# Patient Record
Sex: Male | Born: 1935
Health system: Southern US, Community
[De-identification: ages and names within clinical notes are randomized; demographics above are authoritative.]

## PROBLEM LIST (undated history)

## (undated) DIAGNOSIS — K635 Polyp of colon: Secondary | ICD-10-CM

## (undated) DIAGNOSIS — K5790 Diverticulosis of intestine, part unspecified, without perforation or abscess without bleeding: Secondary | ICD-10-CM

## (undated) DIAGNOSIS — K621 Rectal polyp: Secondary | ICD-10-CM

## (undated) DIAGNOSIS — I10 Essential (primary) hypertension: Secondary | ICD-10-CM

## (undated) DIAGNOSIS — R001 Bradycardia, unspecified: Secondary | ICD-10-CM

## (undated) DIAGNOSIS — K219 Gastro-esophageal reflux disease without esophagitis: Secondary | ICD-10-CM

## (undated) DIAGNOSIS — T7840XA Allergy, unspecified, initial encounter: Secondary | ICD-10-CM

## (undated) HISTORY — PX: CATARACT EXTRACTION: SUR2

## (undated) HISTORY — PX: TONSILECTOMY, ADENOIDECTOMY, BILATERAL MYRINGOTOMY AND TUBES: SHX2538

## (undated) HISTORY — DX: Allergy, unspecified, initial encounter: T78.40XA

---

## 2006-03-05 ENCOUNTER — Ambulatory Visit: Payer: Self-pay | Admitting: Unknown Physician Specialty

## 2008-04-07 ENCOUNTER — Emergency Department: Payer: Self-pay | Admitting: Emergency Medicine

## 2010-03-01 ENCOUNTER — Ambulatory Visit: Payer: Self-pay | Admitting: Unknown Physician Specialty

## 2010-05-17 ENCOUNTER — Ambulatory Visit: Payer: Self-pay | Admitting: Unknown Physician Specialty

## 2010-07-03 ENCOUNTER — Ambulatory Visit: Payer: Self-pay | Admitting: Ophthalmology

## 2010-07-16 ENCOUNTER — Ambulatory Visit: Payer: Self-pay | Admitting: Ophthalmology

## 2010-07-30 ENCOUNTER — Ambulatory Visit: Payer: Self-pay | Admitting: Unknown Physician Specialty

## 2010-08-19 ENCOUNTER — Ambulatory Visit: Payer: Self-pay | Admitting: Unknown Physician Specialty

## 2010-12-24 ENCOUNTER — Ambulatory Visit: Payer: Self-pay | Admitting: Unknown Physician Specialty

## 2010-12-25 ENCOUNTER — Ambulatory Visit: Payer: Self-pay | Admitting: Unknown Physician Specialty

## 2014-06-23 DIAGNOSIS — I10 Essential (primary) hypertension: Secondary | ICD-10-CM | POA: Insufficient documentation

## 2014-06-23 DIAGNOSIS — G4733 Obstructive sleep apnea (adult) (pediatric): Secondary | ICD-10-CM | POA: Insufficient documentation

## 2014-06-23 DIAGNOSIS — N400 Enlarged prostate without lower urinary tract symptoms: Secondary | ICD-10-CM | POA: Insufficient documentation

## 2014-06-23 DIAGNOSIS — Z9989 Dependence on other enabling machines and devices: Secondary | ICD-10-CM

## 2014-06-23 DIAGNOSIS — K219 Gastro-esophageal reflux disease without esophagitis: Secondary | ICD-10-CM | POA: Insufficient documentation

## 2014-06-23 HISTORY — DX: Essential (primary) hypertension: I10

## 2014-11-14 ENCOUNTER — Ambulatory Visit: Payer: Self-pay | Admitting: Ophthalmology

## 2014-11-21 ENCOUNTER — Ambulatory Visit: Payer: Self-pay | Admitting: Ophthalmology

## 2015-03-05 NOTE — Op Note (Signed)
PATIENT NAME:  Michael Gilbert, Michael Gilbert MR#:  [id number] DATE OF BIRTH:  [birth year]  DATE OF PROCEDURE:  11/21/2014  PREOPERATIVE DIAGNOSIS:  Cataract, right eye.   POSTOPERATIVE DIAGNOSIS:  Cataract, right eye.  PROCEDURE PERFORMED:  Extracapsular cataract extraction using phacoemulsification with placement of an Alcon SN60WF, 21.0-diopter posterior chamber lens, serial # [id number].  SURGEON:  Robson Trickey. Rondia Higginbotham, MD  ASSISTANT:  None.  ANESTHESIA:  4% lidocaine and 0.75% Marcaine in a 50/50 mixture with 10 units per mL of Hylenex added, given as a peribulbar.  ANESTHESIOLOGIST:  Amy M. Rice, MD  COMPLICATIONS:  None.  ESTIMATED BLOOD LOSS:  Less than 1 ml.  DESCRIPTION OF PROCEDURE:  The patient was brought to the operating room and given a peribulbar block.  The patient was then prepped and draped in the usual fashion.  The vertical rectus muscles were imbricated using 5-0 silk sutures.  These sutures were then clamped to the sterile drapes as bridle sutures.  A limbal peritomy was performed extending two clock hours and hemostasis was obtained with cautery.  A partial thickness scleral groove was made at the surgical limbus and dissected anteriorly in a lamellar dissection using an Alcon crescent knife.  The anterior chamber was entered superonasally with a Superblade and through the lamellar dissection with a 2.6 mm keratome.  DisCoVisc was used to replace the aqueous and a continuous tear capsulorrhexis was carried out.  Hydrodissection and hydrodelineation were carried out with balanced salt and a 27 gauge canula.  The nucleus was rotated to confirm the effectiveness of the hydrodissection.  Phacoemulsification was carried out using a divide-and-conquer technique.  Total ultrasound time was 2 minutes and 7 seconds with an average power of 25.9% percent. CDE of 54.42.  Irrigation/aspiration was used to remove the residual cortex.  DisCoVisc was used to inflate the capsule and the internal  incision was enlarged to 3 mm with the crescent knife.  The intraocular lens was folded and inserted into the capsular bag using the AcrySert delivery system.  Irrigation/aspiration was used to remove the residual DisCoVisc.  Miostat was injected into the anterior chamber through the paracentesis tract to inflate the anterior chamber and induce miosis.  Cefuroxime 0.1 mL containing 1 mg of drug was injected via the paracentesis tract. The wound was checked for leaks and none were found. The conjunctiva was closed with cautery and the bridle sutures were removed.  Two drops of 0.3% Vigamox were placed on the eye.   An eye shield was placed on the eye.  The patient was discharged to the recovery room in good condition.   ____________________________ Loura Back Garnetta Fedrick, MD sad:MT D: 11/21/2014 13:06:49 ET T: 11/21/2014 14:11:25 ET JOB#: [id number]  cc: Remo Lipps A. Demetric Parslow, MD, <Dictator> Martie Lee MD ELECTRONICALLY SIGNED 11/28/2014 13:11

## 2016-05-18 ENCOUNTER — Emergency Department
Admission: EM | Admit: 2016-05-18 | Discharge: 2016-05-18 | Disposition: A | Discharge status: Home / Self Care | Payer: Medicare Other | Attending: Emergency Medicine | Admitting: Emergency Medicine

## 2016-05-18 ENCOUNTER — Emergency Department: Payer: Medicare Other

## 2016-05-18 ENCOUNTER — Encounter: Payer: Self-pay | Admitting: Emergency Medicine

## 2016-05-18 DIAGNOSIS — R008 Other abnormalities of heart beat: Secondary | ICD-10-CM | POA: Insufficient documentation

## 2016-05-18 DIAGNOSIS — I10 Essential (primary) hypertension: Secondary | ICD-10-CM | POA: Diagnosis not present

## 2016-05-18 DIAGNOSIS — Z87891 Personal history of nicotine dependence: Secondary | ICD-10-CM | POA: Insufficient documentation

## 2016-05-18 DIAGNOSIS — R001 Bradycardia, unspecified: Secondary | ICD-10-CM | POA: Diagnosis present

## 2016-05-18 DIAGNOSIS — I498 Other specified cardiac arrhythmias: Secondary | ICD-10-CM

## 2016-05-18 DIAGNOSIS — I499 Cardiac arrhythmia, unspecified: Secondary | ICD-10-CM

## 2016-05-18 HISTORY — DX: Essential (primary) hypertension: I10

## 2016-05-18 LAB — CBC
HEMATOCRIT: 40.2 % (ref 40.0–52.0)
HEMOGLOBIN: 13.9 g/dL (ref 13.0–18.0)
MCH: 30.3 pg (ref 26.0–34.0)
MCHC: 34.7 g/dL (ref 32.0–36.0)
MCV: 87.4 fL (ref 80.0–100.0)
Platelets: 153 10*3/uL (ref 150–440)
RBC: 4.59 MIL/uL (ref 4.40–5.90)
RDW: 14 % (ref 11.5–14.5)
WBC: 7.9 10*3/uL (ref 3.8–10.6)

## 2016-05-18 LAB — BASIC METABOLIC PANEL
Anion gap: 9 (ref 5–15)
BUN: 31 mg/dL — ABNORMAL HIGH (ref 6–20)
CHLORIDE: 103 mmol/L (ref 101–111)
CO2: 20 mmol/L — AB (ref 22–32)
CREATININE: 1.59 mg/dL — AB (ref 0.61–1.24)
Calcium: 8.8 mg/dL — ABNORMAL LOW (ref 8.9–10.3)
GFR calc non Af Amer: 38 mL/min — ABNORMAL LOW (ref >60)
GFR, EST AFRICAN AMERICAN: 44 mL/min — AB (ref >60)
Glucose, Bld: 109 mg/dL — ABNORMAL HIGH (ref 65–99)
POTASSIUM: 4 mmol/L (ref 3.5–5.1)
SODIUM: 132 mmol/L — AB (ref 135–145)

## 2016-05-18 LAB — MAGNESIUM: MAGNESIUM: 2.1 mg/dL (ref 1.7–2.4)

## 2016-05-18 LAB — TROPONIN I

## 2016-05-18 NOTE — Discharge Instructions (Signed)
Follow-up with cardiology at [facility] Monday at [place] this week. It appears that you have a irregular heart rhythm called Bigeminy.   Bradycardia Bradycardia is a slower-than-normal heart rate. A normal resting heart rate for an adult ranges from 60 to 100 beats per minute. With bradycardia, the resting heart rate is less than 60 beats per minute. Bradycardia is a problem if your heart cannot pump enough oxygen-rich blood through your body. Bradycardia is not a problem for everyone. For some healthy adults, a slow resting heart rate is normal.  CAUSES  Bradycardia may be caused by:  A problem with the heart's electrical system, such as heart block.  A problem with the heart's natural pacemaker (sinus node).  Heart disease, damage, or infection.  Certain medicines that treat heart conditions.  Certain conditions, such as hypothyroidism and obstructive sleep apnea. RISK FACTORS  Risk factors include:  Being 71 or older.  Having high blood pressure (hypertension), high cholesterol (hyperlipidemia), or diabetes.  Drinking heavily, using tobacco products, or using drugs.  Being stressed. SIGNS AND SYMPTOMS  Signs and symptoms include:  Light-headedness.  Faintingor near fainting.  Fatigue and weakness.  Shortness of breath.  Chest pain (angina).  Drowsiness.  Confusion.  Dizziness. DIAGNOSIS  Diagnosis of bradycardia may include:  A physical exam.  An electrocardiogram (ECG).  Blood tests. TREATMENT  Treatment for bradycardia may include:  Treatment of an underlying condition.  Pacemaker placement. A pacemaker is a small, battery-powered device that is placed under the skin and is programmed to sense your heartbeats. If your heart rate is lower than the programmed rate, the pacemaker will pace your heart.  Changing your medicines or dosages. HOME CARE INSTRUCTIONS  Take medicines only as directed by your health care provider.  Manage any health  conditions that contribute to bradycardia as directed by your health care provider.  Follow a heart-healthy diet. A dietitian can help educate you on healthy food options and changes.  Follow an exercise program approved by your health care provider.  Maintain a healthy weight. Lose weight as approved by your health care provider.  Do not use tobacco products, including cigarettes, chewing tobacco, or electronic cigarettes. If you need help quitting, ask your health care provider.  Do not use illegal drugs.  Limit alcohol intake to no more than 1 drink per day for nonpregnant women and 2 drinks per day for men. One drink equals 12 ounces of beer, 5 ounces of wine, or 1 ounces of hard liquor.  Keep all follow-up visits as directed by your health care provider. This is important. SEEK MEDICAL CARE IF:  You feel light-headed or dizzy.  You almost faint.  You feel weak or are easily fatigued during physical activity.  You experience confusion or have memory problems. SEEK IMMEDIATE MEDICAL CARE IF:   You faint.  You have an irregular heartbeat.  You have chest pain.  You have trouble breathing. MAKE SURE YOU:   Understand these instructions.  Will watch your condition.  Will get help right away if you are not doing well or get worse.   This information is not intended to replace advice given to you by your health care provider. Make sure you discuss any questions you have with your health care provider.   Document Released: 07/13/2002 Document Revised: 11/11/2014 Document Reviewed: 01/26/2014 Elsevier Interactive Patient Education [employer].

## 2016-05-18 NOTE — ED Notes (Signed)
Today while checking pulse noted heartrate 34. Denies dizziness. States usually heartrate approx 50.

## 2016-05-18 NOTE — ED Notes (Signed)
Pt took his BP and HR this morning, as he does every morning, and noted that his HR was in the 30's, baseline is in 50-60's.  Pt reports being completely asymptomatic all day and states he has had a "normal day". PT is NSR with bigeminy on monitor in ED room

## 2016-05-18 NOTE — ED Provider Notes (Signed)
[facility] Emergency Department Provider Note  ____________________________________________  Time seen: Approximately 1:25 PM  I have reviewed the triage vital signs and the nursing notes.   HISTORY  Chief Complaint Bradycardia    HPI Michael Gilbert is a 82 y.o. male reports he is very healthy except for history of high blood pressure. He recently had his blood pressure medication dose reduced by about one half.  Today while checking his normal blood pressure at home his monitor noted his heart rate was 35. He had his wife check his pulse and also found was low. He was up and it his normal activities including walking today and did not notice any symptoms at all. No chest pain, hasn't noticed his heart skipping beats, no feeling of lightheadedness, no other concerns. No shortness of breath chest pain or recent illness.  Takes no other medication. History of any heart surgery. No history of heart disease.  Past Medical History  Diagnosis Date  . Hypertension     There are no active problems to display for this patient.   History reviewed. No pertinent past surgical history.  No current outpatient prescriptions on file.  Allergies Review of patient's allergies indicates no known allergies.  No family history on file.  Social History Social History  Substance Use Topics  . Smoking status: Former Research scientist (life sciences)  . Smokeless tobacco: None  . Alcohol Use: Yes    Review of Systems Constitutional: No fever/chills Eyes: No visual changes. ENT: No sore throat. Cardiovascular: Denies chest pain. Respiratory: Denies shortness of breath. Gastrointestinal: No abdominal pain.  No nausea, no vomiting.  No diarrhea.  No constipation. Genitourinary: Negative for dysuria. Musculoskeletal: Negative for back pain. Skin: Negative for rash. Neurological: Negative for headaches, focal weakness or numbness.  10-point ROS otherwise  negative.  ____________________________________________   PHYSICAL EXAM:  VITAL SIGNS: ED Triage Vitals  Enc Vitals Group     BP 05/18/16 1306 159/62 mmHg     Pulse Rate 05/18/16 1306 39     Resp 05/18/16 1306 18     Temp 05/18/16 1306 98.1 F (36.7 C)     Temp Source 05/18/16 1306 Oral     SpO2 05/18/16 1306 97 %     Weight 05/18/16 1306 180 lb (81.647 kg)     Height 05/18/16 1306 6\' 1"  (1.854 m)     Head Cir --      Peak Flow --      Pain Score --      Pain Loc --      Pain Edu? --      Excl. in [place]? --    Constitutional: Alert and oriented. Well appearing and in no acute distress.Very pleasant, accompanied by his wife. Eyes: Conjunctivae are normal. PERRL. EOMI. Head: Atraumatic. Nose: No congestion/rhinnorhea. Mouth/Throat: Mucous membranes are moist.  Oropharynx non-erythematous. Neck: No stridor.   Cardiovascular: Normal rate, regular rhythm. Grossly normal heart sounds.  Good peripheral circulation. Respiratory: Normal respiratory effort.  No retractions. Lungs CTAB. Gastrointestinal: Soft and nontender. No distention.Musculoskeletal: No lower extremity tenderness nor edema.  No joint effusions. Neurologic:  Normal speech and language. No gross focal neurologic deficits are appreciated. Skin:  Skin is warm, dry and intact. No rash noted. Psychiatric: Mood and affect are normal. Speech and behavior are normal.  ____________________________________________   LABS (all labs ordered are listed, but only abnormal results are displayed)  Labs Reviewed  BASIC METABOLIC PANEL - Abnormal; Notable for the following:    Sodium  132 (*)    CO2 20 (*)    Glucose, Bld 109 (*)    BUN 31 (*)    Creatinine, Ser 1.59 (*)    Calcium 8.8 (*)    GFR calc non Af Amer 38 (*)    GFR calc Af Amer 44 (*)    All other components within normal limits  TROPONIN I  CBC  MAGNESIUM   ____________________________________________  EKG  Reviewed and interpreted by me at  1320 Electrical heart rate 85, palpable rate 40 PR 150 QTc 400 Bigeminy, normal complexes appear with normal T waves and no evidence of ischemic abnormality noted. The patient is noted to be in obvious bigeminy.  While on telemetry the patient is noted to go intermittently in and out from normal sinus rhythm to bigeminy pattern. He appears to be completely asymptomatic during these periods however. ____________________________________________  RADIOLOGY  DG Chest 2 View (Final result) Result time: 05/18/16 14:18:17   Final result by Rad Results In Interface (05/18/16 14:18:17)   Narrative:   CLINICAL DATA: Bradycardia  EXAM: CHEST 2 VIEW  COMPARISON: 05/17/2010  FINDINGS: Normal heart size and vascularity. Mild hyperinflation and parenchymal scarring, suspect a component of COPD/ emphysema. No focal pneumonia, collapse or consolidation. Small sub cm calcified granuloma suspected in the left lower lobe retrocardiac area. Negative for edema, effusion or pneumothorax. Trachea is midline. Degenerative changes of the thoracic spine and atherosclerosis of the thoracic aorta. Bones are osteopenic.  IMPRESSION: Stable chest exam. No acute process.  Thoracic aortic atherosclerosis.   Electronically Signed By: Jerilynn Mages. Shick M.D. On: 05/18/2016 14:18    ____________________________________________   PROCEDURES  Procedure(s) performed: None  Critical Care performed: No  ____________________________________________   INITIAL IMPRESSION / ASSESSMENT AND PLAN / ED COURSE  Pertinent labs & imaging results that were available during my care of the patient were reviewed by me and considered in my medical decision making (see chart for details).  Patient presents with asymptomatic bradycardia. Noted to have occasional bigeminy. Otherwise in normal sinus rhythm for the majority of his stay, but does have occasional episodes with bigeminy lasting about 30 seconds to a  minute. He has no asystolic pauses, and he is completely asymptomatic. He has no chest pain or pulmonary symptoms. I discussed with Dr. Nehemiah Massed of cardiology, he advises the patient should be discharged and he will see him at 4 PM on Monday. I think this is very reasonable, and discussed with the patient who is also in agreement with this plan.  Labs and electrolytes unremarkable aside for some mild renal insufficiency which reviewing patient's labs in care everywhere appears to be a chronic issue without acute abnormality.  ----------------------------------------- 2:25 PM on 05/18/2016 -----------------------------------------  Return precautions and treatment recommendations and follow-up discussed with the patient who is agreeable with the plan. The patient agreeable, also recommended he not drive until he is seen in cardiology on Monday. Patient agreeable, understands the plan is follow up at 4 PM Monday with cardiology clinic Dr. Nehemiah Massed.  ____________________________________________   FINAL CLINICAL IMPRESSION(S) / ED DIAGNOSES  Final diagnoses:  Bigeminal rhythm      Delman Kitten, MD 05/18/16 2112

## 2016-08-30 DIAGNOSIS — R001 Bradycardia, unspecified: Secondary | ICD-10-CM | POA: Insufficient documentation

## 2016-08-30 DIAGNOSIS — I493 Ventricular premature depolarization: Secondary | ICD-10-CM | POA: Insufficient documentation

## 2017-10-06 ENCOUNTER — Ambulatory Visit (INDEPENDENT_AMBULATORY_CARE_PROVIDER_SITE_OTHER): Payer: Medicare Other | Admitting: Urology

## 2017-10-06 ENCOUNTER — Encounter: Payer: Self-pay | Admitting: Urology

## 2017-10-06 VITALS — BP 179/87 | HR 71 | Ht 73.0 in | Wt 187.4 lb

## 2017-10-06 DIAGNOSIS — R350 Frequency of micturition: Secondary | ICD-10-CM

## 2017-10-06 LAB — URINALYSIS, COMPLETE
BILIRUBIN UA: NEGATIVE
GLUCOSE, UA: NEGATIVE
Ketones, UA: NEGATIVE
Leukocytes, UA: NEGATIVE
Nitrite, UA: NEGATIVE
PROTEIN UA: NEGATIVE
RBC, UA: NEGATIVE
SPEC GRAV UA: 1.01 (ref 1.005–1.030)
Urobilinogen, Ur: 0.2 mg/dL (ref 0.2–1.0)
pH, UA: 5.5 (ref 5.0–7.5)

## 2017-10-06 LAB — BLADDER SCAN AMB NON-IMAGING: SCAN RESULT: 59

## 2017-10-06 MED ORDER — MIRABEGRON ER 50 MG PO TB24: 50.0000 mg | ORAL_TABLET | Freq: Every day | ORAL | 11 refills | Status: DC

## 2017-10-06 NOTE — Progress Notes (Signed)
10/06/2017 9:08 AM   Michael Gilbert May 12, 1928 [id number]  Referring provider: Derinda Late, MD [phone] [facility] [place], [place] [address]  Chief Complaint  Patient presents with  . Urinary Frequency    HPI: I was consulted to assess the patient's frequent urgent bladder.  He can void every 20 minutes in the morning.  He will void every 1 or 2 hours depending on fluid intake in the afternoon.  He has urgency.  He has a little bit of urge incontinence almost every day but does not wear a pad.  He gets up once or twice at night.  His flow is good but he stops and starts and does feel empty.  He is on Flomax  He has not had previous kidney stones or GU surgery.  He has no neurologic issues.  His bowel movements are normal  Modifying factors: There are no other modifying factors  Associated signs and symptoms: There are no other associated signs and symptoms Aggravating and relieving factors: There are no other aggravating or relieving factors Severity: Moderate Duration: Persistent   PMH: Past Medical History:  Diagnosis Date  . Allergy   . Hypertension     Surgical History: Past Surgical History:  Procedure Laterality Date  . CATARACT EXTRACTION    . TONSILECTOMY, ADENOIDECTOMY, BILATERAL MYRINGOTOMY AND TUBES      Home Medications:  Allergies as of 10/06/2017      Reactions   Ace Inhibitors Cough   Lovastatin    Other reaction(s): Muscle Pain   Simvastatin    Other reaction(s): Muscle Pain      Medication List        Accurate as of 10/06/17  9:08 AM. Always use your most recent med list.          acetaminophen 325 MG tablet Commonly known as:  TYLENOL Take 650 mg by mouth every 6 (six) hours as needed.   aspirin EC 81 MG tablet Take 2 tablets by mouth once daily.   fluticasone 50 MCG/ACT nasal spray Commonly known as:  FLONASE   loratadine 10 MG tablet Commonly known as:  CLARITIN Take by mouth.     losartan 50 MG tablet Commonly known as:  COZAAR Take 50 mg by mouth daily.   mirabegron ER 50 MG Tb24 tablet Commonly known as:  MYRBETRIQ Take 1 tablet (50 mg total) by mouth daily.   MULTI-VITAMINS Tabs Take by mouth.   omeprazole 40 MG capsule Commonly known as:  PRILOSEC   tamsulosin 0.4 MG Caps capsule Commonly known as:  FLOMAX       Allergies:  Allergies  Allergen Reactions  . Ace Inhibitors Cough  . Lovastatin     Other reaction(s): Muscle Pain  . Simvastatin     Other reaction(s): Muscle Pain    Family History: Family History  Problem Relation Age of Onset  . Bladder Cancer Neg Hx   . Kidney cancer Neg Hx   . Prostate cancer Neg Hx     Social History:  reports that he has quit smoking. he has never used smokeless tobacco. He reports that he drinks alcohol. He reports that he does not use drugs.  ROS: UROLOGY Frequent Urination?: Yes Hard to postpone urination?: Yes Burning/pain with urination?: No Get up at night to urinate?: Yes Leakage of urine?: Yes Urine stream starts and stops?: Yes Trouble starting stream?: No Do you have to strain to urinate?: No Blood in urine?:  No Urinary tract infection?: No Sexually transmitted disease?: No Injury to kidneys or bladder?: No Painful intercourse?: No Weak stream?: Yes Erection problems?: No Penile pain?: No  Gastrointestinal Nausea?: No Vomiting?: No Indigestion/heartburn?: No Diarrhea?: Yes Constipation?: No  Constitutional Fever: No Night sweats?: No Weight loss?: No Fatigue?: No  Skin Skin rash/lesions?: No Itching?: No  Eyes Blurred vision?: No Double vision?: No  Ears/Nose/Throat Sore throat?: No Sinus problems?: No  Hematologic/Lymphatic Swollen glands?: No Easy bruising?: Yes  Cardiovascular Leg swelling?: No Chest pain?: No  Respiratory Cough?: No Shortness of breath?: Yes  Endocrine Excessive thirst?: No  Musculoskeletal Back pain?: No Joint pain?:  Yes  Neurological Headaches?: No Dizziness?: No  Psychologic Depression?: No Anxiety?: No  Physical Exam: BP (!) 179/87 (BP Location: Right Arm, Patient Position: Sitting, Cuff Size: Normal)   Pulse 71   Ht 6\' 1"  (1.854 m)   Wt 187 lb 6.4 oz (85 kg)   BMI 24.72 kg/m   Constitutional:  Alert and oriented, No acute distress. HEENT: [place] AT, moist mucus membranes.  Trachea midline, no masses. Cardiovascular: No clubbing, cyanosis, or edema. Respiratory: Normal respiratory effort, no increased work of breathing. GI: Abdomen is soft, nontender, nondistended, no abdominal masses GU: 50-60 gm benign prostate Skin: No rashes, bruises or suspicious lesions. Lymph: No cervical or inguinal adenopathy. Neurologic: Grossly intact, no focal deficits, moving all 4 extremities. Psychiatric: Normal mood and affect.  Laboratory Data: Lab Results  Component Value Date   WBC 7.9 05/18/2016   HGB 13.9 05/18/2016   HCT 40.2 05/18/2016   MCV 87.4 05/18/2016   PLT 153 05/18/2016    Lab Results  Component Value Date   CREATININE 1.59 (H) 05/18/2016    No results found for: PSA  No results found for: TESTOSTERONE  No results found for: HGBA1C  Urinalysis No results found for: COLORURINE, APPEARANCEUR, LABSPEC, PHURINE, GLUCOSEU, HGBUR, BILIRUBINUR, KETONESUR, PROTEINUR, UROBILINOGEN, NITRITE, LEUKOCYTESUR  Pertinent Imaging: None  Assessment & Plan: The patient has frequency and urgency.  The role of Myrbetriq was discussed.  Reassess on Flomax and Myrbetriq 50 mg in 4-6 weeks  1. Urinary frequency 2.  Urgency incontinence - Urinalysis, Complete - Bladder Scan (Post Void Residual) in office   Return in about 5 weeks (around 11/10/2017).  Reece Packer, MD  [facility] [address], [place], [place] [address] [phone]

## 2017-10-31 ENCOUNTER — Telehealth: Payer: Self-pay

## 2017-10-31 NOTE — Telephone Encounter (Signed)
PA for myrbetriq has been APPROVED x1 year.

## 2017-11-17 ENCOUNTER — Encounter: Payer: Self-pay | Admitting: Urology

## 2017-11-17 ENCOUNTER — Ambulatory Visit (INDEPENDENT_AMBULATORY_CARE_PROVIDER_SITE_OTHER): Payer: Medicare Other | Admitting: Urology

## 2017-11-17 VITALS — BP 151/71 | HR 67 | Ht 72.0 in | Wt 180.0 lb

## 2017-11-17 DIAGNOSIS — R350 Frequency of micturition: Secondary | ICD-10-CM

## 2017-11-17 MED ORDER — TOLTERODINE TARTRATE ER 4 MG PO CP24
4.0000 mg | ORAL_CAPSULE | Freq: Every day | ORAL | 11 refills | Status: DC
Start: 2017-11-17 — End: 2017-12-15

## 2017-11-17 MED ORDER — OXYBUTYNIN CHLORIDE ER 10 MG PO TB24: 10.0000 mg | ORAL_TABLET | Freq: Every day | ORAL | 11 refills | Status: DC

## 2017-11-17 NOTE — Progress Notes (Signed)
11/17/2017 9:44 AM   Michael Gilbert [birth year] [id number]  Referring provider: Derinda Late, MD [id number] [facility] [facility] [place], [place] [address]  Chief Complaint  Patient presents with  . Urinary Frequency  . Follow-up    HPI: I was consulted to assess the patient's frequent urgent bladder.  He can void every 20 minutes in the morning.  He will void every 1 or 2 hours depending on fluid intake in the afternoon.  He has urgency.  He has a little bit of urge incontinence almost every day but does not wear a pad.  He gets up once or twice at night.  His flow is good but he stops and starts and does feel empty.  He is on Flomax  Today   PMH: Past Medical History:  Diagnosis Date  . Allergy   . Hypertension     Surgical History: Past Surgical History:  Procedure Laterality Date  . CATARACT EXTRACTION    . TONSILECTOMY, ADENOIDECTOMY, BILATERAL MYRINGOTOMY AND TUBES      Home Medications:  Allergies as of 11/17/2017      Reactions   Ace Inhibitors Cough   Lovastatin    Other reaction(s): Muscle Pain   Simvastatin    Other reaction(s): Muscle Pain      Medication List        Accurate as of 11/17/17  9:44 AM. Always use your most recent med list.          acetaminophen 325 MG tablet Commonly known as:  TYLENOL Take 650 mg by mouth every 6 (six) hours as needed.   aspirin EC 81 MG tablet Take 2 tablets by mouth once daily.   fluticasone 50 MCG/ACT nasal spray Commonly known as:  FLONASE   loratadine 10 MG tablet Commonly known as:  CLARITIN Take by mouth.   losartan 50 MG tablet Commonly known as:  COZAAR Take 50 mg by mouth daily.   mirabegron ER 50 MG Tb24 tablet Commonly known as:  MYRBETRIQ Take 1 tablet (50 mg total) by mouth daily.   MULTI-VITAMINS Tabs Take by mouth.   omeprazole 40 MG capsule Commonly known as:  PRILOSEC   tamsulosin 0.4 MG Caps capsule Commonly known as:  FLOMAX        Allergies:  Allergies  Allergen Reactions  . Ace Inhibitors Cough  . Lovastatin     Other reaction(s): Muscle Pain  . Simvastatin     Other reaction(s): Muscle Pain    Family History: Family History  Problem Relation Age of Onset  . Bladder Cancer Neg Hx   . Kidney cancer Neg Hx   . Prostate cancer Neg Hx     Social History:  reports that he has quit smoking. he has never used smokeless tobacco. He reports that he drinks alcohol. He reports that he does not use drugs.  ROS: UROLOGY Frequent Urination?: Yes Hard to postpone urination?: No Burning/pain with urination?: No Get up at night to urinate?: Yes Leakage of urine?: No Urine stream starts and stops?: Yes Trouble starting stream?: No Do you have to strain to urinate?: No Blood in urine?: No Urinary tract infection?: No Sexually transmitted disease?: No Injury to kidneys or bladder?: No Painful intercourse?: No Weak stream?: No Erection problems?: No Penile pain?: No  Gastrointestinal Nausea?: No Vomiting?: No Indigestion/heartburn?: No Diarrhea?: No Constipation?: No  Constitutional Fever: No Night sweats?: No Weight loss?: No Fatigue?: No  Skin Skin rash/lesions?: No Itching?:  No  Eyes Blurred vision?: No Double vision?: No  Ears/Nose/Throat Sore throat?: No Sinus problems?: No  Hematologic/Lymphatic Swollen glands?: No Easy bruising?: No  Cardiovascular Leg swelling?: No Chest pain?: No  Respiratory Cough?: No Shortness of breath?: No  Endocrine Excessive thirst?: No  Musculoskeletal Back pain?: No Joint pain?: No  Neurological Headaches?: No Dizziness?: No  Psychologic Depression?: No Anxiety?: No  Physical Exam: BP (!) 151/71   Pulse 67   Ht 6' (1.829 m)   Wt 180 lb (81.6 kg)   BMI 24.41 kg/m   Constitutional:  Alert and oriented, No acute distress.   Laboratory Data: Lab Results  Component Value Date   WBC 7.9 05/18/2016   HGB 13.9 05/18/2016    HCT 40.2 05/18/2016   MCV 87.4 05/18/2016   PLT 153 05/18/2016    Lab Results  Component Value Date   CREATININE 1.59 (H) 05/18/2016    No results found for: PSA  No results found for: TESTOSTERONE  No results found for: HGBA1C  Urinalysis    Component Value Date/Time   APPEARANCEUR Clear 10/06/2017 0843   GLUCOSEU Negative 10/06/2017 0843   BILIRUBINUR Negative 10/06/2017 0843   PROTEINUR Negative 10/06/2017 0843   NITRITE Negative 10/06/2017 0843   LEUKOCYTESUR Negative 10/06/2017 0843    Pertinent Imaging:   Assessment & Plan: The patient still has frequency and urgency.  He will stop the medication.  He still is on Flomax.  I gave him 2 prescriptions one of Detrol LA and 1 of oxybutynin extended release 10 mg.  I will reassess him in 8 weeks.  Percutaneous tibial nerve stimulation as an option.  There are no diagnoses linked to this encounter.  No Follow-up on file.  Reece Packer, MD  [facility] [address] [place], [place] [address] [phone]

## 2017-12-05 ENCOUNTER — Encounter: Payer: Self-pay | Admitting: Emergency Medicine

## 2017-12-05 ENCOUNTER — Inpatient Hospital Stay
Admission: EM | Admit: 2017-12-05 | Discharge: 2017-12-08 | DRG: 378 | Disposition: A | Discharge status: Home / Self Care | Payer: Medicare Other | Attending: Internal Medicine | Admitting: Internal Medicine

## 2017-12-05 ENCOUNTER — Inpatient Hospital Stay: Payer: Medicare Other

## 2017-12-05 ENCOUNTER — Other Ambulatory Visit: Payer: Self-pay

## 2017-12-05 DIAGNOSIS — K648 Other hemorrhoids: Secondary | ICD-10-CM | POA: Diagnosis present

## 2017-12-05 DIAGNOSIS — T39015A Adverse effect of aspirin, initial encounter: Secondary | ICD-10-CM | POA: Diagnosis present

## 2017-12-05 DIAGNOSIS — E861 Hypovolemia: Secondary | ICD-10-CM | POA: Diagnosis present

## 2017-12-05 DIAGNOSIS — D123 Benign neoplasm of transverse colon: Secondary | ICD-10-CM | POA: Diagnosis present

## 2017-12-05 DIAGNOSIS — D62 Acute posthemorrhagic anemia: Secondary | ICD-10-CM | POA: Diagnosis not present

## 2017-12-05 DIAGNOSIS — R338 Other retention of urine: Secondary | ICD-10-CM | POA: Diagnosis present

## 2017-12-05 DIAGNOSIS — K922 Gastrointestinal hemorrhage, unspecified: Secondary | ICD-10-CM | POA: Diagnosis present

## 2017-12-05 DIAGNOSIS — Z7982 Long term (current) use of aspirin: Secondary | ICD-10-CM | POA: Diagnosis not present

## 2017-12-05 DIAGNOSIS — K5731 Diverticulosis of large intestine without perforation or abscess with bleeding: Principal | ICD-10-CM | POA: Diagnosis present

## 2017-12-05 DIAGNOSIS — D691 Qualitative platelet defects: Secondary | ICD-10-CM | POA: Diagnosis present

## 2017-12-05 DIAGNOSIS — R140 Abdominal distension (gaseous): Secondary | ICD-10-CM | POA: Diagnosis present

## 2017-12-05 DIAGNOSIS — E876 Hypokalemia: Secondary | ICD-10-CM | POA: Diagnosis not present

## 2017-12-05 DIAGNOSIS — Y92009 Unspecified place in unspecified non-institutional (private) residence as the place of occurrence of the external cause: Secondary | ICD-10-CM

## 2017-12-05 DIAGNOSIS — Z87891 Personal history of nicotine dependence: Secondary | ICD-10-CM

## 2017-12-05 DIAGNOSIS — K625 Hemorrhage of anus and rectum: Secondary | ICD-10-CM

## 2017-12-05 DIAGNOSIS — K589 Irritable bowel syndrome without diarrhea: Secondary | ICD-10-CM | POA: Diagnosis present

## 2017-12-05 DIAGNOSIS — R001 Bradycardia, unspecified: Secondary | ICD-10-CM | POA: Diagnosis present

## 2017-12-05 DIAGNOSIS — Z888 Allergy status to other drugs, medicaments and biological substances status: Secondary | ICD-10-CM | POA: Diagnosis not present

## 2017-12-05 DIAGNOSIS — Z833 Family history of diabetes mellitus: Secondary | ICD-10-CM | POA: Diagnosis not present

## 2017-12-05 DIAGNOSIS — K5730 Diverticulosis of large intestine without perforation or abscess without bleeding: Secondary | ICD-10-CM | POA: Diagnosis not present

## 2017-12-05 DIAGNOSIS — I959 Hypotension, unspecified: Secondary | ICD-10-CM | POA: Diagnosis present

## 2017-12-05 DIAGNOSIS — K5791 Diverticulosis of intestine, part unspecified, without perforation or abscess with bleeding: Secondary | ICD-10-CM | POA: Diagnosis not present

## 2017-12-05 DIAGNOSIS — I10 Essential (primary) hypertension: Secondary | ICD-10-CM | POA: Diagnosis present

## 2017-12-05 DIAGNOSIS — I440 Atrioventricular block, first degree: Secondary | ICD-10-CM | POA: Diagnosis present

## 2017-12-05 DIAGNOSIS — N401 Enlarged prostate with lower urinary tract symptoms: Secondary | ICD-10-CM | POA: Diagnosis present

## 2017-12-05 DIAGNOSIS — Z79899 Other long term (current) drug therapy: Secondary | ICD-10-CM | POA: Diagnosis not present

## 2017-12-05 HISTORY — DX: Rectal polyp: K62.1

## 2017-12-05 HISTORY — DX: Polyp of colon: K63.5

## 2017-12-05 HISTORY — DX: Diverticulosis of intestine, part unspecified, without perforation or abscess without bleeding: K57.90

## 2017-12-05 HISTORY — DX: Bradycardia, unspecified: R00.1

## 2017-12-05 LAB — COMPREHENSIVE METABOLIC PANEL
ALBUMIN: 3.5 g/dL (ref 3.5–5.0)
ALK PHOS: 64 U/L (ref 38–126)
ALT: 24 U/L (ref 17–63)
AST: 69 U/L — AB (ref 15–41)
Anion gap: 10 (ref 5–15)
BILIRUBIN TOTAL: 0.7 mg/dL (ref 0.3–1.2)
BUN: 31 mg/dL — ABNORMAL HIGH (ref 6–20)
CO2: 22 mmol/L (ref 22–32)
CREATININE: 1.38 mg/dL — AB (ref 0.61–1.24)
Calcium: 8.7 mg/dL — ABNORMAL LOW (ref 8.9–10.3)
Chloride: 104 mmol/L (ref 101–111)
GFR calc Af Amer: 51 mL/min — ABNORMAL LOW (ref >60)
GFR calc non Af Amer: 44 mL/min — ABNORMAL LOW (ref >60)
GLUCOSE: 107 mg/dL — AB (ref 65–99)
POTASSIUM: 4.4 mmol/L (ref 3.5–5.1)
Sodium: 136 mmol/L (ref 135–145)
TOTAL PROTEIN: 7.3 g/dL (ref 6.5–8.1)

## 2017-12-05 LAB — ABO/RH: ABO/RH(D): O POS

## 2017-12-05 LAB — HEMOGLOBIN
HEMOGLOBIN: 11.9 g/dL — AB (ref 13.0–18.0)
Hemoglobin: 10.5 g/dL — ABNORMAL LOW (ref 13.0–18.0)
Hemoglobin: 10.5 g/dL — ABNORMAL LOW (ref 13.0–18.0)

## 2017-12-05 LAB — TROPONIN I: Troponin I: <0.03 ng/mL (ref <0.03)

## 2017-12-05 LAB — CBC
HEMATOCRIT: 36.8 % — AB (ref 40.0–52.0)
Hemoglobin: 12.6 g/dL — ABNORMAL LOW (ref 13.0–18.0)
MCH: 30.8 pg (ref 26.0–34.0)
MCHC: 34.2 g/dL (ref 32.0–36.0)
MCV: 90 fL (ref 80.0–100.0)
PLATELETS: 177 10*3/uL (ref 150–440)
RBC: 4.09 MIL/uL — ABNORMAL LOW (ref 4.40–5.90)
RDW: 14.7 % — AB (ref 11.5–14.5)
WBC: 6.8 10*3/uL (ref 3.8–10.6)

## 2017-12-05 LAB — GLUCOSE, CAPILLARY: GLUCOSE-CAPILLARY: 110 mg/dL — AB (ref 65–99)

## 2017-12-05 LAB — PROTIME-INR
INR: 1.01
INR: 1.06
PROTHROMBIN TIME: 13.7 s (ref 11.4–15.2)
Prothrombin Time: 13.2 seconds (ref 11.4–15.2)

## 2017-12-05 LAB — MRSA PCR SCREENING: MRSA BY PCR: NEGATIVE

## 2017-12-05 LAB — PREPARE RBC (CROSSMATCH)

## 2017-12-05 MED ORDER — SODIUM CHLORIDE 0.9 % IV SOLN
10.0000 mL/h | Freq: Once | INTRAVENOUS | Status: AC
  Administered 2017-12-05: 10 mL/h via INTRAVENOUS

## 2017-12-05 MED ORDER — TECHNETIUM TC 99M-LABELED RED BLOOD CELLS IV KIT
20.0000 | PACK | Freq: Once | INTRAVENOUS | Status: AC | PRN
  Administered 2017-12-05: 21.832 via INTRAVENOUS

## 2017-12-05 MED ORDER — SODIUM CHLORIDE 0.9 % IV BOLUS (SEPSIS): 500.0000 mL | Freq: Once | INTRAVENOUS | Status: DC | PRN

## 2017-12-05 MED ORDER — TAMSULOSIN HCL 0.4 MG PO CAPS
0.4000 mg | ORAL_CAPSULE | Freq: Every day | ORAL | Status: DC
  Administered 2017-12-06 – 2017-12-07 (×2): 0.4 mg via ORAL
  Filled 2017-12-05 (×2): qty 1

## 2017-12-05 MED ORDER — LORATADINE 10 MG PO TABS: 10.0000 mg | ORAL_TABLET | Freq: Every day | ORAL | Status: DC | PRN

## 2017-12-05 MED ORDER — PEG 3350-KCL-NA BICARB-NACL 420 G PO SOLR
4000.0000 mL | Freq: Once | ORAL | Status: AC
  Administered 2017-12-06: 4000 mL via ORAL
  Filled 2017-12-05: qty 4000

## 2017-12-05 MED ORDER — PANTOPRAZOLE SODIUM 40 MG IV SOLR
40.0000 mg | Freq: Two times a day (BID) | INTRAVENOUS | Status: DC
  Administered 2017-12-05 – 2017-12-07 (×4): 40 mg via INTRAVENOUS
  Filled 2017-12-05 (×4): qty 40

## 2017-12-05 MED ORDER — DOCUSATE SODIUM 100 MG PO CAPS
100.0000 mg | ORAL_CAPSULE | Freq: Two times a day (BID) | ORAL | Status: DC | PRN
  Administered 2017-12-08: 100 mg via ORAL
  Filled 2017-12-05: qty 1

## 2017-12-05 MED ORDER — IOPAMIDOL (ISOVUE-300) INJECTION 61%: 30.0000 mL | Freq: Once | INTRAVENOUS | Status: DC

## 2017-12-05 MED ORDER — SODIUM CHLORIDE 0.9 % IV BOLUS (SEPSIS)
500.0000 mL | Freq: Once | INTRAVENOUS | Status: AC
  Administered 2017-12-05: 500 mL via INTRAVENOUS

## 2017-12-05 MED ORDER — FESOTERODINE FUMARATE ER 4 MG PO TB24
4.0000 mg | ORAL_TABLET | Freq: Every day | ORAL | Status: DC
  Filled 2017-12-05: qty 1

## 2017-12-05 MED ORDER — PANTOPRAZOLE SODIUM 40 MG PO TBEC: 40.0000 mg | DELAYED_RELEASE_TABLET | Freq: Two times a day (BID) | ORAL | Status: DC

## 2017-12-05 MED ORDER — SODIUM CHLORIDE 0.9 % IV SOLN: Freq: Once | INTRAVENOUS | Status: DC

## 2017-12-05 MED ORDER — FLUTICASONE PROPIONATE 50 MCG/ACT NA SUSP
1.0000 | Freq: Every day | NASAL | Status: DC
  Filled 2017-12-05 (×2): qty 16

## 2017-12-05 MED ORDER — SODIUM CHLORIDE 0.9 % IV BOLUS (SEPSIS)
1000.0000 mL | Freq: Once | INTRAVENOUS | Status: AC
  Administered 2017-12-05: 1000 mL via INTRAVENOUS

## 2017-12-05 MED ORDER — SODIUM CHLORIDE 0.9 % IV SOLN
INTRAVENOUS | Status: DC
  Administered 2017-12-05 – 2017-12-06 (×5): via INTRAVENOUS
  Administered 2017-12-06: 1000 mL via INTRAVENOUS
  Administered 2017-12-06 – 2017-12-07 (×2): via INTRAVENOUS

## 2017-12-05 NOTE — Progress Notes (Signed)
Dr. Anselm Jungling at bedside during RRT to assess patient-Per Dr. Marthann Schiller, patient to be transferred to ICU on stepdown status for closer monitoring. 500 ml bolus and EKG completed. No inpatient orders received at this time for transfusion of blood, awaiting assessment from GI MD. Spouse at bedside and updated on the need to transfer patient to stepdown. Support provided to both patient and spouse. Patient transferred via bed with IV Fluids infusing to ICU bed one with ICU Charge RN, Judson Roch. Britney RN, given bedside report in the unit and care transferred.

## 2017-12-05 NOTE — H&P (Signed)
[facility] [facility] [place] [facility] NAME: Michael Gilbert    MR#:  [id number]  DATE OF BIRTH:  1928-10-21  DATE OF ADMISSION:  12/05/2017  PRIMARY CARE PHYSICIAN: Derinda Late, MD   REQUESTING/REFERRING PHYSICIAN: Mc[place]ane  CHIEF COMPLAINT:   Chief Complaint  Patient presents with  . GI Bleeding    HISTORY OF PRESENT ILLNESS: Michael Gilbert  is a 84 y.o. male with a known history of bradycardia, hypertension, takes aspirin every day at home. Has some history of IBS. Today morning he had 4 bowel movement in short period of time which were consisting of dark red blood, no associated vomiting or abdominal pain. Yesterday's bowel movement was normal. In the emergency room he is noted to have very low blood pressure on arrival, his hemoglobin is still normal, but due to low blood pressure ER physician started on IV fluid bolus which helped to stabilize the blood pressure and he has also ordered a stat blood transfusion. He was using 3 tablets of Tylenol daily until 2 weeks ago because of some joint pains. Denies using NSAIDs for BC powders. Last 2 weeks he did not use any Tylenol. His last colonoscopy was at age of 64 which was normal and after that his physician said he don't need 1.  PAST MEDICAL HISTORY:   Past Medical History:  Diagnosis Date  . Allergy   . Bradycardia   . Hypertension     PAST SURGICAL HISTORY:  Past Surgical History:  Procedure Laterality Date  . CATARACT EXTRACTION    . TONSILECTOMY, ADENOIDECTOMY, BILATERAL MYRINGOTOMY AND TUBES      SOCIAL HISTORY:  Social History   Tobacco Use  . Smoking status: Former Research scientist (life sciences)  . Smokeless tobacco: Never Used  Substance Use Topics  . Alcohol use: Yes    FAMILY HISTORY:  Family History  Problem Relation Age of Onset  . Diabetes Father   . Diabetes Brother   . Bladder Cancer Neg Hx   . Kidney cancer Neg Hx   . Prostate cancer Neg Hx     DRUG ALLERGIES:  Allergies  Allergen Reactions   . Ace Inhibitors Cough  . Lovastatin     Other reaction(s): Muscle Pain  . Simvastatin     Other reaction(s): Muscle Pain    REVIEW OF SYSTEMS:   CONSTITUTIONAL: No fever, fatigue or weakness.  EYES: No blurred or double vision.  EARS, NOSE, AND THROAT: No tinnitus or ear pain.  RESPIRATORY: No cough, shortness of breath, wheezing or hemoptysis.  CARDIOVASCULAR: No chest pain, orthopnea, edema.  GASTROINTESTINAL: No nausea, vomiting, diarrhea or abdominal pain.  GENITOURINARY: No dysuria, hematuria.  ENDOCRINE: No polyuria, nocturia,  HEMATOLOGY: No anemia, easy bruising or bleeding SKIN: No rash or lesion. MUSCULOSKELETAL: No joint pain or arthritis.   NEUROLOGIC: No tingling, numbness, weakness.  PSYCHIATRY: No anxiety or depression.   MEDICATIONS AT HOME:  Prior to Admission medications   Medication Sig Start Date End Date Taking? Authorizing Provider  aspirin EC 81 MG tablet Take 2 tablets by mouth once daily.   Yes [provider]  losartan (COZAAR) 25 MG tablet Take 25 mg by mouth daily.    Yes [provider]  Multiple Vitamin (MULTI-VITAMINS) TABS Take 1 tablet by mouth daily.    Yes [provider]  omeprazole (PRILOSEC) 40 MG capsule Take 40 mg by mouth daily.  08/02/17  Yes [provider]  tamsulosin (FLOMAX) 0.4 MG CAPS capsule Take 0.4 mg by  mouth daily.  10/04/17  Yes [provider]  Acetaminophen 500 MG coapsule Take 5,002 mg by mouth every 6 (six) hours as needed.     [provider]  fluticasone (FLONASE) 50 MCG/ACT nasal spray Place 1 spray into both nostrils daily as needed.  07/03/17   [provider]  loratadine (CLARITIN) 10 MG tablet Take 10 mg by mouth daily as needed.  12/12/16   [provider]  mirabegron ER (MYRBETRIQ) 50 MG TB24 tablet Take 1 tablet (50 mg total) by mouth daily. Patient not taking: Reported on 11/17/2017 10/06/17   Bjorn Loser, MD  oxybutynin (DITROPAN-XL) 10 MG  24 hr tablet Take 1 tablet (10 mg total) by mouth daily. 11/17/17   Bjorn Loser, MD  tolterodine (DETROL LA) 4 MG 24 hr capsule Take 1 capsule (4 mg total) by mouth daily. 11/17/17   Bjorn Loser, MD      PHYSICAL EXAMINATION:   VITAL SIGNS: Blood pressure (!) 150/83, pulse 79, temperature 97.6 F (36.4 C), temperature source Oral, resp. rate (!) 22, height 6' (1.829 m), weight 81.6 kg (180 lb), SpO2 95 %.  GENERAL:  84 y.o.-year-old patient lying in the bed with no acute distress.  EYES: Pupils equal, round, reactive to light and accommodation. No scleral icterus. Extraocular muscles intact.  HEENT: Head atraumatic, normocephalic. Oropharynx and nasopharynx clear.  NECK:  Supple, no jugular venous distention. No thyroid enlargement, no tenderness.  LUNGS: Normal breath sounds bilaterally, no wheezing, rales,rhonchi or crepitation. No use of accessory muscles of respiration.  CARDIOVASCULAR: S1, S2 normal. No murmurs, rubs, or gallops.  ABDOMEN: Soft, nontender, nondistended. Bowel sounds present. No organomegaly or mass.  EXTREMITIES: No pedal edema, cyanosis, or clubbing.  NEUROLOGIC: Cranial nerves II through XII are intact. Muscle strength 5/5 in all extremities. Sensation intact. Gait not checked.  PSYCHIATRIC: The patient is alert and oriented x 3.  SKIN: No obvious rash, lesion, or ulcer.   LABORATORY PANEL:   CBC Recent Labs  Lab 12/05/17 1003  WBC 6.8  HGB 12.6*  HCT 36.8*  PLT 177  MCV 90.0  MCH 30.8  MCHC 34.2  RDW 14.7*   ------------------------------------------------------------------------------------------------------------------  Chemistries  Recent Labs  Lab 12/05/17 1003  NA 136  K 4.4  CL 104  CO2 22  GLUCOSE 107*  BUN 31*  CREATININE 1.38*  CALCIUM 8.7*  AST 69*  ALT 24  ALKPHOS 64  BILITOT 0.7   ------------------------------------------------------------------------------------------------------------------ estimated creatinine  clearance is 41.4 mL/min (A) (by C-G formula based on SCr of 1.38 mg/dL (H)). ------------------------------------------------------------------------------------------------------------------ No results for input(s): TSH, T4TOTAL, T3FREE, THYROIDAB in the last 72 hours.  Invalid input(s): FREET3   Coagulation profile Recent Labs  Lab 12/05/17 1004  INR 1.01   ------------------------------------------------------------------------------------------------------------------- No results for input(s): DDIMER in the last 72 hours. -------------------------------------------------------------------------------------------------------------------  Cardiac Enzymes No results for input(s): CKMB, TROPONINI, MYOGLOBIN in the last 168 hours.  Invalid input(s): CK ------------------------------------------------------------------------------------------------------------------ Invalid input(s): POCBNP  ---------------------------------------------------------------------------------------------------------------  Urinalysis    Component Value Date/Time   APPEARANCEUR Clear 10/06/2017 0843   GLUCOSEU Negative 10/06/2017 0843   BILIRUBINUR Negative 10/06/2017 0843   PROTEINUR Negative 10/06/2017 0843   NITRITE Negative 10/06/2017 0843   LEUKOCYTESUR Negative 10/06/2017 0843     RADIOLOGY: No results found.  EKG: Orders placed or performed during the hospital encounter of 05/18/16  . EKG 12-Lead  . EKG 12-Lead    IMPRESSION AND PLAN:  * GI bleed   Likely lower GI due to diverticuli.  No use of NSAIDs.   Due to presentation with hypotension, blood transfusion is ordered by ER physician.   Will keep on clear liquid diet and follow serial hemoglobin and get GI consult.   Will hold aspirin.   Protonix twice a day for now.  * Hypotension   This was secondary to acute GI bleed.   Responded nicely to IV fluid bolus, continue some IV fluids now.   Hold losartan.  * History of  hypertension   As mentioned ago hold losartan.  * BPH   Continue tamsulosin.  All the records are reviewed and case discussed with ED provider. Management plans discussed with the patient, family and they are in agreement.  CODE STATUS: full. Code Status History    This patient does not have a recorded code status. Please follow your organizational policy for patients in this situation.    Advance Directive Documentation     Most Recent Value  Type of Advance Directive  Healthcare Power of Attorney, Living will  Pre-existing out of facility DNR order (yellow form or pink MOST form)  No data  "MOST" Form in Place?  No data     His wife and daughter were present in the room and I discussed the plan with them.  TOTAL TIME TAKING CARE OF THIS PATIENT: 50 minutes.    Vaughan Basta M.D on 12/05/2017   Between 7am to 6pm - Pager - [phone]  After 6pm go to [email or web address] - password [email or web address] [facility]  Office  [phone]  CC: Primary care physician; Derinda Late, MD   Note: This dictation was prepared with Dragon dictation along with smaller phrase technology. Any transcriptional errors that result from this process are unintentional.

## 2017-12-05 NOTE — Consult Note (Signed)
Lucilla Lame, MD [facility]  [address], [place], [place] [address] Phone: [phone] Fax : [phone]  Consultation  Referring Provider:     Dr. Anselm Jungling Primary Care Physician:  Derinda Late, MD Primary Gastroenterologist: Althia Forts         Reason for Consultation:     Hematochezia  Date of Admission:  12/05/2017 Date of Consultation:  12/05/2017         HPI:   Michael Gilbert is a 84 y.o. male who has a history of bradycardia hypertension and takes aspirin.  The patient also states that he has been followed in the past for IBS.  He came to the hospital because he had elements and reports that there was blood seen in his stool.  The patient had reported the blood to be bright red.  The patient's hemoglobin was above 12 on admission and he was sent to the floor.  The patient had a few more episodes of bright red blood and started to have decreased blood pressure and a rapid response was called and the patient was moved to the ICU.  The patient was given 500 cc of fluid and his blood pressure came up to 160s over high 80s.  The patient does report that he takes Antivert occasions because of some hip pain and he also reports his last colonoscopy was at 84 years old.  The patient denies any nausea vomiting or unexplained weight loss.  He also denies any fevers or chills.  Past Medical History:  Diagnosis Date  . Allergy   . Bradycardia   . Hypertension     Past Surgical History:  Procedure Laterality Date  . CATARACT EXTRACTION    . TONSILECTOMY, ADENOIDECTOMY, BILATERAL MYRINGOTOMY AND TUBES      Prior to Admission medications   Medication Sig Start Date End Date Taking? Authorizing Provider  aspirin EC 81 MG tablet Take 2 tablets by mouth once daily.   Yes [provider]  losartan (COZAAR) 25 MG tablet Take 25 mg by mouth daily.    Yes [provider]  Multiple Vitamin (MULTI-VITAMINS) TABS Take 1 tablet by mouth daily.    Yes [provider]    omeprazole (PRILOSEC) 40 MG capsule Take 40 mg by mouth daily.  08/02/17  Yes [provider]  tamsulosin (FLOMAX) 0.4 MG CAPS capsule Take 0.4 mg by mouth daily.  10/04/17  Yes [provider]  Acetaminophen 500 MG coapsule Take 5,002 mg by mouth every 6 (six) hours as needed.     [provider]  fluticasone (FLONASE) 50 MCG/ACT nasal spray Place 1 spray into both nostrils daily as needed.  07/03/17   [provider]  loratadine (CLARITIN) 10 MG tablet Take 10 mg by mouth daily as needed.  12/12/16   [provider]  mirabegron ER (MYRBETRIQ) 50 MG TB24 tablet Take 1 tablet (50 mg total) by mouth daily. Patient not taking: Reported on 11/17/2017 10/06/17   Bjorn Loser, MD  oxybutynin (DITROPAN-XL) 10 MG 24 hr tablet Take 1 tablet (10 mg total) by mouth daily. 11/17/17   Bjorn Loser, MD  tolterodine (DETROL LA) 4 MG 24 hr capsule Take 1 capsule (4 mg total) by mouth daily. 11/17/17   Bjorn Loser, MD    Family History  Problem Relation Age of Onset  . Diabetes Father   . Diabetes Brother   . Bladder Cancer Neg Hx   . Kidney cancer Neg Hx   . Prostate cancer  Neg Hx      Social History   Tobacco Use  . Smoking status: Former Research scientist (life sciences)  . Smokeless tobacco: Never Used  Substance Use Topics  . Alcohol use: Yes  . Drug use: No    Allergies as of 12/05/2017 - Review Complete 12/05/2017  Allergen Reaction Noted  . Ace inhibitors Cough 06/03/2014  . Lovastatin  06/03/2014  . Simvastatin  06/03/2014    Review of Systems:    All systems reviewed and negative except where noted in HPI.   Physical Exam:  Vital signs in last 24 hours: Temp:  [97.6 F (36.4 C)-98.5 F (36.9 C)] (P) 98.4 F (36.9 C) (02/01 2055) Pulse Rate:  [50-85] 63 (02/01 2055) Resp:  [12-26] 19 (02/01 2055) BP: (58-167)/(34-86) 131/74 (02/01 2045) SpO2:  [94 %-100 %] 97 % (02/01 2000) Weight:  [180 lb (81.6 kg)-184 lb 11.9 oz (83.8 kg)] 184 lb 11.9 oz (83.8  kg) (02/01 1629) Last BM Date: 12/05/17 General:   Pleasant, cooperative in NAD Head:  Normocephalic and atraumatic. Eyes:   No icterus.   Conjunctiva pink. PERRLA. Ears:  Normal auditory acuity. Neck:  Supple; no masses or thyroidomegaly Lungs: Respirations even and unlabored. Lungs clear to auscultation bilaterally.   No wheezes, crackles, or rhonchi.  Heart:  Regular rate and rhythm;  Without murmur, clicks, rubs or gallops Abdomen:  Soft, nondistended, nontender. Normal bowel sounds. No appreciable masses or hepatomegaly.  No rebound or guarding.  Rectal:  Not performed. Msk:  Symmetrical without gross deformities.    Extremities:  Without edema, cyanosis or clubbing. Neurologic:  Alert and oriented x3;  grossly normal neurologically. Skin:  Intact without significant lesions or rashes. Cervical Nodes:  No significant cervical adenopathy. Psych:  Alert and cooperative. Normal affect.  LAB RESULTS: Recent Labs    12/05/17 1003 12/05/17 1124 12/05/17 1528  WBC 6.8  --   --   HGB 12.6* 11.9* 10.5*  HCT 36.8*  --   --   PLT 177  --   --    BMET Recent Labs    12/05/17 1003  NA 136  K 4.4  CL 104  CO2 22  GLUCOSE 107*  BUN 31*  CREATININE 1.38*  CALCIUM 8.7*   LFT Recent Labs    12/05/17 1003  PROT 7.3  ALBUMIN 3.5  AST 69*  ALT 24  ALKPHOS 64  BILITOT 0.7   PT/INR Recent Labs    12/05/17 1004 12/05/17 1124  LABPROT 13.2 13.7  INR 1.01 1.06    STUDIES: No results found.    Impression / Plan:   Michael Gilbert is a 84 y.o. y/o male with who comes in with a lower GI bleed with a drop in hemoglobin from 12.6 on admission to 10.5 today.  Since the patient has not been prepped for colonoscopy he will be sent down for a bleeding scan.  After that the patient will be prepped for colonoscopy and set up for a colonoscopy for tomorrow.  The nursing staff had called me to see if I wanted to add octreotide or Protonix to the patient's medication.  Since there is  no report of any alcoholic liver disease I think octreotide would not be helpful.  I did tell the nurse that Protonix could be helpful if the patient was having an upper GI bleed.  The colonoscopy will be done by Dr. Marius Ditch.  The patient has been explained the plan and agrees with it.  Thank you for involving me  in the care of this patient.      LOS: 0 days   Lucilla Lame, MD  12/05/2017, 8:56 PM   Note: This dictation was prepared with Dragon dictation along with smaller phrase technology. Any transcriptional errors that result from this process are unintentional.

## 2017-12-05 NOTE — Consult Note (Signed)
[facility] Vascular Consult Note  MRN : [id number]  Michael Gilbert is a 84 y.o. (20-Oct-1928) male who presents with chief complaint of  Chief Complaint  Patient presents with  . GI Bleeding  .  History of Present Illness: I am asked to evaluate the patient by Dr. Celesta Aver.  Patient is an 84 year old gentleman who presented to [facility] earlier this morning with acute onset of GI bleed.  He estimates he has had 6 bloody bowel movements at home and since admission he has had another 8.  No syncope no documented hypotension he remains hemodynamically stable.  At the time of the consult he had not yet had a bleeding scan.  He is not on any anticoagulation at home however he does take 2 aspirin every day.  Current Facility-Administered Medications  Medication Dose Route Frequency Provider Last Rate Last Dose  . 0.9 %  sodium chloride infusion   Intravenous Continuous Vaughan Basta, MD 150 mL/hr at 12/05/17 2200    . 0.9 %  sodium chloride infusion   Intravenous Once Tukov, Magadalene S, NP      . docusate sodium (COLACE) capsule 100 mg  100 mg Oral BID PRN Vaughan Basta, MD      . fluticasone (FLONASE) 50 MCG/ACT nasal spray 1 spray  1 spray Each Nare Daily Vaughan Basta, MD      . iopamidol (ISOVUE-300) 61 % injection 30 mL  30 mL Oral Once Tukov, Magadalene S, NP      . loratadine (CLARITIN) tablet 10 mg  10 mg Oral Daily PRN Vaughan Basta, MD      . pantoprazole (PROTONIX) injection 40 mg  40 mg Intravenous Q12H Vaughan Basta, MD   40 mg at 12/05/17 2202  . polyethylene glycol-electrolytes (NuLYTELY/GoLYTELY) solution 4,000 mL  4,000 mL Oral Once Lucilla Lame, MD   Stopped at 12/05/17 2145  . sodium chloride 0.9 % bolus 500 mL  500 mL Intravenous Once PRN Vaughan Basta, MD      . tamsulosin (FLOMAX) capsule 0.4 mg  0.4 mg Oral Daily Vaughan Basta, MD   Stopped at 12/05/17 2146     Past Medical History:  Diagnosis Date  . Allergy   . Bradycardia   . Hypertension     Past Surgical History:  Procedure Laterality Date  . CATARACT EXTRACTION    . TONSILECTOMY, ADENOIDECTOMY, BILATERAL MYRINGOTOMY AND TUBES      Social History Social History   Tobacco Use  . Smoking status: Former Research scientist (life sciences)  . Smokeless tobacco: Never Used  Substance Use Topics  . Alcohol use: Yes  . Drug use: No    Family History Family History  Problem Relation Age of Onset  . Diabetes Father   . Diabetes Brother   . Bladder Cancer Neg Hx   . Kidney cancer Neg Hx   . Prostate cancer Neg Hx   No family history of bleeding/clotting disorders, porphyria or autoimmune disease   Allergies  Allergen Reactions  . Ace Inhibitors Cough  . Lovastatin     Other reaction(s): Muscle Pain  . Simvastatin     Other reaction(s): Muscle Pain     REVIEW OF SYSTEMS (Negative unless checked)  Constitutional: [] Weight loss  [] Fever  [] Chills Cardiac: [] Chest pain   [] Chest pressure   [] Palpitations   [] Shortness of breath when laying flat   [] Shortness of breath at rest   [] Shortness of breath with exertion. Vascular:  [] Pain in legs with walking   []   Pain in legs at rest   [] Pain in legs when laying flat   [] Claudication   [] Pain in feet when walking  [] Pain in feet at rest  [] Pain in feet when laying flat   [] History of DVT   [] Phlebitis   [] Swelling in legs   [] Varicose veins   [] Non-healing ulcers Pulmonary:   [] Uses home oxygen   [] Productive cough   [] Hemoptysis   [] Wheeze  [] COPD   [] Asthma Neurologic:  [] Dizziness  [] Blackouts   [] Seizures   [] History of stroke   [] History of TIA  [] Aphasia   [] Temporary blindness   [] Dysphagia   [] Weakness or numbness in arms   [] Weakness or numbness in legs Musculoskeletal:  [] Arthritis   [] Joint swelling   [] Joint pain   [] Low back pain Hematologic:  [] Easy bruising  [] Easy bleeding   [] Hypercoagulable state   [] Anemic  [] Hepatitis Gastrointestinal:   [x] Blood in stool   [] Vomiting blood  [] Gastroesophageal reflux/heartburn   [] Difficulty swallowing. Genitourinary:  [] Chronic kidney disease   [] Difficult urination  [] Frequent urination  [] Burning with urination   [] Blood in urine Skin:  [] Rashes   [] Ulcers   [] Wounds Psychological:  [] History of anxiety   []  History of major depression.  Physical Examination  Vitals:   12/05/17 2055 12/05/17 2100 12/05/17 2124 12/05/17 2200  BP:  137/82 (!) 145/78 (!) 155/86  Pulse: 63 68 65 77  Resp: 19 16 19 20   Temp: 98.4 F (36.9 C)     TempSrc:      SpO2:  96% 96% 99%  Weight:      Height:       Body mass index is 24.37 kg/m. Gen:  WD/WN, NAD Head: [place]/AT, No temporalis wasting. Prominent temp pulse not noted. Ear/Nose/Throat: Hearing grossly intact, nares w/o erythema or drainage, oropharynx w/o Erythema/Exudate Eyes: Sclera non-icteric, conjunctiva clear Neck: Trachea midline.  No JVD.  Pulmonary:  Good air movement, respirations not labored, equal bilaterally.  Cardiac: RRR, normal S1, S2. Vascular:  Vessel Right Left  Radial Palpable Palpable  Gastrointestinal: soft, non-tender/non-distended. No guarding/reflex.  Musculoskeletal: M/S 5/5 throughout.  Extremities without ischemic changes.  No deformity or atrophy. No edema. Neurologic: Sensation grossly intact in extremities.  Symmetrical.  Speech is fluent. Motor exam as listed above. Psychiatric: Judgment intact, Mood & affect appropriate for pt's clinical situation. Dermatologic: No rashes or ulcers noted.  No cellulitis or open wounds. Lymph : No Cervical, Axillary, or Inguinal lymphadenopathy.      CBC Lab Results  Component Value Date   WBC 6.8 12/05/2017   HGB 10.5 (L) 12/05/2017   HCT 36.8 (L) 12/05/2017   MCV 90.0 12/05/2017   PLT 177 12/05/2017    BMET    Component Value Date/Time   NA 136 12/05/2017 1003   K 4.4 12/05/2017 1003   CL 104 12/05/2017 1003   CO2 22 12/05/2017 1003   GLUCOSE 107 (H)  12/05/2017 1003   BUN 31 (H) 12/05/2017 1003   CREATININE 1.38 (H) 12/05/2017 1003   CALCIUM 8.7 (L) 12/05/2017 1003   GFRNONAA 44 (L) 12/05/2017 1003   GFRAA 51 (L) 12/05/2017 1003   Estimated Creatinine Clearance: 42.6 mL/min (A) (by C-G formula based on SCr of 1.38 mg/dL (H)).  COAG Lab Results  Component Value Date   INR 1.06 12/05/2017   INR 1.01 12/05/2017    Radiology Nm Gi Blood Loss  Result Date: 12/05/2017 CLINICAL DATA:  History of IBS. Bloody stools 8 x day. Hemoglobin drop since patient presents  to the ER this morning. Patient is receiving blood. Blood pressure dropped secondary to bleeding episode earlier today. EXAM: NUCLEAR MEDICINE GASTROINTESTINAL BLEEDING SCAN TECHNIQUE: Sequential abdominal images were obtained following intravenous administration of Tc-74m labeled red blood cells. RADIOPHARMACEUTICALS:  21.832 mCi Tc-42m pertechnetate in-vitro labeled red cells. COMPARISON:  Chest x-ray 05/18/2016 FINDINGS: Following administration of tagged red blood cells, there is appropriate blood pool activity. On the 2 hour images, there is abnormal activity within the left lower quadrant, projecting in the mid abdomen on the lateral view. Location of the activity is favored to be within the left lower quadrant small bowel loops. IMPRESSION: The study is positive. The most likely source of bleeding is within the left lower quadrant small bowel loops. These results were called by telephone at the time of interpretation on 12/05/2017 at 10:03 pm to [facility], who verbally acknowledged these results. Electronically Signed   By: Nolon Nations M.D.   On: 12/05/2017 22:11      Assessment/Plan 1.  GI bleed: The patient is now completed his tagged nuclear bleeding scan.  This is positive for small intestinal bleed.  Embolization is not indicated.  I have personally communicated with Dr. Sanda Klein.  Given his 2 aspirin every day I will order 2 units of platelets.  He has blood available  and is received 1 unit thus far.  His most recent hemoglobin was 10.  He is hemodynamically stable.  At the present time we will hydrate him and obtain a CT scan of the abdomen and pelvis with oral and IV contrast.  General surgery will follow. 2. Hypertension:   As mentioned ago hold losartan. 3. BPH   Continue tamsulosin.    Hortencia Pilar, MD  12/05/2017 10:58 PM    This note was created with Dragon medical transcription system.  Any error is purely unintentional

## 2017-12-05 NOTE — Progress Notes (Signed)
Patient stated he felt dizzy and fuzzy headed and heart rate down into 50's with BP 64/43.  Patient layed flat in bed and RN called Dr. Celesta Aver to the bedside and made her aware of recent bloody stool with clots and that blood was just started at 1755.  Patient denies shortness of breath and denies chest pain.  Dr. Celesta Aver at bedside and gave order to give half unit of blood at 999 ml/H and to give the other half at 120 cc/H and to recheck hgb after unit is finished and to keep 1 unit ready at all times. MD assessed patient.  MD stated to take patient to bleeding scan once his blood pressure is more stable.  Patient A&Ox4.

## 2017-12-05 NOTE — ED Triage Notes (Signed)
Pt reports dark red liquid when trying to have BM, pt has hx of IBS, states it started this morning.    Pt fumbling over when sxs started and c/o feeling lightheadedness. BP low in triage. Taken back to room 5 to see MD

## 2017-12-05 NOTE — Progress Notes (Signed)
Patient transferred to nuclear medicine with this RN.  Dr. Allen Norris called and returned page and RN made MD aware that patient has had 2 dark red stools with clots with the last stool being large and that patient was symptomatic with first stool dropping his blood pressure.  RN asked MD about protonix and octreotide drips and MD stated he didn't think they would be beneficial but did suggest vascular surgery consult.  RN called and spoke with Dr.Richards and made MD aware of all mentioned above and MD stated that she would review patient's chart and call vascular surgery consult.  RN remaining with patient in nuclear medicine for bleeding scan and monitoring patient closely.

## 2017-12-05 NOTE — Progress Notes (Signed)
Family Meeting Note  Advance Directive:yes  Today a meeting took place with the Patient, spouse and daughter.  The following clinical team members were present during this meeting:MD  The following were discussed:Patient's diagnosis: GI bleed, hypotension , Patient's progosis: Unable to determine and Goals for treatment: Full Code  Additional follow-up to be provided: GI consult.  Time spent during discussion:20 minutes  Vaughan Basta, MD

## 2017-12-05 NOTE — Progress Notes (Signed)
I was called in for rapid respones on pt. He had 2 more bloody Bowel movements. And His BP dropped.  I have seen the pt in room. Appears slightly anxious, but alert and oriented. BP low, HR stable. No abd pain. No Hypoxia.  Labs  Ordered stat Hb- 10.5 ( Drop noted since morning.)  12 lead EKG reviewed by me, no ST T changes, NSR, 1st degree AV block.  Assessment and plan  * Hypovolemic hypotension due to GI bleed   IV fluid bolus.   Check troponin.   He need blood transfusion. Did not receive since morning.  * GI bleed   Spoke to GI, Bleedign scan, Dr Allen Norris may do Colonoscopy tomorrow.    Due to critical condition , will move to [facility] unit now. Plan discussed with ICU nurse and intensivist.  Critical care time spent 30 min.

## 2017-12-05 NOTE — Progress Notes (Signed)
Notified by radiology that patient's bleeding scan is highly positive>LLQ small bowel loop. Case discussed with Dr Delana Meyer and Dr. Adonis Huguenin. Plan is to obtain a CT abdomen and pelvis with contrast, transfuse platelets and f/u H/H and transfuse if any significant drop.  Family updated at bedside. All questions answered   Magdalene S. [facility] ANP-BC Pulmonary and Critical Care Medicine [facility] Pager [phone] or [phone]  NB: This document was prepared using Dragon voice recognition software and may include unintentional dictation errors.

## 2017-12-05 NOTE — ED Provider Notes (Signed)
[facility] Emergency Department Provider Note  ____________________________________________   I have reviewed the triage vital signs and the nursing notes. Where available I have reviewed prior notes and, if possible and indicated, outside hospital notes.    HISTORY  Chief Complaint GI Bleeding    HPI Michael Gilbert is a 84 y.o. male who does not have any significant history of GI bleeding he states that he also denies being on blood thinners, he states he had 3 or 4 very large bloody bowel movements immediately prior to coming and felt lightheaded.  Blood pressure in triage was in the 60s and at that time he seemed mildly confused, triage nurse did accompany him back, he does not appear confused now.  He denies confusion.  He does not have any abdominal pain he denies hematemesis or vomiting.  He denies being on blood thinners, he states he does take aspirin  Symptoms started immediately prior to coming in nothing makes it better nothing makes it worse no antecedent symptoms, no pain no radiation of pain  Past Medical History:  Diagnosis Date  . Allergy   . Bradycardia   . Hypertension     Patient Active Problem List   Diagnosis Date Noted  . GI bleed 12/05/2017    Past Surgical History:  Procedure Laterality Date  . CATARACT EXTRACTION    . TONSILECTOMY, ADENOIDECTOMY, BILATERAL MYRINGOTOMY AND TUBES      Prior to Admission medications   Medication Sig Start Date End Date Taking? Authorizing Provider  aspirin EC 81 MG tablet Take 2 tablets by mouth once daily.   Yes [provider]  losartan (COZAAR) 25 MG tablet Take 25 mg by mouth daily.    Yes [provider]  Multiple Vitamin (MULTI-VITAMINS) TABS Take 1 tablet by mouth daily.    Yes [provider]  omeprazole (PRILOSEC) 40 MG capsule Take 40 mg by mouth daily.  08/02/17  Yes [provider]  tamsulosin (FLOMAX) 0.4 MG CAPS capsule Take 0.4 mg by mouth daily.   10/04/17  Yes [provider]  Acetaminophen 500 MG coapsule Take 5,002 mg by mouth every 6 (six) hours as needed.     [provider]  fluticasone (FLONASE) 50 MCG/ACT nasal spray Place 1 spray into both nostrils daily as needed.  07/03/17   [provider]  loratadine (CLARITIN) 10 MG tablet Take 10 mg by mouth daily as needed.  12/12/16   [provider]  mirabegron ER (MYRBETRIQ) 50 MG TB24 tablet Take 1 tablet (50 mg total) by mouth daily. Patient not taking: Reported on 11/17/2017 10/06/17   Bjorn Loser, MD  oxybutynin (DITROPAN-XL) 10 MG 24 hr tablet Take 1 tablet (10 mg total) by mouth daily. 11/17/17   Bjorn Loser, MD  tolterodine (DETROL LA) 4 MG 24 hr capsule Take 1 capsule (4 mg total) by mouth daily. 11/17/17   Bjorn Loser, MD    Allergies Ace inhibitors; Lovastatin; and Simvastatin  Family History  Problem Relation Age of Onset  . Diabetes Father   . Diabetes Brother   . Bladder Cancer Neg Hx   . Kidney cancer Neg Hx   . Prostate cancer Neg Hx     Social History Social History   Tobacco Use  . Smoking status: Former Research scientist (life sciences)  . Smokeless tobacco: Never Used  Substance Use Topics  . Alcohol use: Yes  . Drug use: No    Review of Systems Constitutional: No fever/chills Eyes: No visual changes.  ENT: No sore throat. No stiff neck no neck pain Cardiovascular: Denies chest pain. Respiratory: Denies shortness of breath. Gastrointestinal:   no vomiting.  Positive rectal bleeding no constipation. Genitourinary: Negative for dysuria. Musculoskeletal: Negative lower extremity swelling Skin: Negative for rash. Neurological: Negative for severe headaches, focal weakness or numbness.   ____________________________________________   PHYSICAL EXAM:  VITAL SIGNS: ED Triage Vitals [12/05/17 1009]  Enc Vitals Group     BP (!) 62/40     Pulse Rate (!) 55     Resp (!) 22     Temp 97.6 F (36.4 C)     Temp Source Oral      SpO2 95 %     Weight 180 lb (81.6 kg)     Height 6' (1.829 m)     Head Circumference      Peak Flow      Pain Score 0     Pain Loc      Pain Edu?      Excl. in [place]?     Constitutional: Alert and oriented. Well appearing and in no acute distress. Eyes: Conjunctivae are normal Head: Atraumatic HEENT: No congestion/rhinnorhea. Mucous membranes are moist.  Oropharynx non-erythematous Neck:   Nontender with no meningismus, no masses, no stridor Cardiovascular: Normal rate, regular rhythm. Grossly normal heart sounds.  Good peripheral circulation. Respiratory: Normal respiratory effort.  No retractions. Lungs CTAB. Abdominal: Soft and nontender. No distention. No guarding no rebound Back:  There is no focal tenderness or step off.  there is no midline tenderness there are no lesions noted. there is no CVA tenderness Exam: Bloody stool noted Musculoskeletal: No lower extremity tenderness, no upper extremity tenderness. No joint effusions, no DVT signs strong distal pulses no edema Neurologic:  Normal speech and language. No gross focal neurologic deficits are appreciated.  Skin:  Skin is warm, dry and intact. No rash noted. Psychiatric: Mood and affect are normal. Speech and behavior are normal.  ____________________________________________   LABS (all labs ordered are listed, but only abnormal results are displayed)  Labs Reviewed  COMPREHENSIVE METABOLIC PANEL - Abnormal; Notable for the following components:      Result Value   Glucose, Bld 107 (*)    BUN 31 (*)    Creatinine, Ser 1.38 (*)    Calcium 8.7 (*)    AST 69 (*)    GFR calc non Af Amer 44 (*)    GFR calc Af Amer 51 (*)    All other components within normal limits  CBC - Abnormal; Notable for the following components:   RBC 4.09 (*)    Hemoglobin 12.6 (*)    HCT 36.8 (*)    RDW 14.7 (*)    All other components within normal limits  HEMOGLOBIN - Abnormal; Notable for the following components:   Hemoglobin 11.9 (*)     All other components within normal limits  PROTIME-INR  PROTIME-INR  HEMOGLOBIN  HEMOGLOBIN  HEMOGLOBIN  TYPE AND SCREEN  PREPARE RBC (CROSSMATCH)  ABO/RH    Pertinent labs  results that were available during my care of the patient were reviewed by me and considered in my medical decision making (see chart for details). ____________________________________________  EKG  I personally interpreted any EKGs ordered by me or triage  ____________________________________________  RADIOLOGY  Pertinent labs & imaging results that were available during my care of the patient were reviewed by me and considered in my medical decision making (see chart for details). If possible, patient and/or  family made aware of any abnormal findings.  No results found. ____________________________________________    PROCEDURES  Procedure(s) performed: None  Procedures  Critical Care performed: CRITICAL CARE Performed by: Schuyler Amor   Total critical care time: 45 minutes  Critical care time was exclusive of separately billable procedures and treating other patients.  Critical care was necessary to treat or prevent imminent or life-threatening deterioration.  Critical care was time spent personally by me on the following activities: development of treatment plan with patient and/or surrogate as well as nursing, discussions with consultants, evaluation of patient's response to treatment, examination of patient, obtaining history from patient or surrogate, ordering and performing treatments and interventions, ordering and review of laboratory studies, ordering and review of radiographic studies, pulse oximetry and re-evaluation of patient's condition.   ____________________________________________   INITIAL IMPRESSION / ASSESSMENT AND PLAN / ED COURSE  Pertinent labs & imaging results that were available during my care of the patient were reviewed by me and considered in my medical  decision making (see chart for details).  Patient here with bloody diarrhea, initial blood pressure was quite concerning however repeats when we got him back to the room and he was lying down appeared better.  I did order blood transfusion to start.  Patient admitted to the hospitalist service.  Initial troponin reassuring.    ____________________________________________   FINAL CLINICAL IMPRESSION(S) / ED DIAGNOSES  Final diagnoses:  Gastrointestinal hemorrhage, unspecified gastrointestinal hemorrhage type      This chart was dictated using voice recognition software.  Despite best efforts to proofread,  errors can occur which can change meaning.      Schuyler Amor, MD 12/05/17 [phone]

## 2017-12-05 NOTE — Significant Event (Signed)
Rapid Response Event Note  Overview: Time Called: [place] Time: 8546 Event Type: Hypotension  Initial Focused Assessment: Rapid response RN arrived in room and patient pale and in bed, with patient's RN Tiffany and other [place] RNs Barnabas Lister and Cecil) at bedside. Patient admitted today for GI bleeding, had had 4 bright red stools at home and 2 more since arrived on 2C. Patient had not received any blood products yet as hemoglobin was 11.9 at 11:24. Staff was present with patient for 2nd bloody bowel movement, after which patient became pale and dizzy. Patient was helped back to bed and blood pressure was checked and was 66/34. They rechecked patient's BP manually and it was 58/36. Rapid response was then called. Prior to this RN's arrival a stat hemoglobin ordered by Dr. Anselm Jungling had been obtained by phlebotomist. CBG being obtained as this RN arrived and was 110. Initial vital signs at 15:47: BP 75/51, MAP 60, HR 42 SB (patient had been in 60's with previous vital signs), RR 18, O2 sats 100% on room air.   Interventions: 500 mL bolus of normal saline given per rapid response standing orders for hypotension and GI bleed. Patient already had type and screen done with blood band on his left arm. 12 lead EKG obtained and showed sinus bradycardia with 1st degree heart block. Wife and patient reported that patient does see [name] outpatient for a slow, irregular heart rate. Stat hemoglobin level resulted just as 12 lead EKG started and was 10.5. Dr. Anselm Jungling arrived at bedside just as EKG finished. Dr. Anselm Jungling reviewed this EKG as part of his assessment of patient at bedside. During 500 mL bolus at 15:56 BP 112/64, MAP 79, HR 57 SB, RR 12, 98% on room air, patient reported feeling better, less dizzy. Post 500 mL bolus (which finished just prior to departure from [place], patient's color more pink, patient reported feeling "good" with no dizziness, and BP 129/69, MAP 88, HR 59 SB, RR 14, O2 sats 99% on room air.    Plan of Care (if not transferred): Patient to transfer to ICU and to receive bleeding scan when blood pressure stable per MD. One more bolus (1L NS) to be given to patient. This RN stayed with Tiffany RN through transfer of patient to ICU room 1.   Event Summary: Name of Physician Notified: Dr. Anselm Jungling at 1550(had been paged&responded prior to rapid as well)    at    Outcome: Transferred (Comment)  Event End Time: [place], [place]

## 2017-12-05 NOTE — Progress Notes (Signed)
Chaplain responded to a Rapid Response. Pt was alert as team accessed vitals. Wife was at the restroom., Ch practice presence as Pt was able to communicate with team. Pt was moved to ICU . Wife was escorted over.   12/05/17 1600  Clinical Encounter Type  Visited With Patient and family together  Visit Type Spiritual support  Referral From Care management  Spiritual Encounters  Spiritual Needs Emotional

## 2017-12-05 NOTE — ED Notes (Signed)
Called report to Dalbert Mayotte RN

## 2017-12-05 NOTE — Progress Notes (Signed)
Pt tolerated red tag bleeding scan well, VSS, blood completed, no BM during procedure, but on return to ICU had a moderate dark  Liquid stool, approx 400 mls. Report to [employer]

## 2017-12-06 ENCOUNTER — Encounter: Payer: Self-pay | Admitting: Radiology

## 2017-12-06 ENCOUNTER — Inpatient Hospital Stay: Payer: Medicare Other | Admitting: Anesthesiology

## 2017-12-06 ENCOUNTER — Encounter
Admission: EM | Disposition: A | Discharge status: Home / Self Care | Payer: Self-pay | Source: Home / Self Care | Attending: Internal Medicine

## 2017-12-06 ENCOUNTER — Inpatient Hospital Stay: Payer: Medicare Other

## 2017-12-06 DIAGNOSIS — D62 Acute posthemorrhagic anemia: Secondary | ICD-10-CM

## 2017-12-06 DIAGNOSIS — K625 Hemorrhage of anus and rectum: Secondary | ICD-10-CM

## 2017-12-06 DIAGNOSIS — K922 Gastrointestinal hemorrhage, unspecified: Secondary | ICD-10-CM

## 2017-12-06 DIAGNOSIS — D123 Benign neoplasm of transverse colon: Secondary | ICD-10-CM

## 2017-12-06 DIAGNOSIS — I10 Essential (primary) hypertension: Secondary | ICD-10-CM

## 2017-12-06 DIAGNOSIS — K5730 Diverticulosis of large intestine without perforation or abscess without bleeding: Secondary | ICD-10-CM

## 2017-12-06 DIAGNOSIS — K648 Other hemorrhoids: Secondary | ICD-10-CM

## 2017-12-06 HISTORY — PX: COLONOSCOPY WITH PROPOFOL: SHX5780

## 2017-12-06 LAB — MAGNESIUM: Magnesium: 1.8 mg/dL (ref 1.7–2.4)

## 2017-12-06 LAB — BASIC METABOLIC PANEL
ANION GAP: 9 (ref 5–15)
BUN: 28 mg/dL — ABNORMAL HIGH (ref 6–20)
CO2: 18 mmol/L — ABNORMAL LOW (ref 22–32)
Calcium: 7.5 mg/dL — ABNORMAL LOW (ref 8.9–10.3)
Chloride: 107 mmol/L (ref 101–111)
Creatinine, Ser: 1.15 mg/dL (ref 0.61–1.24)
GFR, EST NON AFRICAN AMERICAN: 55 mL/min — AB (ref >60)
Glucose, Bld: 114 mg/dL — ABNORMAL HIGH (ref 65–99)
POTASSIUM: 4.1 mmol/L (ref 3.5–5.1)
SODIUM: 134 mmol/L — AB (ref 135–145)

## 2017-12-06 LAB — CBC
HEMATOCRIT: 31.8 % — AB (ref 40.0–52.0)
HEMATOCRIT: 26.7 % — AB (ref 40.0–52.0)
HEMOGLOBIN: 10.7 g/dL — AB (ref 13.0–18.0)
HEMOGLOBIN: 9 g/dL — AB (ref 13.0–18.0)
MCH: 29.8 pg (ref 26.0–34.0)
MCH: 29.6 pg (ref 26.0–34.0)
MCHC: 33.8 g/dL (ref 32.0–36.0)
MCHC: 33.7 g/dL (ref 32.0–36.0)
MCV: 88.1 fL (ref 80.0–100.0)
MCV: 87.9 fL (ref 80.0–100.0)
Platelets: 143 10*3/uL — ABNORMAL LOW (ref 150–440)
Platelets: 137 10*3/uL — ABNORMAL LOW (ref 150–440)
RBC: 3.61 MIL/uL — ABNORMAL LOW (ref 4.40–5.90)
RBC: 3.04 MIL/uL — AB (ref 4.40–5.90)
RDW: 15.8 % — ABNORMAL HIGH (ref 11.5–14.5)
RDW: 16 % — ABNORMAL HIGH (ref 11.5–14.5)
WBC: 7.4 10*3/uL (ref 3.8–10.6)
WBC: 7.6 10*3/uL (ref 3.8–10.6)

## 2017-12-06 LAB — FOLATE: Folate: 46 ng/mL (ref >5.9)

## 2017-12-06 LAB — IRON AND TIBC
IRON: 44 ug/dL — AB (ref 45–182)
SATURATION RATIOS: 16 % — AB (ref 17.9–39.5)
TIBC: 283 ug/dL (ref 250–450)
UIBC: 239 ug/dL

## 2017-12-06 LAB — HEMOGLOBIN
HEMOGLOBIN: 7.5 g/dL — AB (ref 13.0–18.0)
Hemoglobin: 7.8 g/dL — ABNORMAL LOW (ref 13.0–18.0)
Hemoglobin: 8.1 g/dL — ABNORMAL LOW (ref 13.0–18.0)

## 2017-12-06 LAB — FERRITIN: FERRITIN: 75 ng/mL (ref 24–336)

## 2017-12-06 LAB — PHOSPHORUS: Phosphorus: 2.9 mg/dL (ref 2.5–4.6)

## 2017-12-06 SURGERY — COLONOSCOPY WITH PROPOFOL
Anesthesia: General

## 2017-12-06 MED ORDER — EPHEDRINE SULFATE 50 MG/ML IJ SOLN
INTRAMUSCULAR | Status: DC | PRN
  Administered 2017-12-06 (×3): 10 mg via INTRAVENOUS

## 2017-12-06 MED ORDER — PROPOFOL 10 MG/ML IV BOLUS
INTRAVENOUS | Status: DC | PRN
  Administered 2017-12-06: 100 mg via INTRAVENOUS

## 2017-12-06 MED ORDER — MAGNESIUM SULFATE 2 GM/50ML IV SOLN
2.0000 g | Freq: Once | INTRAVENOUS | Status: AC
  Administered 2017-12-06: 2 g via INTRAVENOUS

## 2017-12-06 MED ORDER — IOPAMIDOL (ISOVUE-300) INJECTION 61%
75.0000 mL | Freq: Once | INTRAVENOUS | Status: AC | PRN
  Administered 2017-12-06: 75 mL via INTRAVENOUS

## 2017-12-06 MED ORDER — PROPOFOL 500 MG/50ML IV EMUL
INTRAVENOUS | Status: AC
  Filled 2017-12-06: qty 50

## 2017-12-06 MED ORDER — PROPOFOL 500 MG/50ML IV EMUL
INTRAVENOUS | Status: DC | PRN
  Administered 2017-12-06: 140 ug/kg/min via INTRAVENOUS

## 2017-12-06 MED ORDER — LIDOCAINE 2% (20 MG/ML) 5 ML SYRINGE
INTRAMUSCULAR | Status: DC | PRN
  Administered 2017-12-06: 100 mg via INTRAVENOUS

## 2017-12-06 MED ORDER — MAGNESIUM SULFATE 2 GM/50ML IV SOLN
INTRAVENOUS | Status: AC
  Administered 2017-12-06: 2 g via INTRAVENOUS
  Filled 2017-12-06: qty 50

## 2017-12-06 MED ORDER — LACTATED RINGERS IV SOLN
INTRAVENOUS | Status: DC | PRN
  Administered 2017-12-06: 15:00:00 via INTRAVENOUS

## 2017-12-06 MED ORDER — GLYCOPYRROLATE 0.2 MG/ML IJ SOLN
INTRAMUSCULAR | Status: DC | PRN
  Administered 2017-12-06: 0.2 mg via INTRAVENOUS

## 2017-12-06 MED ORDER — STERILE WATER FOR INJECTION IJ SOLN
INTRAMUSCULAR | Status: AC
  Administered 2017-12-06: 10 mL
  Filled 2017-12-06: qty 10

## 2017-12-06 MED ORDER — PHENYLEPHRINE HCL 10 MG/ML IJ SOLN
INTRAMUSCULAR | Status: DC | PRN
  Administered 2017-12-06: 100 ug via INTRAVENOUS

## 2017-12-06 NOTE — Progress Notes (Signed)
PULMONARY / CRITICAL CARE MEDICINE   Name: Michael Gilbert MRN: [id number] DOB: Aug 27, 1928    ADMISSION DATE:  12/05/2017   CONSULTATION DATE: Dr. Izora Gala MD: Dr. Anselm Jungling   Reason: Acute GI bleed  HISTORY OF PRESENT ILLNESS:   This is a pleasant 84 year old male with a past medical history of diverticulosis, and hypertension who presented to the ED with acute onset rectal bleeding.  He reports having multiple stools prior to admission.  He continues to have bloody stools however his hemoglobin has been stable with just mild decrease.  He received 1 unit of packed red blood cells and was taken down for a bleeding scan with that was positive.  He denies any symptoms.  He had an episode of dizziness with the last large bloody bowel movement and a slight drop in his blood pressure but his blood pressure and symptoms have  resolved.  He has been seen by gastroenterology, vascular surgery and general surgery.  The plan tonight is to transfuse him with platelets, monitor his hemoglobin and give him more packed red blood cells if needed and general surgery will reevaluate him in the morning.   SUBJECTIVE:  Pleasant, awake and in no distress. Seen by general surgery this morning(Dr. Adonis Huguenin).  Patient is currently drinking GoLYTELY for his colonoscopy prep.  Plan is for GI to do a colonoscopy this morning.  Per the general surgery, the likely culprit for his bleeding is diverticulosis.  Plan is to attempt to control bleeding endoscopically or endovascularly and if unable then surgery may be indicated.   Once patient started drinking the GoLYTELY, he had an extra extra large bloody bowel movement.  His vital signs remained stable.    VITAL SIGNS: BP 120/74   Pulse 71   Temp 97.8 F (36.6 C) (Oral)   Resp 18   Ht 6\' 1"  (1.854 m)   Wt 83.8 kg (184 lb 11.9 oz)   SpO2 96%   BMI 24.37 kg/m   HEMODYNAMICS:    VENTILATOR SETTINGS:    INTAKE / OUTPUT: I/O last 3 completed  shifts: In: 9509 [I.V.:1740; Blood:720; IV Piggyback:1000] Out: 1370 [Urine:1170; Stool:200]  PHYSICAL EXAMINATION:  General:  NAD, appears younger for age Neuro: Alert and oriented x3 no focal deficits HEENT: PERRLA, no JVD, trachea midline Cardiovascular: Apical pulse regular, S1-S2, no murmur regurg or gallop, no edema, +2 pulses bilaterally Lungs: Clear to auscultation bilaterally Abdomen: Nondistended, normal bowel sounds in all 4 quadrants; large amounts of bloody stools; no clots Musculoskeletal: No joint deformities, positive range of motion in upper and lower extremities Skin: warm and dry  LABS:  BMET Recent Labs  Lab 12/05/17 1003 12/06/17 0440  NA 136 134*  K 4.4 4.1  CL 104 107  CO2 22 18*  BUN 31* 28*  CREATININE 1.38* 1.15  GLUCOSE 107* 114*    Electrolytes Recent Labs  Lab 12/05/17 1003 12/06/17 0440  CALCIUM 8.7* 7.5*  MG  --  1.8  PHOS  --  2.9    CBC Recent Labs  Lab 12/05/17 1003  12/05/17 1528 12/05/17 2242 12/06/17 0440  WBC 6.8  --   --  7.4 7.6  HGB 12.6*   < > 10.5* 10.7*  10.5* 9.0*  HCT 36.8*  --   --  31.8* 26.7*  PLT 177  --   --  143* 137*   < > = values in this interval not displayed.    Coag's Recent Labs  Lab 12/05/17 1004 12/05/17  1124  INR 1.01 1.06    Sepsis Markers No results for input(s): LATICACIDVEN, PROCALCITON, O2SATVEN in the last 168 hours.  ABG No results for input(s): PHART, PCO2ART, PO2ART in the last 168 hours.  Liver Enzymes Recent Labs  Lab 12/05/17 1003  AST 69*  ALT 24  ALKPHOS 64  BILITOT 0.7  ALBUMIN 3.5    Cardiac Enzymes Recent Labs  Lab 12/05/17 1558  TROPONINI <0.03    Glucose Recent Labs  Lab 12/05/17 1539  GLUCAP 110*    Imaging Nm Gi Blood Loss  Result Date: 12/05/2017 CLINICAL DATA:  History of IBS. Bloody stools 8 x day. Hemoglobin drop since patient presents to the ER this morning. Patient is receiving blood. Blood pressure dropped secondary to bleeding  episode earlier today. EXAM: NUCLEAR MEDICINE GASTROINTESTINAL BLEEDING SCAN TECHNIQUE: Sequential abdominal images were obtained following intravenous administration of Tc-62m labeled red blood cells. RADIOPHARMACEUTICALS:  21.832 mCi Tc-26m pertechnetate in-vitro labeled red cells. COMPARISON:  Chest x-ray 05/18/2016 FINDINGS: Following administration of tagged red blood cells, there is appropriate blood pool activity. On the 2 hour images, there is abnormal activity within the left lower quadrant, projecting in the mid abdomen on the lateral view. Location of the activity is favored to be within the left lower quadrant small bowel loops. IMPRESSION: The study is positive. The most likely source of bleeding is within the left lower quadrant small bowel loops. These results were called by telephone at the time of interpretation on 12/05/2017 at 10:03 pm to [facility], who verbally acknowledged these results. Electronically Signed   By: Nolon Nations M.D.   On: 12/05/2017 22:11   Ct Abdomen Pelvis W Contrast  Result Date: 12/06/2017 CLINICAL DATA:  Bright red blood in stool. Decreased blood pressure. Radionuclide bleeding study yesterday was positive with sore suggested in left lower quadrant small bowel. EXAM: CT ABDOMEN AND PELVIS WITH CONTRAST TECHNIQUE: Multidetector CT imaging of the abdomen and pelvis was performed using the standard protocol following bolus administration of intravenous contrast. CONTRAST:  37mL ISOVUE-300 IOPAMIDOL (ISOVUE-300) INJECTION 61% COMPARISON:  Radionuclide bleeding scan 12/05/2017 FINDINGS: Lower chest: Atelectasis and fibrosis in the lung bases. Hepatobiliary: Multiple subcentimeter low-attenuation lesions throughout the liver are too small to characterize but likely represent cysts or hemangiomas. Gallbladder and bile ducts are unremarkable. Pancreas: Unremarkable. No pancreatic ductal dilatation or surrounding inflammatory changes. Spleen: Normal in size without focal  abnormality. Adrenals/Urinary Tract: Adrenal glands are unremarkable. Kidneys are normal, without renal calculi, focal lesion, or hydronephrosis. Bladder is unremarkable. Stomach/Bowel: Stomach, small bowel, and colon are not abnormally distended. Scattered stool throughout the colon. Scattered colonic diverticula. No evidence of diverticulitis. No focal bowel wall thickening or inflammatory infiltration. Appendix is normal. Small bilateral inguinal hernias containing small bowel but without proximal obstruction. Vascular/Lymphatic: Aortic atherosclerosis. No enlarged abdominal or pelvic lymph nodes. Reproductive: Prostate gland is enlarged, measuring 6.2 cm diameter. Other: No free air or free fluid in the abdomen. Abdominal wall musculature appears intact. Musculoskeletal: Degenerative changes in the spine. No destructive bone lesions. IMPRESSION: 1. No evidence of bowel obstruction or inflammation. Diverticulosis of the colon without evidence of diverticulitis. 2. Small bilateral inguinal hernias containing bowel but without proximal obstruction. 3. Subcentimeter low-attenuation lesions in the liver are too small to characterize but likely represent cysts or hemangiomas. 4. Prostate gland is enlarged. 5. Aortic atherosclerosis. Electronically Signed   By: Lucienne Capers M.D.   On: 12/06/2017 01:40    STUDIES:  Colonoscopy pending  CULTURES: None  ANTIBIOTICS:  None  SIGNIFICANT EVENTS: 12/05/2017: Admitted with acute GI bleed  LINES/TUBES: Peripheral IVs  DISCUSSION: 84 year old Caucasian male presenting with acute lower GI bleed.  Bleeding scan positive, CT abdomen shows diverticulosis without diverticulitis; likely etiology is a diverticular bleed.  His hemoglobin continues to trend down but he remains hemodynamically stable  ASSESSMENT / PLAN:  GASTROINTESTINAL A:   Acute lower GI bleed-bleeding scan positive Reticulosis without acute diverticulitis P:   General surgery, vascular  surgery and gastroenterology following Colonoscopy planned for tomorrow Keep n.p.o. Start CT abdomen and pelvis with oral contrast-reviewed; possible culprit is diverticular bleed   CARDIOVASCULAR A:  History of hypertension History of bradycardia High risk for volume depletion secondary to active GI bleed P:  Hemodynamic monitoring per ICU protocol Volume replacement Hold antihypertensives  RENAL A:   No acute issues P:   Monitor and replace electrolytes  HEMATOLOGIC A:   Acute blood loss anemia-hemoglobin trending down Platelet dysfunction secondary to aspirin use P:  Trend hemoglobin every 6 hours and transfuse as needed Transfuse platelets per vascular surgery/gen surgery recommendations  INFECTIOUS A:   No acute issues P:   Monitor fever curve  GU A:   History of BPH- enlarged prostate on CT P:   Monitor for urinary retention Foley catheter if needed  FAMILY  - Updates: Patient'S Gayla Doss updated by phone - Inter-disciplinary family meet or Palliative Care meeting due by:  day 7   Jamaiyah Pyle S. [facility] ANP-BC Pulmonary and Critical Care [facility] Pager [phone] or [phone]  NB: This document was prepared using Dragon voice recognition software and may include unintentional dictation errors.    12/06/2017, 8:26 AM

## 2017-12-06 NOTE — Op Note (Signed)
[facility] Patient Name: Michael Gilbert Procedure Date: 12/06/2017 3:04 PM MRN: [id number] Account #: [id number] Date of Birth: 1928/02/20 Admit Type: Inpatient Age: 84 Room: [facility] ENDO ROOM 4 Gender: Male Note Status: Finalized Procedure:            Colonoscopy Indications:          Rectal bleeding Providers:            Lin Landsman MD, MD Referring MD:         Caprice Renshaw MD (Referring MD) Medicines:            Monitored Anesthesia Care Complications:        No immediate complications. Estimated blood loss: None. Procedure:            Pre-Anesthesia Assessment:                       - Prior to the procedure, a History and Physical was                        performed, and patient medications and allergies were                        reviewed. The patient is competent. The risks and                        benefits of the procedure and the sedation options and                        risks were discussed with the patient. All questions                        were answered and informed consent was obtained.                        Patient identification and proposed procedure were                        verified by the physician, the nurse, the                        anesthesiologist, the anesthetist and the technician in                        the pre-procedure area in the procedure room. Mental                        Status Examination: alert and oriented. Airway                        Examination: normal oropharyngeal airway and neck                        mobility. Respiratory Examination: clear to                        auscultation. CV Examination: normal. Prophylactic                        Antibiotics: The patient does not require prophylactic  antibiotics. Prior Anticoagulants: The patient has                        taken aspirin 70m 2 pills daily, last dose was 2 days                        prior to procedure.  ASA Grade Assessment: II - A                        patient with mild systemic disease. After reviewing the                        risks and benefits, the patient was deemed in                        satisfactory condition to undergo the procedure. The                        anesthesia plan was to use monitored anesthesia care                        (MAC). Immediately prior to administration of                        medications, the patient was re-assessed for adequacy                        to receive sedatives. The heart rate, respiratory rate,                        oxygen saturations, blood pressure, adequacy of                        pulmonary ventilation, and response to care were                        monitored throughout the procedure. The physical status                        of the patient was re-assessed after the procedure.                       After obtaining informed consent, the colonoscope was                        passed under direct vision. Throughout the procedure,                        the patient's blood pressure, pulse, and oxygen                        saturations were monitored continuously. The                        Colonoscope was introduced through the anus and                        advanced to the 6 cm into the ileum. The colonoscopy  was somewhat difficult due to significant looping.                        Successful completion of the procedure was aided by                        applying abdominal pressure. The patient tolerated the                        procedure well. The quality of the bowel preparation                        was evaluated using the BBPS [facility] Bowel Preparation                        Scale) with scores of: Right Colon = 3, Transverse                        Colon = 3 and Left Colon = 3 (entire mucosa seen well                        with no residual staining, small fragments of stool or                         opaque liquid). The total BBPS score equals 9. Findings:      The perianal and digital rectal examinations were normal. Pertinent       negatives include normal sphincter tone and no palpable rectal lesions.      No fresh or old blood in entire colon and terminal ileum      Two sessile polyps were found in the transverse colon. The polyps were 4       to 5 mm in size. These polyps were removed with a cold snare. Resection       and retrieval were complete.      Multiple small and large-mouthed diverticula were found in the sigmoid       colon.      The terminal ileum appeared normal.      Non-bleeding internal hemorrhoids were found during retroflexion. The       hemorrhoids were large.      The exam was otherwise without abnormality. Impression:           - Two 4 to 5 mm polyps in the transverse colon, removed                        with a cold snare. Resected and retrieved.                       - Diverticulosis in the sigmoid colon.                       - No evidence of active bleeding                       - He probably had divertucular bleed in sigmoid colon                        based on bleeding scan secondary to chronic use of high  dose aspirin                       - The examined portion of the ileum was normal.                       - Non-bleeding internal hemorrhoids.                       - The examination was otherwise normal. Recommendation:       - Return patient to hospital ward for ongoing care.                       - Resume regular diet today.                       - Continue present medications.                       - Recommend to take only asa81 daily instead of BID                       - Ok to discharge home from GI stand point and f/u in                        GI clinic in 1-2weeks after discharge Procedure Code(s):    --- Professional ---                       [id number], Colonoscopy, flexible; with removal of tumor(s),                         polyp(s), or other lesion(s) by snare technique Diagnosis Code(s):    --- Professional ---                       D12.3, Benign neoplasm of transverse colon (hepatic                        flexure or splenic flexure)                       K64.8, Other hemorrhoids                       K57.30, Diverticulosis of large intestine without                        perforation or abscess without bleeding                       K62.5, Hemorrhage of anus and rectum CPT copyright 2016 [employer]. All rights reserved. The codes documented in this report are preliminary and upon coder review may  be revised to meet current compliance requirements. Dr. Ulyess Mort Lin Landsman MD, MD 12/06/2017 3:58:32 PM This report has been signed electronically. Number of Addenda: 0 Note Initiated On: 12/06/2017 3:04 PM Scope Withdrawal Time: 0 hours 15 minutes 48 seconds  Total Procedure Duration: 0 hours 24 minutes 49 seconds       [facility]

## 2017-12-06 NOTE — Anesthesia Preprocedure Evaluation (Signed)
Anesthesia Evaluation  Patient identified by MRN, date of birth, ID band Patient awake    Reviewed: Allergy & Precautions, H&P , NPO status , Patient's Chart, lab work & pertinent test results, reviewed documented beta blocker date and time   History of Anesthesia Complications Negative for: history of anesthetic complications  Airway Mallampati: III  TM Distance: >3 FB Neck ROM: full    Dental  (+) Dental Advidsory Given, Upper Dentures, Lower Dentures   Pulmonary neg shortness of breath, sleep apnea and Continuous Positive Airway Pressure Ventilation , neg COPD, Recent URI , Resolved, former smoker,           Cardiovascular Exercise Tolerance: Good hypertension, (-) angina(-) CAD, (-) Past MI, (-) Cardiac Stents and (-) CABG + dysrhythmias (-) Valvular Problems/Murmurs     Neuro/Psych negative neurological ROS  negative psych ROS   GI/Hepatic Neg liver ROS, GERD  ,  Endo/Other  negative endocrine ROS  Renal/GU negative Renal ROS  negative genitourinary   Musculoskeletal   Abdominal   Peds  Hematology negative hematology ROS (+)   Anesthesia Other Findings Past Medical History: No date: Allergy No date: Bradycardia No date: Colorectal polyps No date: Diverticulosis No date: Hypertension   Reproductive/Obstetrics negative OB ROS                             Anesthesia Physical Anesthesia Plan  ASA: II  Anesthesia Plan: General   Post-op Pain Management:    Induction: Intravenous  PONV Risk Score and Plan: 2 and Propofol infusion  Airway Management Planned: Nasal Cannula  Additional Equipment:   Intra-op Plan:   Post-operative Plan:   Informed Consent: I have reviewed the patients History and Physical, chart, labs and discussed the procedure including the risks, benefits and alternatives for the proposed anesthesia with the patient or authorized representative who has  indicated his/her understanding and acceptance.   Dental Advisory Given  Plan Discussed with: Anesthesiologist, CRNA and Surgeon  Anesthesia Plan Comments:         Anesthesia Quick Evaluation

## 2017-12-06 NOTE — Progress Notes (Signed)
[facility]  Daily Progress Note   Subjective  -   Patient denies any bloody bowel movements for the past 8 hours.  He is currently undergoing his prep for colonoscopy today.  Objective Vitals:   12/06/17 0730 12/06/17 0755 12/06/17 0945 12/06/17 1000  BP: 120/74  135/72 115/68  Pulse: 71  81 68  Resp: 18  14 17   Temp: 98 F (36.7 C) 97.8 F (36.6 C) (!) 96.9 F (36.1 C)   TempSrc: Oral Oral Axillary   SpO2: 96%  100% 94%  Weight:      Height:        Intake/Output Summary (Last 24 hours) at 12/06/2017 1548 Last data filed at 12/06/2017 1122 Gross per 24 hour  Intake 4040 ml  Output 3495 ml  Net 545 ml    PULM  CTAB CV  RRR VASC  abdomen soft nondistended  Laboratory CBC    Component Value Date/Time   WBC 7.6 12/06/2017 0440   HGB 7.8 (L) 12/06/2017 1005   HCT 26.7 (L) 12/06/2017 0440   PLT 137 (L) 12/06/2017 0440    BMET    Component Value Date/Time   NA 134 (L) 12/06/2017 0440   K 4.1 12/06/2017 0440   CL 107 12/06/2017 0440   CO2 18 (L) 12/06/2017 0440   GLUCOSE 114 (H) 12/06/2017 0440   BUN 28 (H) 12/06/2017 0440   CREATININE 1.15 12/06/2017 0440   CALCIUM 7.5 (L) 12/06/2017 0440   GFRNONAA 55 (L) 12/06/2017 0440   GFRAA >60 12/06/2017 0440    Assessment/Planning:   Acute GI bleed: CT scan suggests patient has a very redundant sigmoid and that the bleeding is actually from the sigmoid colon.  We will continue to follow he is undergoing colonoscopy at this time but appears to have stopped bleeding.  Embolization may be of benefit if he rebleeds given the CT findings localizing the sigmoid colon and not the small intestine.  Hopefully colonoscopy will correlate and if he does indeed rebleed embolization could be helpful.    Michael Gilbert  12/06/2017, 3:48 PM

## 2017-12-06 NOTE — Anesthesia Postprocedure Evaluation (Signed)
Anesthesia Post Note  Patient: Michael Gilbert  Procedure(s) Performed: COLONOSCOPY WITH PROPOFOL (N/A )  Patient location during evaluation: Endoscopy Anesthesia Type: General Level of consciousness: awake and alert Pain management: pain level controlled Vital Signs Assessment: post-procedure vital signs reviewed and stable Respiratory status: spontaneous breathing, nonlabored ventilation, respiratory function stable and patient connected to nasal cannula oxygen Cardiovascular status: blood pressure returned to baseline and stable Postop Assessment: no apparent nausea or vomiting Anesthetic complications: no     Last Vitals:  Vitals:   12/06/17 1900 12/06/17 1930  BP: 122/74 (!) 154/62  Pulse: 60 64  Resp: 18 (!) 21  Temp:  36.6 C  SpO2: 99% 95%    Last Pain:  Vitals:   12/06/17 1930  TempSrc: Axillary  PainSc:                  [name]

## 2017-12-06 NOTE — Consult Note (Signed)
Patient ID: Michael Gilbert, male   DOB: 06-Jun-1928, 84 y.o.   MRN: [id number]  CC: GI Bleed  HPI KATE SWEETMAN is a 84 y.o. male who is currently admitted to the ICU secondary to a GI bleed.  General surgery consult was requested by Dr. Celesta Aver of the ICU for ongoing GI bleed.  At the time of my consultation patient had received 1 unit of packed red blood cells.  He had a bleeding scan which showed active bleeding in the left lower quadrant.  Had a CT scan which shows stool within the right hemicolon and fluid within the left hemicolon and evidence of diverticulosis.  Patient reports he has no abdominal pain.  He is currently taking a prep for a colonoscopy later today and continues to have bloody bowel movements.  He denies any rapid heart rate, shortness of breath, dizziness.  He also denies any fevers, chills, nausea, vomiting, chest pain, shortness of breath.  He is having loose bloody bowel movements but is currently taking a bowel prep.  He is never had anything like this before.  HPI  Past Medical History:  Diagnosis Date  . Allergy   . Bradycardia   . Colorectal polyps   . Diverticulosis   . Hypertension     Past Surgical History:  Procedure Laterality Date  . CATARACT EXTRACTION    . TONSILECTOMY, ADENOIDECTOMY, BILATERAL MYRINGOTOMY AND TUBES      Family History  Problem Relation Age of Onset  . Diabetes Father   . Diabetes Brother   . Bladder Cancer Neg Hx   . Kidney cancer Neg Hx   . Prostate cancer Neg Hx     Social History Social History   Tobacco Use  . Smoking status: Former Research scientist (life sciences)  . Smokeless tobacco: Never Used  Substance Use Topics  . Alcohol use: Yes  . Drug use: No    Allergies  Allergen Reactions  . Ace Inhibitors Cough  . Lovastatin     Other reaction(s): Muscle Pain  . Simvastatin     Other reaction(s): Muscle Pain    Current Facility-Administered Medications  Medication Dose Route Frequency Provider Last Rate Last Dose  . 0.9 %  sodium  chloride infusion   Intravenous Continuous Vaughan Basta, MD 150 mL/hr at 12/06/17 0600    . 0.9 %  sodium chloride infusion   Intravenous Once Tukov, Magadalene S, NP      . docusate sodium (COLACE) capsule 100 mg  100 mg Oral BID PRN Vaughan Basta, MD      . fluticasone (FLONASE) 50 MCG/ACT nasal spray 1 spray  1 spray Each Nare Daily Vaughan Basta, MD      . iopamidol (ISOVUE-300) 61 % injection 30 mL  30 mL Oral Once Tukov, Magadalene S, NP      . loratadine (CLARITIN) tablet 10 mg  10 mg Oral Daily PRN Vaughan Basta, MD      . pantoprazole (PROTONIX) injection 40 mg  40 mg Intravenous Q12H Vaughan Basta, MD   40 mg at 12/05/17 2202  . sodium chloride 0.9 % bolus 500 mL  500 mL Intravenous Once PRN Vaughan Basta, MD      . tamsulosin (FLOMAX) capsule 0.4 mg  0.4 mg Oral Daily Vaughan Basta, MD   Stopped at 12/05/17 2146     Review of Systems A multi-point review of systems was asked and was negative except for the findings documented in the HPI  Physical Exam Blood pressure 120/74, pulse 71,  temperature 97.8 F (36.6 C), temperature source Oral, resp. rate 18, height 6\' 1"  (1.854 m), weight 83.8 kg (184 lb 11.9 oz), SpO2 96 %. CONSTITUTIONAL: No acute distress. EYES: Pupils are equal, round, and reactive to light, Sclera are non-icteric. EARS, NOSE, MOUTH AND THROAT: The oropharynx is clear. The oral mucosa is pink and moist. Hearing is intact to voice. LYMPH NODES:  Lymph nodes in the neck are normal. RESPIRATORY:  Lungs are clear. There is normal respiratory effort, with equal breath sounds bilaterally, and without pathologic use of accessory muscles. CARDIOVASCULAR: Heart is regular without murmurs, gallops, or rubs. GI: The abdomen is soft, nontender, and nondistended. There are no palpable masses. There is no hepatosplenomegaly. There are normal bowel sounds in all quadrants. GU: Rectal deferred.   MUSCULOSKELETAL:  Normal muscle strength and tone. No cyanosis or edema.   SKIN: Turgor is good and there are no pathologic skin lesions or ulcers. NEUROLOGIC: Motor and sensation is grossly normal. Cranial nerves are grossly intact. PSYCH:  Oriented to person, place and time. Affect is normal.  Data Reviewed Images and labs reviewed with the most recent H&H of 9.0/26.7.  His platelet count is 137, BUN is 28, sodium 134.  The remainder of his electrolytes are within normal limits.  Bleeding scan is reported in the HPI shows evidence of active bleeding in the left lower quadrant.  CT scan of the abdomen as in the HPI shows fluid within the left hemicolon and diverticulosis.  No free air, free fluid, abscess. I have personally reviewed the patient's imaging, laboratory findings and medical records.    Assessment    GI bleed    Plan    84 year old male with a GI bleed.  Had a long conversation with the patient and the nursing staff about the treatment algorithm for GI bleed.  Discussed that the first-line therapy is resuscitation, the second line therapy is endoscopic approach for attempted control of what is likely a diverticular GI bleed, third line therapy is potential endovascular control of the bleeding vessel, the fourth line therapy is surgical intervention with resection of the bleeding area.  Discussed with the patient that the majority of GI bleeds are controlled long before the need for general surgery.  Reiterated that he may require multiple more blood transfusions throughout this process before it is completed.  No current indications for surgical intervention.  General surgery will continue to follow along.  Recommend refraining from any antiplatelet or anticoagulation medication until evidence of bleeding has stopped.     Time spent with the patient was 80 minutes, with more than 50% of the time spent in face-to-face education, counseling and care coordination.     Clayburn Pert, MD FACS General  Surgeon 12/06/2017, 9:12 AM   \

## 2017-12-06 NOTE — Anesthesia Post-op Follow-up Note (Signed)
Anesthesia QCDR form completed.        

## 2017-12-06 NOTE — Consult Note (Signed)
   General Surgery consult requested secondary to GI bleed  Full consult to follow later today.  Patient seen briefly and he is in no distress.  Chart fully reviewed  Patient has only required 1 unit of PRBC Bleeding scan read as small bowel bleed CT of abdomen with stool in right colon and fluid in left colon and evidence of diverticlulosis  Diverticular bleed is most likely culprit in this setting.  Discussed with patient that if he continues to bleed and they are not able to control bleeding endoscopicly or endovascularly then surgery would be indicated.  Will follow along, but no plans for surgical intervention at this time.  Clayburn Pert, MD [facility]  [facility] [phone] Night [facility] [phone]

## 2017-12-06 NOTE — Progress Notes (Signed)
[facility] [place] NAME: Michael Gilbert    MR#:  [id number]  DATE OF BIRTH:  [birth year]  SUBJECTIVE:  CHIEF COMPLAINT:   Chief Complaint  Patient presents with  . GI Bleeding    Came with GI bleed , had hypotension and recurrent bleeding episodes or transferred to stepdown unit. Overnight he again had a few bowel movements in his hemoglobin continued to drop.  As he was taking aspirin at home, given 2 units of blood transfusion and 1 unit of PRBC so far.  Finished treatment for colonoscopy today morning.  REVIEW OF SYSTEMS:  CONSTITUTIONAL: No fever,positive for fatigue or weakness.  EYES: No blurred or double vision.  EARS, NOSE, AND THROAT: No tinnitus or ear pain.  RESPIRATORY: No cough, shortness of breath, wheezing or hemoptysis.  CARDIOVASCULAR: No chest pain, orthopnea, edema.  GASTROINTESTINAL: No nausea, vomiting, blood in diarrhea , no abdominal pain.  GENITOURINARY: No dysuria, hematuria.  ENDOCRINE: No polyuria, nocturia,  HEMATOLOGY: No anemia, easy bruising or bleeding SKIN: No rash or lesion. MUSCULOSKELETAL: No joint pain or arthritis.   NEUROLOGIC: No tingling, numbness, weakness.  PSYCHIATRY: No anxiety or depression.   ROS  DRUG ALLERGIES:   Allergies  Allergen Reactions  . Ace Inhibitors Cough  . Lovastatin     Other reaction(s): Muscle Pain  . Simvastatin     Other reaction(s): Muscle Pain    VITALS:  Blood pressure 115/68, pulse 68, temperature (!) 96.9 F (36.1 C), temperature source Axillary, resp. rate 17, height 6\' 1"  (1.854 m), weight 83.8 kg (184 lb 11.9 oz), SpO2 94 %.  PHYSICAL EXAMINATION:  GENERAL:  84 y.o.-year-old patient lying in the bed with no acute distress.  EYES: Pupils equal, round, reactive to light and accommodation. No scleral icterus. Extraocular muscles intact. Conjunctiva pale. HEENT: Head atraumatic, normocephalic. Oropharynx and nasopharynx clear.  NECK:  Supple, no jugular  venous distention. No thyroid enlargement, no tenderness.  LUNGS: Normal breath sounds bilaterally, no wheezing, rales,rhonchi or crepitation. No use of accessory muscles of respiration.  CARDIOVASCULAR: S1, S2 normal. No murmurs, rubs, or gallops.  ABDOMEN: Soft, nontender, nondistended. Bowel sounds present. No organomegaly or mass.  EXTREMITIES: No pedal edema, cyanosis, or clubbing.  NEUROLOGIC: Cranial nerves II through XII are intact. Muscle strength 5/5 in all extremities. Sensation intact. Gait not checked.  PSYCHIATRIC: The patient is alert and oriented x 3.  SKIN: No obvious rash, lesion, or ulcer.   Physical Exam LABORATORY PANEL:   CBC Recent Labs  Lab 12/06/17 0440 12/06/17 1005  WBC 7.6  --   HGB 9.0* 7.8*  HCT 26.7*  --   PLT 137*  --    ------------------------------------------------------------------------------------------------------------------  Chemistries  Recent Labs  Lab 12/05/17 1003 12/06/17 0440  NA 136 134*  K 4.4 4.1  CL 104 107  CO2 22 18*  GLUCOSE 107* 114*  BUN 31* 28*  CREATININE 1.38* 1.15  CALCIUM 8.7* 7.5*  MG  --  1.8  AST 69*  --   ALT 24  --   ALKPHOS 64  --   BILITOT 0.7  --    ------------------------------------------------------------------------------------------------------------------  Cardiac Enzymes Recent Labs  Lab 12/05/17 1558  TROPONINI <0.03   ------------------------------------------------------------------------------------------------------------------  RADIOLOGY:  Nm Gi Blood Loss  Result Date: 12/05/2017 CLINICAL DATA:  History of IBS. Bloody stools 8 x day. Hemoglobin drop since patient presents to the ER this morning. Patient is receiving blood. Blood pressure dropped secondary to bleeding episode earlier  today. EXAM: NUCLEAR MEDICINE GASTROINTESTINAL BLEEDING SCAN TECHNIQUE: Sequential abdominal images were obtained following intravenous administration of Tc-69m labeled red blood cells.  RADIOPHARMACEUTICALS:  21.832 mCi Tc-7m pertechnetate in-vitro labeled red cells. COMPARISON:  Chest x-ray 05/18/2016 FINDINGS: Following administration of tagged red blood cells, there is appropriate blood pool activity. On the 2 hour images, there is abnormal activity within the left lower quadrant, projecting in the mid abdomen on the lateral view. Location of the activity is favored to be within the left lower quadrant small bowel loops. IMPRESSION: The study is positive. The most likely source of bleeding is within the left lower quadrant small bowel loops. These results were called by telephone at the time of interpretation on 12/05/2017 at 10:03 pm to [facility], who verbally acknowledged these results. Electronically Signed   By: Nolon Nations M.D.   On: 12/05/2017 22:11   Ct Abdomen Pelvis W Contrast  Result Date: 12/06/2017 CLINICAL DATA:  Bright red blood in stool. Decreased blood pressure. Radionuclide bleeding study yesterday was positive with sore suggested in left lower quadrant small bowel. EXAM: CT ABDOMEN AND PELVIS WITH CONTRAST TECHNIQUE: Multidetector CT imaging of the abdomen and pelvis was performed using the standard protocol following bolus administration of intravenous contrast. CONTRAST:  91mL ISOVUE-300 IOPAMIDOL (ISOVUE-300) INJECTION 61% COMPARISON:  Radionuclide bleeding scan 12/05/2017 FINDINGS: Lower chest: Atelectasis and fibrosis in the lung bases. Hepatobiliary: Multiple subcentimeter low-attenuation lesions throughout the liver are too small to characterize but likely represent cysts or hemangiomas. Gallbladder and bile ducts are unremarkable. Pancreas: Unremarkable. No pancreatic ductal dilatation or surrounding inflammatory changes. Spleen: Normal in size without focal abnormality. Adrenals/Urinary Tract: Adrenal glands are unremarkable. Kidneys are normal, without renal calculi, focal lesion, or hydronephrosis. Bladder is unremarkable. Stomach/Bowel: Stomach, small  bowel, and colon are not abnormally distended. Scattered stool throughout the colon. Scattered colonic diverticula. No evidence of diverticulitis. No focal bowel wall thickening or inflammatory infiltration. Appendix is normal. Small bilateral inguinal hernias containing small bowel but without proximal obstruction. Vascular/Lymphatic: Aortic atherosclerosis. No enlarged abdominal or pelvic lymph nodes. Reproductive: Prostate gland is enlarged, measuring 6.2 cm diameter. Other: No free air or free fluid in the abdomen. Abdominal wall musculature appears intact. Musculoskeletal: Degenerative changes in the spine. No destructive bone lesions. IMPRESSION: 1. No evidence of bowel obstruction or inflammation. Diverticulosis of the colon without evidence of diverticulitis. 2. Small bilateral inguinal hernias containing bowel but without proximal obstruction. 3. Subcentimeter low-attenuation lesions in the liver are too small to characterize but likely represent cysts or hemangiomas. 4. Prostate gland is enlarged. 5. Aortic atherosclerosis. Electronically Signed   By: Lucienne Capers M.D.   On: 12/06/2017 01:40    ASSESSMENT AND PLAN:   Principal Problem:   GI bleed  * GI bleed   Likely lower GI due to diverticuli.   No use of NSAIDs.   Will hold aspirin.   Protonix twice a day for now.   Transfused 2 units of platelets because of taking aspirin before admission, 1 unit of PRBC so far, continue to drop hemoglobin, may require further transfusions.   Appreciated GI, vascular, surgical consult. Plan for colonoscopy today.  * Acute blood loss anemia   Transfused 1 unit of PRBC so far, he may require more if continued to bleed.  * Hypotension   This was secondary to acute GI bleed.    continue on IV fluids.   Hold losartan.  * History of hypertension   As mentioned ago hold losartan.  * BPH  Continue tamsulosin.    All the records are reviewed and case discussed with Care Management/Social  Workerr. Management plans discussed with the patient, family and they are in agreement.  CODE STATUS: Full.  TOTAL TIME TAKING CARE OF THIS PATIENT: 35 minutes.  Discussed with patient and ICU physician.   POSSIBLE D/C IN 1-2 DAYS, DEPENDING ON CLINICAL CONDITION.   Vaughan Basta M.D on 12/06/2017   Between 7am to 6pm - Pager - [phone]  After 6pm go to [email or web address] - password [email or web address] [facility]  Office  [phone]  CC: Primary care physician; Derinda Late, MD  Note: This dictation was prepared with Dragon dictation along with smaller phrase technology. Any transcriptional errors that result from this process are unintentional.

## 2017-12-06 NOTE — Transfer of Care (Signed)
Immediate Anesthesia Transfer of Care Note  Patient: Michael Gilbert  Procedure(s) Performed: COLONOSCOPY WITH PROPOFOL (N/A )  Patient Location: PACU  Anesthesia Type:General  Level of Consciousness: awake, alert  and oriented  Airway & Oxygen Therapy: Patient Spontanous Breathing and Patient connected to nasal cannula oxygen  Post-op Assessment: Report given to RN and Post -op Vital signs reviewed and stable  Post vital signs: Reviewed and stable  Last Vitals:  Vitals:   12/06/17 1000 12/06/17 1554  BP: 115/68 (!) 158/76  Pulse: 68   Resp: 17 15  Temp:    SpO2: 94%     Last Pain:  Vitals:   12/06/17 0945  TempSrc: Axillary  PainSc:          Complications: No apparent anesthesia complications

## 2017-12-06 NOTE — Progress Notes (Signed)
Colonoscopy post procedure note:  Findings: - Two 4 to 5 mm polyps in the transverse colon, removed with a cold snare. Resected and retrieved. - Diverticulosis in the sigmoid colon. - No evidence of active bleeding - He probably had divertucular bleed in sigmoid colon based on bleeding scan secondary to chronic use of aspirin 81mg  BID - The examined portion of the ileum was normal. - Non-bleeding internal hemorrhoids. - The examination was otherwise normal.  Impression: - Return patient to hospital ward for ongoing care. - Resume regular diet today. - Continue present medications. - Recommend to take only asa81 daily instead of BID - Ok to discharge home from GI stand point and f/u in GI clinic in 1-2weeks after discharge - Check ferritin, iron, TIBC, B12 and folate levels - Recommend IR embolization if pt has recurrent diverticular bleed  GI will sign off. Call us back with questions  [name], MD [address]  [place]  [place], [place] [address]  Main: [phone]  Fax: [phone] Pager: [phone]

## 2017-12-06 NOTE — Consult Note (Signed)
PULMONARY / CRITICAL CARE MEDICINE   Name: Michael Gilbert MRN: [id number] DOB: 1928/07/24    ADMISSION DATE:  12/05/2017   CONSULTATION DATE: Dr. Izora Gala MD: Dr. Anselm Jungling   Reason: Acute GI bleed  HISTORY OF PRESENT ILLNESS:   This is a pleasant 84 year old male with a past medical history of diverticulosis, and hypertension who presented to the ED with acute onset rectal bleeding.  He reports having multiple stools prior to admission.  He continues to have bloody stools however his hemoglobin has been stable with just mild decrease.  He received 1 unit of packed red blood cells and was taken down for a bleeding scan with that was positive.  He denies any symptoms.  He had an episode of dizziness with the last large bloody bowel movement and a slight drop in his blood pressure but his blood pressure and symptoms have  resolved.  He has been seen by gastroenterology, vascular surgery and general surgery.  The plan tonight is to transfuse him with platelets, monitor his hemoglobin and give him more packed red blood cells if needed and general surgery will reevaluate him in the morning.  PAST MEDICAL HISTORY :  He  has a past medical history of Allergy, Bradycardia, Colorectal polyps, Diverticulosis, and Hypertension.  PAST SURGICAL HISTORY: He  has a past surgical history that includes Tonsilectomy, adenoidectomy, bilateral myringotomy and tubes and Cataract extraction.  Allergies  Allergen Reactions  . Ace Inhibitors Cough  . Lovastatin     Other reaction(s): Muscle Pain  . Simvastatin     Other reaction(s): Muscle Pain    No current facility-administered medications on file prior to encounter.    Current Outpatient Medications on File Prior to Encounter  Medication Sig  . aspirin EC 81 MG tablet Take 2 tablets by mouth once daily.  Marland Kitchen losartan (COZAAR) 25 MG tablet Take 25 mg by mouth daily.   . Multiple Vitamin (MULTI-VITAMINS) TABS Take 1 tablet by mouth daily.   Marland Kitchen  omeprazole (PRILOSEC) 40 MG capsule Take 40 mg by mouth daily.   . tamsulosin (FLOMAX) 0.4 MG CAPS capsule Take 0.4 mg by mouth daily.   . Acetaminophen 500 MG coapsule Take 5,002 mg by mouth every 6 (six) hours as needed.   . fluticasone (FLONASE) 50 MCG/ACT nasal spray Place 1 spray into both nostrils daily as needed.   . loratadine (CLARITIN) 10 MG tablet Take 10 mg by mouth daily as needed.   . mirabegron ER (MYRBETRIQ) 50 MG TB24 tablet Take 1 tablet (50 mg total) by mouth daily. (Patient not taking: Reported on 11/17/2017)  . oxybutynin (DITROPAN-XL) 10 MG 24 hr tablet Take 1 tablet (10 mg total) by mouth daily.  Marland Kitchen tolterodine (DETROL LA) 4 MG 24 hr capsule Take 1 capsule (4 mg total) by mouth daily.    FAMILY HISTORY:  His indicated that the status of his father is unknown. He indicated that the status of his brother is unknown. He indicated that the status of his neg hx is unknown.   SOCIAL HISTORY: He  reports that he has quit smoking. he has never used smokeless tobacco. He reports that he drinks alcohol. He reports that he does not use drugs.  REVIEW OF SYSTEMS:   Constitutional: Negative for fever and chills.  HENT: Negative for congestion and rhinorrhea.  Eyes: Negative for redness and visual disturbance.  Respiratory: Negative for shortness of breath and wheezing.  Cardiovascular: Negative for chest pain and palpitations.  Gastrointestinal: Negative  for nausea , vomiting and abdominal pain but positive for bloody loose stools Genitourinary: Negative for dysuria and urgency.  Endocrine: Denies polyuria, polyphagia and heat intolerance Musculoskeletal: Negative for myalgias and arthralgias.  Skin: Negative for pallor and wound.  Neurological: Negative for dizziness and headaches   SUBJECTIVE:  Pleasant, awake and in no distress  VITAL SIGNS: BP 120/74   Pulse 71   Temp 98 F (36.7 C) (Oral)   Resp 18   Ht 6\' 1"  (1.854 m)   Wt 83.8 kg (184 lb 11.9 oz)   SpO2 96%    BMI 24.37 kg/m   HEMODYNAMICS:    VENTILATOR SETTINGS:    INTAKE / OUTPUT: I/O last 3 completed shifts: In: 9379 [I.V.:1740; Blood:720; IV Piggyback:1000] Out: 1370 [Urine:1170; Stool:200]  PHYSICAL EXAMINATION:  General:  NAD, appears younger for age Neuro: Alert and oriented x3 no focal deficits HEENT: PERRLA, no JVD, trachea midline Cardiovascular: Apical pulse regular, S1-S2, no murmur regurg or gallop, no edema, +2 pulses bilaterally Lungs: Clear to auscultation bilaterally Abdomen: Nondistended, normal bowel sounds in all 4 quadrants Musculoskeletal: No joint deformities, positive range of motion in upper and lower extremities Skin: warm and dry  LABS:  BMET Recent Labs  Lab 12/05/17 1003 12/06/17 0440  NA 136 134*  K 4.4 4.1  CL 104 107  CO2 22 18*  BUN 31* 28*  CREATININE 1.38* 1.15  GLUCOSE 107* 114*    Electrolytes Recent Labs  Lab 12/05/17 1003 12/06/17 0440  CALCIUM 8.7* 7.5*  MG  --  1.8  PHOS  --  2.9    CBC Recent Labs  Lab 12/05/17 1003  12/05/17 1528 12/05/17 2242 12/06/17 0440  WBC 6.8  --   --  7.4 7.6  HGB 12.6*   < > 10.5* 10.7*  10.5* 9.0*  HCT 36.8*  --   --  31.8* 26.7*  PLT 177  --   --  143* 137*   < > = values in this interval not displayed.    Coag's Recent Labs  Lab 12/05/17 1004 12/05/17 1124  INR 1.01 1.06    Sepsis Markers No results for input(s): LATICACIDVEN, PROCALCITON, O2SATVEN in the last 168 hours.  ABG No results for input(s): PHART, PCO2ART, PO2ART in the last 168 hours.  Liver Enzymes Recent Labs  Lab 12/05/17 1003  AST 69*  ALT 24  ALKPHOS 64  BILITOT 0.7  ALBUMIN 3.5    Cardiac Enzymes Recent Labs  Lab 12/05/17 1558  TROPONINI <0.03    Glucose Recent Labs  Lab 12/05/17 1539  GLUCAP 110*    Imaging Nm Gi Blood Loss  Result Date: 12/05/2017 CLINICAL DATA:  History of IBS. Bloody stools 8 x day. Hemoglobin drop since patient presents to the ER this morning. Patient is  receiving blood. Blood pressure dropped secondary to bleeding episode earlier today. EXAM: NUCLEAR MEDICINE GASTROINTESTINAL BLEEDING SCAN TECHNIQUE: Sequential abdominal images were obtained following intravenous administration of Tc-52m labeled red blood cells. RADIOPHARMACEUTICALS:  21.832 mCi Tc-53m pertechnetate in-vitro labeled red cells. COMPARISON:  Chest x-ray 05/18/2016 FINDINGS: Following administration of tagged red blood cells, there is appropriate blood pool activity. On the 2 hour images, there is abnormal activity within the left lower quadrant, projecting in the mid abdomen on the lateral view. Location of the activity is favored to be within the left lower quadrant small bowel loops. IMPRESSION: The study is positive. The most likely source of bleeding is within the left lower quadrant small bowel loops. These  results were called by telephone at the time of interpretation on 12/05/2017 at 10:03 pm to [facility], who verbally acknowledged these results. Electronically Signed   By: Nolon Nations M.D.   On: 12/05/2017 22:11   Ct Abdomen Pelvis W Contrast  Result Date: 12/06/2017 CLINICAL DATA:  Bright red blood in stool. Decreased blood pressure. Radionuclide bleeding study yesterday was positive with sore suggested in left lower quadrant small bowel. EXAM: CT ABDOMEN AND PELVIS WITH CONTRAST TECHNIQUE: Multidetector CT imaging of the abdomen and pelvis was performed using the standard protocol following bolus administration of intravenous contrast. CONTRAST:  47mL ISOVUE-300 IOPAMIDOL (ISOVUE-300) INJECTION 61% COMPARISON:  Radionuclide bleeding scan 12/05/2017 FINDINGS: Lower chest: Atelectasis and fibrosis in the lung bases. Hepatobiliary: Multiple subcentimeter low-attenuation lesions throughout the liver are too small to characterize but likely represent cysts or hemangiomas. Gallbladder and bile ducts are unremarkable. Pancreas: Unremarkable. No pancreatic ductal dilatation or  surrounding inflammatory changes. Spleen: Normal in size without focal abnormality. Adrenals/Urinary Tract: Adrenal glands are unremarkable. Kidneys are normal, without renal calculi, focal lesion, or hydronephrosis. Bladder is unremarkable. Stomach/Bowel: Stomach, small bowel, and colon are not abnormally distended. Scattered stool throughout the colon. Scattered colonic diverticula. No evidence of diverticulitis. No focal bowel wall thickening or inflammatory infiltration. Appendix is normal. Small bilateral inguinal hernias containing small bowel but without proximal obstruction. Vascular/Lymphatic: Aortic atherosclerosis. No enlarged abdominal or pelvic lymph nodes. Reproductive: Prostate gland is enlarged, measuring 6.2 cm diameter. Other: No free air or free fluid in the abdomen. Abdominal wall musculature appears intact. Musculoskeletal: Degenerative changes in the spine. No destructive bone lesions. IMPRESSION: 1. No evidence of bowel obstruction or inflammation. Diverticulosis of the colon without evidence of diverticulitis. 2. Small bilateral inguinal hernias containing bowel but without proximal obstruction. 3. Subcentimeter low-attenuation lesions in the liver are too small to characterize but likely represent cysts or hemangiomas. 4. Prostate gland is enlarged. 5. Aortic atherosclerosis. Electronically Signed   By: Lucienne Capers M.D.   On: 12/06/2017 01:40    STUDIES:  Colonoscopy pending  CULTURES: None  ANTIBIOTICS: None  SIGNIFICANT EVENTS: 12/05/2017: Admitted with acute GI bleed  LINES/TUBES: Peripheral IVs  DISCUSSION: 84 year old Caucasian male presenting with acute lower GI bleed.  Bleeding scan positive, CT abdomen shows diverticulosis without diverticulitis; likely etiology is a diverticular bleed.  His hemoglobin continues to trend down but he remains hemodynamically stable  ASSESSMENT / PLAN:  GASTROINTESTINAL A:   Acute lower GI bleed-bleeding scan positive P:    General surgery, vascular surgery and gastroenterology following Colonoscopy planned for tomorrow Keep n.p.o. Start CT abdomen and pelvis with oral contrast   CARDIOVASCULAR A:  History of hypertension History of bradycardia High risk for volume depletion secondary to active GI bleed P:  Hemodynamic monitoring per ICU protocol Volume replacement Hold antihypertensives  RENAL A:   No acute issues P:   Monitor and replace electrolytes  HEMATOLOGIC A:   Acute blood loss anemia-hemoglobin trending down Platelet dysfunction secondary to aspirin use P:  Trend hemoglobin every 6 hours and transfuse as needed Transfuse platelets per vascular surgery/gen surgery recommendations  INFECTIOUS A:   No acute issues P:   Monitor fever curve   FAMILY  - Updates: Patient and family updated at bedside. - Inter-disciplinary family meet or Palliative Care meeting due by:  day 7   Gessica Jawad S. [facility] ANP-BC Pulmonary and Critical Care Medicine [facility] Pager [phone] or [phone]  NB: This document was prepared using Systems analyst and may  include unintentional dictation errors.    12/06/2017, 7:55 AM

## 2017-12-07 ENCOUNTER — Inpatient Hospital Stay: Payer: Medicare Other

## 2017-12-07 DIAGNOSIS — K922 Gastrointestinal hemorrhage, unspecified: Secondary | ICD-10-CM

## 2017-12-07 DIAGNOSIS — K5791 Diverticulosis of intestine, part unspecified, without perforation or abscess with bleeding: Secondary | ICD-10-CM

## 2017-12-07 DIAGNOSIS — D62 Acute posthemorrhagic anemia: Secondary | ICD-10-CM

## 2017-12-07 DIAGNOSIS — K625 Hemorrhage of anus and rectum: Secondary | ICD-10-CM

## 2017-12-07 LAB — PREPARE PLATELET PHERESIS
UNIT DIVISION: 0
Unit division: 0

## 2017-12-07 LAB — BASIC METABOLIC PANEL
Anion gap: 6 (ref 5–15)
BUN: 14 mg/dL (ref 6–20)
CO2: 19 mmol/L — AB (ref 22–32)
Calcium: 7.9 mg/dL — ABNORMAL LOW (ref 8.9–10.3)
Chloride: 107 mmol/L (ref 101–111)
Creatinine, Ser: 1.36 mg/dL — ABNORMAL HIGH (ref 0.61–1.24)
GFR calc non Af Amer: 45 mL/min — ABNORMAL LOW (ref >60)
GFR, EST AFRICAN AMERICAN: 52 mL/min — AB (ref >60)
GLUCOSE: 118 mg/dL — AB (ref 65–99)
POTASSIUM: 3.4 mmol/L — AB (ref 3.5–5.1)
Sodium: 132 mmol/L — ABNORMAL LOW (ref 135–145)

## 2017-12-07 LAB — PHOSPHORUS: PHOSPHORUS: 2.7 mg/dL (ref 2.5–4.6)

## 2017-12-07 LAB — BPAM PLATELET PHERESIS
BLOOD PRODUCT EXPIRATION DATE: 201902030413
BLOOD PRODUCT EXPIRATION DATE: 201902042359
ISSUE DATE / TIME: 201902020436
ISSUE DATE / TIME: 201902020727
UNIT TYPE AND RH: 5100
Unit Type and Rh: 5100

## 2017-12-07 LAB — CBC
HEMATOCRIT: 24.7 % — AB (ref 40.0–52.0)
Hemoglobin: 8.4 g/dL — ABNORMAL LOW (ref 13.0–18.0)
MCH: 30 pg (ref 26.0–34.0)
MCHC: 34 g/dL (ref 32.0–36.0)
MCV: 88.2 fL (ref 80.0–100.0)
PLATELETS: 191 10*3/uL (ref 150–440)
RBC: 2.8 MIL/uL — AB (ref 4.40–5.90)
RDW: 16 % — ABNORMAL HIGH (ref 11.5–14.5)
WBC: 8.3 10*3/uL (ref 3.8–10.6)

## 2017-12-07 LAB — VITAMIN B12: Vitamin B-12: 292 pg/mL (ref 180–914)

## 2017-12-07 LAB — MAGNESIUM: Magnesium: 2 mg/dL (ref 1.7–2.4)

## 2017-12-07 MED ORDER — OXYBUTYNIN CHLORIDE ER 10 MG PO TB24
10.0000 mg | ORAL_TABLET | Freq: Every morning | ORAL | Status: DC
  Administered 2017-12-07: 10 mg via ORAL
  Filled 2017-12-07 (×2): qty 1

## 2017-12-07 MED ORDER — POTASSIUM CHLORIDE 20 MEQ/15ML (10%) PO SOLN
40.0000 meq | Freq: Once | ORAL | Status: AC
  Administered 2017-12-07: 40 meq via ORAL
  Filled 2017-12-07: qty 30

## 2017-12-07 MED ORDER — SIMETHICONE 80 MG PO CHEW
80.0000 mg | CHEWABLE_TABLET | Freq: Four times a day (QID) | ORAL | Status: DC | PRN
  Filled 2017-12-07: qty 1

## 2017-12-07 MED ORDER — PANTOPRAZOLE SODIUM 40 MG PO TBEC
40.0000 mg | DELAYED_RELEASE_TABLET | Freq: Every day | ORAL | Status: DC
  Administered 2017-12-08: 10:00:00 40 mg via ORAL
  Filled 2017-12-07: qty 1

## 2017-12-07 MED ORDER — FERROUS SULFATE 325 (65 FE) MG PO TABS
325.0000 mg | ORAL_TABLET | Freq: Two times a day (BID) | ORAL | Status: DC
  Administered 2017-12-07 – 2017-12-08 (×3): 325 mg via ORAL
  Filled 2017-12-07 (×3): qty 1

## 2017-12-07 NOTE — Progress Notes (Signed)
Patient alert and oriented today, reporting lower/mid "abdominal pain" resolved after intermittent urinary cath. I&O cath several times today due to patient inability to void, MD aware and hospitalist reordered home medication. Patient in and out of irregular heart rhythms today, MD notified, potassium replaced, will continue to monitor, patient remained asymptomatic during episodes. Family at bedside, educated patient and family concerning inability to void and intermittent cath. Transferred to room 116, report called to [facility]. Patient IO cath before transfer, bedside update given to [facility].

## 2017-12-07 NOTE — Progress Notes (Signed)
Patient feeling the "urge" to urinate, requesting time to see if he is able to void before performing an in and out cath. Madlyn Frankel, RN

## 2017-12-07 NOTE — Progress Notes (Signed)
Patient alert and oriented, reporting pain with bladder distention x 2 episodes today. Patient with urinary retention, unable to void since oncoming to shift. In and out cath x 2 with significant urine return. Discussed with Dr Celesta Aver and Dr Boyce Medici, order to continue to IO cath as needed for urinary retention. Educated patient and family. Plan to ambulate patient in hallway at lunchtime.

## 2017-12-07 NOTE — Progress Notes (Signed)
Patient in and out of NSR, heart block, having PVCs etc as notified by telemetry monitoring and seen on monitor. Dr Celesta Aver and Marthann Schiller notified. Patient asymptomatic, replacing potassium, will continue to monitor.

## 2017-12-07 NOTE — Progress Notes (Signed)
[place] [facility] [place] NAME: Michael Gilbert    MR#:  [id number]  DATE OF BIRTH:  1928/03/29  SUBJECTIVE:  CHIEF COMPLAINT:   Chief Complaint  Patient presents with  . GI Bleeding    Came with GI bleed , had hypotension and recurrent bleeding episodes or transferred to stepdown unit. Overnight he again had a few bowel movements in his hemoglobin continued to drop.  As he was taking aspirin at home, given 2 units of blood transfusion and 1 unit of PRBC so far. As per colonoscopy, diverticuli, no active bleed.   Tolerating diet, but has urinary retention today as his BPH medications were held for last 2 days.  REVIEW OF SYSTEMS:  CONSTITUTIONAL: No fever,positive for fatigue or weakness.  EYES: No blurred or double vision.  EARS, NOSE, AND THROAT: No tinnitus or ear pain.  RESPIRATORY: No cough, shortness of breath, wheezing or hemoptysis.  CARDIOVASCULAR: No chest pain, orthopnea, edema.  GASTROINTESTINAL: No nausea, vomiting, blood in diarrhea , no abdominal pain.  GENITOURINARY: No dysuria, hematuria.  ENDOCRINE: No polyuria, nocturia,  HEMATOLOGY: No anemia, easy bruising or bleeding SKIN: No rash or lesion. MUSCULOSKELETAL: No joint pain or arthritis.   NEUROLOGIC: No tingling, numbness, weakness.  PSYCHIATRY: No anxiety or depression.   ROS  DRUG ALLERGIES:   Allergies  Allergen Reactions  . Ace Inhibitors Cough  . Lovastatin     Other reaction(s): Muscle Pain  . Simvastatin     Other reaction(s): Muscle Pain    VITALS:  Blood pressure 128/64, pulse 70, temperature 97.6 F (36.4 C), temperature source Oral, resp. rate 18, height 6\' 1"  (1.854 m), weight 83.8 kg (184 lb 11.9 oz), SpO2 95 %.  PHYSICAL EXAMINATION:  GENERAL:  84 y.o.-year-old patient lying in the bed with no acute distress.  EYES: Pupils equal, round, reactive to light and accommodation. No scleral icterus. Extraocular muscles intact. Conjunctiva pale. HEENT: Head  atraumatic, normocephalic. Oropharynx and nasopharynx clear.  NECK:  Supple, no jugular venous distention. No thyroid enlargement, no tenderness.  LUNGS: Normal breath sounds bilaterally, no wheezing, rales,rhonchi or crepitation. No use of accessory muscles of respiration.  CARDIOVASCULAR: S1, S2 normal. No murmurs, rubs, or gallops.  ABDOMEN: Soft, nontender, nondistended. Bowel sounds present. No organomegaly or mass.  EXTREMITIES: No pedal edema, cyanosis, or clubbing.  NEUROLOGIC: Cranial nerves II through XII are intact. Muscle strength 5/5 in all extremities. Sensation intact. Gait not checked.  PSYCHIATRIC: The patient is alert and oriented x 3.  SKIN: No obvious rash, lesion, or ulcer.   Physical Exam LABORATORY PANEL:   CBC Recent Labs  Lab 12/07/17 0908  WBC 8.3  HGB 8.4*  HCT 24.7*  PLT 191   ------------------------------------------------------------------------------------------------------------------  Chemistries  Recent Labs  Lab 12/05/17 1003  12/07/17 0908  NA 136   < > 132*  K 4.4   < > 3.4*  CL 104   < > 107  CO2 22   < > 19*  GLUCOSE 107*   < > 118*  BUN 31*   < > 14  CREATININE 1.38*   < > 1.36*  CALCIUM 8.7*   < > 7.9*  MG  --    < > 2.0  AST 69*  --   --   ALT 24  --   --   ALKPHOS 64  --   --   BILITOT 0.7  --   --    < > = values in  this interval not displayed.   ------------------------------------------------------------------------------------------------------------------  Cardiac Enzymes Recent Labs  Lab 12/05/17 1558  TROPONINI <0.03   ------------------------------------------------------------------------------------------------------------------  RADIOLOGY:  Dg Abd 1 View  Result Date: 12/07/2017 CLINICAL DATA:  Abdominal distention. EXAM: ABDOMEN - 1 VIEW COMPARISON:  Body CT 12/06/2017 FINDINGS: The bowel gas pattern is nonobstructive. Nonspecific gaseous distension of the colon. No radio-opaque calculi or other  significant radiographic abnormality are seen. IMPRESSION: Nonspecific gaseous distension of the colon. Nonobstructive bowel gas pattern. Electronically Signed   By: Fidela Salisbury M.D.   On: 12/07/2017 07:34   Nm Gi Blood Loss  Result Date: 12/05/2017 CLINICAL DATA:  History of IBS. Bloody stools 8 x day. Hemoglobin drop since patient presents to the ER this morning. Patient is receiving blood. Blood pressure dropped secondary to bleeding episode earlier today. EXAM: NUCLEAR MEDICINE GASTROINTESTINAL BLEEDING SCAN TECHNIQUE: Sequential abdominal images were obtained following intravenous administration of Tc-60m labeled red blood cells. RADIOPHARMACEUTICALS:  21.832 mCi Tc-64m pertechnetate in-vitro labeled red cells. COMPARISON:  Chest x-ray 05/18/2016 FINDINGS: Following administration of tagged red blood cells, there is appropriate blood pool activity. On the 2 hour images, there is abnormal activity within the left lower quadrant, projecting in the mid abdomen on the lateral view. Location of the activity is favored to be within the left lower quadrant small bowel loops. IMPRESSION: The study is positive. The most likely source of bleeding is within the left lower quadrant small bowel loops. These results were called by telephone at the time of interpretation on 12/05/2017 at 10:03 pm to [facility], who verbally acknowledged these results. Electronically Signed   By: Nolon Nations M.D.   On: 12/05/2017 22:11   Ct Abdomen Pelvis W Contrast  Result Date: 12/06/2017 CLINICAL DATA:  Bright red blood in stool. Decreased blood pressure. Radionuclide bleeding study yesterday was positive with sore suggested in left lower quadrant small bowel. EXAM: CT ABDOMEN AND PELVIS WITH CONTRAST TECHNIQUE: Multidetector CT imaging of the abdomen and pelvis was performed using the standard protocol following bolus administration of intravenous contrast. CONTRAST:  95mL ISOVUE-300 IOPAMIDOL (ISOVUE-300) INJECTION  61% COMPARISON:  Radionuclide bleeding scan 12/05/2017 FINDINGS: Lower chest: Atelectasis and fibrosis in the lung bases. Hepatobiliary: Multiple subcentimeter low-attenuation lesions throughout the liver are too small to characterize but likely represent cysts or hemangiomas. Gallbladder and bile ducts are unremarkable. Pancreas: Unremarkable. No pancreatic ductal dilatation or surrounding inflammatory changes. Spleen: Normal in size without focal abnormality. Adrenals/Urinary Tract: Adrenal glands are unremarkable. Kidneys are normal, without renal calculi, focal lesion, or hydronephrosis. Bladder is unremarkable. Stomach/Bowel: Stomach, small bowel, and colon are not abnormally distended. Scattered stool throughout the colon. Scattered colonic diverticula. No evidence of diverticulitis. No focal bowel wall thickening or inflammatory infiltration. Appendix is normal. Small bilateral inguinal hernias containing small bowel but without proximal obstruction. Vascular/Lymphatic: Aortic atherosclerosis. No enlarged abdominal or pelvic lymph nodes. Reproductive: Prostate gland is enlarged, measuring 6.2 cm diameter. Other: No free air or free fluid in the abdomen. Abdominal wall musculature appears intact. Musculoskeletal: Degenerative changes in the spine. No destructive bone lesions. IMPRESSION: 1. No evidence of bowel obstruction or inflammation. Diverticulosis of the colon without evidence of diverticulitis. 2. Small bilateral inguinal hernias containing bowel but without proximal obstruction. 3. Subcentimeter low-attenuation lesions in the liver are too small to characterize but likely represent cysts or hemangiomas. 4. Prostate gland is enlarged. 5. Aortic atherosclerosis. Electronically Signed   By: Lucienne Capers M.D.   On: 12/06/2017 01:40    ASSESSMENT AND  PLAN:   Principal Problem:   GI bleed Active Problems:   Hemorrhage of rectum and anus   Diverticulosis of colon (without mention of  hemorrhage)   Acute blood loss anemia  * GI bleed   lower GI due to diverticuli.   No use of NSAIDs.   Will hold aspirin.   Protonix twice a day for now.   Transfused 2 units of platelets because of taking aspirin before admission, 1 unit of PRBC so far,   Hb stable. Need oral iron now.   Appreciated GI, vascular, surgical consult.    Colonoscopy showed diverticuli and 2 polyps which were biopsied and removed.   Tolerating diet, following GI clinic.  * Acute blood loss anemia   Transfused 1 unit of PRBC so far, he may require more if continued to bleed.   Stable now, iron supplementation now and on discharge.  * Hypotension   This was secondary to acute GI bleed.    continue on IV fluids.   Hold losartan.  * History of hypertension   As mentioned ago hold losartan.  * BPH With urinary retention    Continue tamsulosin.   Started on oxybutynin.   His medications were held for last 2 days due to GI bleed, he has urinary retention and requiring in and out catheter today.   Encourage ambulation, we would like to monitor in hospital until he is able to have urination.  * Complain of frequent gas, bloating sensation, liquid stools in last few weeks to months.    I encouraged to try less dairy Products , and discussed this with his follow-up in GI clinic.   All the records are reviewed and case discussed with Care Management/Social Workerr. Management plans discussed with the patient, family and they are in agreement.  CODE STATUS: Full.  TOTAL TIME TAKING CARE OF THIS PATIENT: 35 minutes.  Discussed with patient and his family.   POSSIBLE D/C IN 1-2 DAYS, DEPENDING ON CLINICAL CONDITION.   Vaughan Basta M.D on 12/07/2017   Between 7am to 6pm - Pager - [phone]  After 6pm go to [email or web address] - password [email or web address] [facility]  Office  [phone]  CC: Primary care physician; Derinda Late, MD  Note: This dictation was prepared with  Dragon dictation along with smaller phrase technology. Any transcriptional errors that result from this process are unintentional.

## 2017-12-07 NOTE — Progress Notes (Signed)
[facility]  Daily Progress Note   Subjective  - 1 Day Post-Op  Patient denies abdominal pain no further bloody bowel movements since yesterday  Objective Vitals:   12/07/17 1100 12/07/17 1200 12/07/17 1300 12/07/17 1400  BP: 131/77 134/76 115/65 128/64  Pulse: 73 85 73 70  Resp: 19 15 16 18   Temp:      TempSrc:      SpO2: 95% 94% 96% 95%  Weight:      Height:        Intake/Output Summary (Last 24 hours) at 12/07/2017 1504 Last data filed at 12/07/2017 1338 Gross per 24 hour  Intake 4522.5 ml  Output 2800 ml  Net 1722.5 ml    PULM  Normal effort , no use of accessory muscles CV  No JVD, RRR Abd      Nondistended, nontender Neuro awake and alert appropriate  Laboratory CBC    Component Value Date/Time   WBC 8.3 12/07/2017 0908   HGB 8.4 (L) 12/07/2017 0908   HCT 24.7 (L) 12/07/2017 0908   PLT 191 12/07/2017 0908    BMET    Component Value Date/Time   NA 132 (L) 12/07/2017 0908   K 3.4 (L) 12/07/2017 0908   CL 107 12/07/2017 0908   CO2 19 (L) 12/07/2017 0908   GLUCOSE 118 (H) 12/07/2017 0908   BUN 14 12/07/2017 0908   CREATININE 1.36 (H) 12/07/2017 0908   CALCIUM 7.9 (L) 12/07/2017 0908   GFRNONAA 45 (L) 12/07/2017 0908   GFRAA 52 (L) 12/07/2017 0908    Assessment/Planning:  Acute GI bleed: CT scan suggests patient has a very redundant sigmoid and that the bleeding is actually from the sigmoid colon.    Colonoscopy revealed diverticulosis of the sigmoid colon and 2 small polyps which were biopsied.  No active bleeding was identified.  The patient has likely had an acute diverticular bleed which is now stopped he will continue his medical management and follow-up with GI.  No indication for embolization.  I will sign off.    Hortencia Pilar  12/07/2017, 3:04 PM

## 2017-12-07 NOTE — Progress Notes (Signed)
PULMONARY / CRITICAL CARE MEDICINE   Name: Michael Gilbert MRN: [id number] DOB: [birth year]    ADMISSION DATE:  12/05/2017   HISTORY OF PRESENT ILLNESS:   This is a pleasant 84 year old male with a past medical history of diverticulosis, and hypertension who presented to the ED with acute onset rectal bleeding.  He is post Colonoscopy day 1 and has had no further hematochezia    REVIEW OF SYSTEMS:   Urinary retension  SUBJECTIVE:  Patient reported back and abdominal pain in early am.  After in and out foley and urinary output, pain was resolved.  VITAL SIGNS: BP 128/64   Pulse 70   Temp 97.6 F (36.4 C) (Oral)   Resp 18   Ht 6\' 1"  (1.854 m)   Wt 184 lb 11.9 oz (83.8 kg)   SpO2 95%   BMI 24.37 kg/m   HEMODYNAMICS:  no compromise  VENTILATOR SETTINGS:    INTAKE / OUTPUT: I/O last 3 completed shifts: In: 6830 [I.V.:5530; Blood:1300] Out: 3532 [Urine:2845; DJMEQ:6834]  PHYSICAL EXAMINATION: General:  Comfortable without distress Neuro:  AAOx4, no deficits HEENT:  PERRL Cardiovascular:  RRR Lungs:  CTA Abdomen:  Soft, NT Musculoskeletal:  No deformities Skin:  Warm and dry  LABS:  BMET Recent Labs  Lab 12/05/17 1003 12/06/17 0440 12/07/17 0908  NA 136 134* 132*  K 4.4 4.1 3.4*  CL 104 107 107  CO2 22 18* 19*  BUN 31* 28* 14  CREATININE 1.38* 1.15 1.36*  GLUCOSE 107* 114* 118*    Electrolytes Recent Labs  Lab 12/05/17 1003 12/06/17 0440 12/07/17 0908  CALCIUM 8.7* 7.5* 7.9*  MG  --  1.8 2.0  PHOS  --  2.9 2.7    CBC Recent Labs  Lab 12/05/17 2242 12/06/17 0440  12/06/17 1704 12/06/17 2152 12/07/17 0908  WBC 7.4 7.6  --   --   --  8.3  HGB 10.7*  10.5* 9.0*   < > 8.1* 7.5* 8.4*  HCT 31.8* 26.7*  --   --   --  24.7*  PLT 143* 137*  --   --   --  191   < > = values in this interval not displayed.    Coag's Recent Labs  Lab 12/05/17 1004 12/05/17 1124  INR 1.01 1.06    Sepsis Markers No results for input(s): LATICACIDVEN,  PROCALCITON, O2SATVEN in the last 168 hours.  ABG No results for input(s): PHART, PCO2ART, PO2ART in the last 168 hours.  Liver Enzymes Recent Labs  Lab 12/05/17 1003  AST 69*  ALT 24  ALKPHOS 64  BILITOT 0.7  ALBUMIN 3.5    Cardiac Enzymes Recent Labs  Lab 12/05/17 1558  TROPONINI <0.03    Glucose Recent Labs  Lab 12/05/17 1539  GLUCAP 110*    Imaging Dg Abd 1 View  Result Date: 12/07/2017 CLINICAL DATA:  Abdominal distention. EXAM: ABDOMEN - 1 VIEW COMPARISON:  Body CT 12/06/2017 FINDINGS: The bowel gas pattern is nonobstructive. Nonspecific gaseous distension of the colon. No radio-opaque calculi or other significant radiographic abnormality are seen. IMPRESSION: Nonspecific gaseous distension of the colon. Nonobstructive bowel gas pattern. Electronically Signed   By: Fidela Salisbury M.D.   On: 12/07/2017 07:34     STUDIES:  Colonoscopy Bleeding scan  CULTURES: none  ANTIBIOTICS: None  SIGNIFICANT EVENTS: No acute events in the night except for urinarry retension  LINES/TUBES: Peripherial  DISCUSSION: 84 year old Caucasian male presenting with acute lower GI bleed. Bleeding scan positive. Patient subsequently had  colonoscopy.  Please see GI note.  ASSESSMENT / PLAN:  1. Acute lower GI bleed with no further episodes post colonoscopy. Tolerating diet 2. Diverticulosis 3. Transverse Colon Polyp S/P Polypectomy 4. Acute Blood loss anemia 5. Hypokalemia 6. Urinary retention patient is known to have BPH  Plan 1. For GI follow up in out Pt 2. Potassium replacement 3. PPI 4. In and out foley prn.  Flomax has been restarted 5. Ambulation  Family updated.  Disp: transfer out of ICU.    Cammie Sickle, MD Pulmonary and [place] Pager: [phone]  12/07/2017, 3:37 PM

## 2017-12-07 NOTE — Progress Notes (Signed)
CC: Lower GI bleed Subjective: This patient with a lower GI bleed.  Patient feels well today and has noticed no other additional bleeding. Patient had a colonoscopy yesterday that showed no active bleeding. Objective: Vital signs in last 24 hours: Temp:  [96.6 F (35.9 C)-97.9 F (36.6 C)] 97.6 F (36.4 C) (02/03 0717) Pulse Rate:  [28-91] 79 (02/03 0900) Resp:  [14-21] 14 (02/03 0900) BP: (115-163)/(49-91) 126/68 (02/03 0900) SpO2:  [88 %-100 %] 100 % (02/03 0900) Last BM Date: 12/06/17  Intake/Output from previous day: 02/02 0701 - 02/03 0700 In: 4380 [I.V.:3800; Blood:580] Out: 2825 [Urine:1675; Stool:1150] Intake/Output this shift: Total I/O In: 482.5 [I.V.:482.5] Out: 850 [Urine:850]  Physical exam:  Abdomen is soft and nontender vital signs reviewed and stable  Lab Results: CBC  Recent Labs    12/05/17 2242 12/06/17 0440  12/06/17 1704 12/06/17 2152  WBC 7.4 7.6  --   --   --   HGB 10.7*  10.5* 9.0*   < > 8.1* 7.5*  HCT 31.8* 26.7*  --   --   --   PLT 143* 137*  --   --   --    < > = values in this interval not displayed.   BMET Recent Labs    12/05/17 1003 12/06/17 0440  NA 136 134*  K 4.4 4.1  CL 104 107  CO2 22 18*  GLUCOSE 107* 114*  BUN 31* 28*  CREATININE 1.38* 1.15  CALCIUM 8.7* 7.5*   PT/INR Recent Labs    12/05/17 1004 12/05/17 1124  LABPROT 13.2 13.7  INR 1.01 1.06   ABG No results for input(s): PHART, HCO3 in the last 72 hours.  Invalid input(s): PCO2, PO2  Studies/Results: Dg Abd 1 View  Result Date: 12/07/2017 CLINICAL DATA:  Abdominal distention. EXAM: ABDOMEN - 1 VIEW COMPARISON:  Body CT 12/06/2017 FINDINGS: The bowel gas pattern is nonobstructive. Nonspecific gaseous distension of the colon. No radio-opaque calculi or other significant radiographic abnormality are seen. IMPRESSION: Nonspecific gaseous distension of the colon. Nonobstructive bowel gas pattern. Electronically Signed   By: Fidela Salisbury M.D.   On:  12/07/2017 07:34   Nm Gi Blood Loss  Result Date: 12/05/2017 CLINICAL DATA:  History of IBS. Bloody stools 8 x day. Hemoglobin drop since patient presents to the ER this morning. Patient is receiving blood. Blood pressure dropped secondary to bleeding episode earlier today. EXAM: NUCLEAR MEDICINE GASTROINTESTINAL BLEEDING SCAN TECHNIQUE: Sequential abdominal images were obtained following intravenous administration of Tc-38m labeled red blood cells. RADIOPHARMACEUTICALS:  21.832 mCi Tc-57m pertechnetate in-vitro labeled red cells. COMPARISON:  Chest x-ray 05/18/2016 FINDINGS: Following administration of tagged red blood cells, there is appropriate blood pool activity. On the 2 hour images, there is abnormal activity within the left lower quadrant, projecting in the mid abdomen on the lateral view. Location of the activity is favored to be within the left lower quadrant small bowel loops. IMPRESSION: The study is positive. The most likely source of bleeding is within the left lower quadrant small bowel loops. These results were called by telephone at the time of interpretation on 12/05/2017 at 10:03 pm to [facility], who verbally acknowledged these results. Electronically Signed   By: Nolon Nations M.D.   On: 12/05/2017 22:11   Ct Abdomen Pelvis W Contrast  Result Date: 12/06/2017 CLINICAL DATA:  Bright red blood in stool. Decreased blood pressure. Radionuclide bleeding study yesterday was positive with sore suggested in left lower quadrant small bowel. EXAM: CT ABDOMEN AND  PELVIS WITH CONTRAST TECHNIQUE: Multidetector CT imaging of the abdomen and pelvis was performed using the standard protocol following bolus administration of intravenous contrast. CONTRAST:  46mL ISOVUE-300 IOPAMIDOL (ISOVUE-300) INJECTION 61% COMPARISON:  Radionuclide bleeding scan 12/05/2017 FINDINGS: Lower chest: Atelectasis and fibrosis in the lung bases. Hepatobiliary: Multiple subcentimeter low-attenuation lesions throughout the  liver are too small to characterize but likely represent cysts or hemangiomas. Gallbladder and bile ducts are unremarkable. Pancreas: Unremarkable. No pancreatic ductal dilatation or surrounding inflammatory changes. Spleen: Normal in size without focal abnormality. Adrenals/Urinary Tract: Adrenal glands are unremarkable. Kidneys are normal, without renal calculi, focal lesion, or hydronephrosis. Bladder is unremarkable. Stomach/Bowel: Stomach, small bowel, and colon are not abnormally distended. Scattered stool throughout the colon. Scattered colonic diverticula. No evidence of diverticulitis. No focal bowel wall thickening or inflammatory infiltration. Appendix is normal. Small bilateral inguinal hernias containing small bowel but without proximal obstruction. Vascular/Lymphatic: Aortic atherosclerosis. No enlarged abdominal or pelvic lymph nodes. Reproductive: Prostate gland is enlarged, measuring 6.2 cm diameter. Other: No free air or free fluid in the abdomen. Abdominal wall musculature appears intact. Musculoskeletal: Degenerative changes in the spine. No destructive bone lesions. IMPRESSION: 1. No evidence of bowel obstruction or inflammation. Diverticulosis of the colon without evidence of diverticulitis. 2. Small bilateral inguinal hernias containing bowel but without proximal obstruction. 3. Subcentimeter low-attenuation lesions in the liver are too small to characterize but likely represent cysts or hemangiomas. 4. Prostate gland is enlarged. 5. Aortic atherosclerosis. Electronically Signed   By: Lucienne Capers M.D.   On: 12/06/2017 01:40    Anti-infectives: Anti-infectives (From admission, onward)   None      Assessment/Plan:  No hemogram is available this morning yet.  He appears quite stable from a vital signs standpoint and is comfortable.  No surgical needs at this time.  Will sign off.  This was discussed with nursing.  Florene Glen, MD, FACS  12/07/2017

## 2017-12-08 ENCOUNTER — Encounter: Payer: Self-pay | Admitting: Gastroenterology

## 2017-12-08 MED ORDER — SIMETHICONE 80 MG PO CHEW: 80.0000 mg | CHEWABLE_TABLET | Freq: Four times a day (QID) | ORAL | 0 refills | Status: DC | PRN

## 2017-12-08 MED ORDER — ASPIRIN EC 81 MG PO TBEC: 81.0000 mg | DELAYED_RELEASE_TABLET | Freq: Every day | ORAL | 0 refills | Status: DC

## 2017-12-08 MED ORDER — FERROUS SULFATE 325 (65 FE) MG PO TABS: 325.0000 mg | ORAL_TABLET | Freq: Two times a day (BID) | ORAL | 3 refills | Status: DC

## 2017-12-08 NOTE — Care Management Important Message (Signed)
Important Message  Patient Details  Name: Michael Gilbert MRN: [id number] Date of Birth: 1928-07-26   Medicare Important Message Given:  Yes    Shelbie Ammons, RN 12/08/2017, 8:59 AM

## 2017-12-08 NOTE — Discharge Instructions (Signed)
D/c with foley catheter, need to see urologist in office in 1 week,.

## 2017-12-08 NOTE — Progress Notes (Signed)
Discharge instructions along with home medications and follow up gone over with patient and wife. Foley catheter intact and patient and family educated on care. Both verbalize that they understood instructions. No prescriptions given to patient. IV's and tele removed. Pt being discharged home on room air, no distress noted. Ammie Dalton, RN

## 2017-12-09 ENCOUNTER — Telehealth: Payer: Self-pay | Admitting: Radiology

## 2017-12-09 LAB — BPAM RBC
BLOOD PRODUCT EXPIRATION DATE: 201902282359
Blood Product Expiration Date: 201902282359
ISSUE DATE / TIME: 201902011740
UNIT TYPE AND RH: 5100
Unit Type and Rh: 5100

## 2017-12-09 LAB — TYPE AND SCREEN
ABO/RH(D): O POS
Antibody Screen: NEGATIVE
Unit division: 0
Unit division: 0

## 2017-12-09 LAB — SURGICAL PATHOLOGY

## 2017-12-09 NOTE — Telephone Encounter (Signed)
-----   Message from Abbie Sons, MD sent at 12/09/2017  9:07 AM EST ----- I was contacted by a patient's family member.  He wanted to switch his appointment over to me.  He is scheduled to see Dr. Matilde Sprang next Monday.  Please see if I have an open appointment that morning.

## 2017-12-09 NOTE — Telephone Encounter (Signed)
done

## 2017-12-15 ENCOUNTER — Ambulatory Visit (INDEPENDENT_AMBULATORY_CARE_PROVIDER_SITE_OTHER): Payer: Medicare Other | Admitting: Urology

## 2017-12-15 ENCOUNTER — Encounter: Payer: Self-pay | Admitting: Urology

## 2017-12-15 ENCOUNTER — Ambulatory Visit: Payer: Medicare Other

## 2017-12-15 VITALS — BP 149/61 | HR 78 | Ht 72.0 in | Wt 188.4 lb

## 2017-12-15 DIAGNOSIS — R338 Other retention of urine: Secondary | ICD-10-CM

## 2017-12-15 DIAGNOSIS — N401 Enlarged prostate with lower urinary tract symptoms: Secondary | ICD-10-CM

## 2017-12-15 LAB — BLADDER SCAN AMB NON-IMAGING

## 2017-12-15 NOTE — Progress Notes (Signed)
12/15/2017 9:28 AM   Michael Gilbert [birth year] [id number]  Referring provider: Derinda Late, MD [phone] [address] [place], [place] [address]  Chief Complaint  Patient presents with  . Follow-up    HPI: 84 year old male previously followed for BPH with lower urinary tract symptoms.  He had been on tamsulosin and oxybutynin and was doing well without any problems.  He was hospitalized last week with a significant lower GI bleed requiring ICU admission.  He had a colonoscopy and the site of bleeding was identified and no intervention was required as the bleeding had ceased.  He subsequently developed urinary retention and was being in/out catheterized on 3-4 occasions.  A Foley catheter was subsequently placed and he was discharged with an indwelling Foley. He was off his tamsulosin during this hospitalization.   PMH: Past Medical History:  Diagnosis Date  . Allergy   . Bradycardia   . Colorectal polyps   . Diverticulosis   . Hypertension     Surgical History: Past Surgical History:  Procedure Laterality Date  . CATARACT EXTRACTION    . COLONOSCOPY WITH PROPOFOL N/A 12/06/2017   Procedure: COLONOSCOPY WITH PROPOFOL;  Surgeon: Lin Landsman, MD;  Location: [facility] ENDOSCOPY;  Service: Gastroenterology;  Laterality: N/A;  . TONSILECTOMY, ADENOIDECTOMY, BILATERAL MYRINGOTOMY AND TUBES      Home Medications:  Allergies as of 12/15/2017      Reactions   Ace Inhibitors Cough   Lovastatin    Other reaction(s): Muscle Pain   Simvastatin    Other reaction(s): Muscle Pain      Medication List        Accurate as of 12/15/17  9:28 AM. Always use your most recent med list.          Acetaminophen 500 MG coapsule Take 5,002 mg by mouth every 6 (six) hours as needed.   aspirin EC 81 MG tablet Take 1 tablet (81 mg total) by mouth daily.   ferrous sulfate 325 (65 FE) MG tablet Take 1 tablet (325 mg total) by mouth 2 (two) times  daily with a meal.   fluticasone 50 MCG/ACT nasal spray Commonly known as:  FLONASE Place 1 spray into both nostrils daily as needed.   loratadine 10 MG tablet Commonly known as:  CLARITIN Take 10 mg by mouth daily as needed.   losartan 25 MG tablet Commonly known as:  COZAAR Take 25 mg by mouth daily.   MULTI-VITAMINS Tabs Take 1 tablet by mouth daily.   omeprazole 40 MG capsule Commonly known as:  PRILOSEC Take 40 mg by mouth daily.   oxybutynin 10 MG 24 hr tablet Commonly known as:  DITROPAN-XL Take 10 mg by mouth at bedtime.   simethicone 80 MG chewable tablet Commonly known as:  MYLICON Chew 1 tablet (80 mg total) by mouth 4 (four) times daily as needed for flatulence.   tamsulosin 0.4 MG Caps capsule Commonly known as:  FLOMAX Take 0.4 mg by mouth daily.       Allergies:  Allergies  Allergen Reactions  . Ace Inhibitors Cough  . Lovastatin     Other reaction(s): Muscle Pain  . Simvastatin     Other reaction(s): Muscle Pain    Family History: Family History  Problem Relation Age of Onset  . Diabetes Father   . Diabetes Brother   . Bladder Cancer Neg Hx   . Kidney cancer Neg Hx   . Prostate cancer Neg Hx  Social History:  reports that he has quit smoking. he has never used smokeless tobacco. He reports that he drinks alcohol. He reports that he does not use drugs.  ROS: UROLOGY Frequent Urination?: No Hard to postpone urination?: No Burning/pain with urination?: No Get up at night to urinate?: No Leakage of urine?: No Urine stream starts and stops?: No Trouble starting stream?: No Do you have to strain to urinate?: No Blood in urine?: No Urinary tract infection?: No Sexually transmitted disease?: No Injury to kidneys or bladder?: No Painful intercourse?: No Weak stream?: No Erection problems?: No Penile pain?: No  Gastrointestinal Nausea?: No Vomiting?: No Indigestion/heartburn?: No Diarrhea?: No Constipation?:  No  Constitutional Fever: No Night sweats?: No Weight loss?: No Fatigue?: No  Skin Skin rash/lesions?: No Itching?: No  Eyes Blurred vision?: No Double vision?: No  Ears/Nose/Throat Sore throat?: No Sinus problems?: No  Hematologic/Lymphatic Swollen glands?: No Easy bruising?: No  Cardiovascular Leg swelling?: No Chest pain?: No  Respiratory Cough?: No Shortness of breath?: No  Endocrine Excessive thirst?: No  Musculoskeletal Back pain?: No Joint pain?: No  Neurological Headaches?: No Dizziness?: No  Psychologic Depression?: No Anxiety?: No  Physical Exam: BP (!) 149/61 (BP Location: Right Arm, Patient Position: Sitting, Cuff Size: Normal)   Pulse 78   Ht 6' (1.829 m)   Wt 188 lb 6.4 oz (85.5 kg)   BMI 25.55 kg/m   Constitutional:  Alert and oriented, No acute distress. HEENT: [place] AT, moist mucus membranes.  Trachea midline, no masses. Cardiovascular: No clubbing, cyanosis, or edema. Respiratory: Normal respiratory effort, no increased work of breathing. GI: Abdomen is soft, nontender, nondistended, no abdominal masses GU: No CVA tenderness.  Foley catheter draining clear urine Skin: No rashes, bruises or suspicious lesions. Lymph: No cervical or inguinal adenopathy. Neurologic: Grossly intact, no focal deficits, moving all 4 extremities. Psychiatric: Normal mood and affect.  Laboratory Data: Lab Results  Component Value Date   WBC 8.3 12/07/2017   HGB 8.4 (L) 12/07/2017   HCT 24.7 (L) 12/07/2017   MCV 88.2 12/07/2017   PLT 191 12/07/2017    Lab Results  Component Value Date   CREATININE 1.36 (H) 12/07/2017    Urinalysis Lab Results  Component Value Date   SPECGRAV 1.010 10/06/2017   PHUR 5.5 10/06/2017   COLORU Yellow 10/06/2017   APPEARANCEUR Clear 10/06/2017   LEUKOCYTESUR Negative 10/06/2017   PROTEINUR Negative 10/06/2017   GLUCOSEU Negative 10/06/2017   KETONESU Negative 10/06/2017   RBCU Negative 10/06/2017    BILIRUBINUR Negative 10/06/2017   UUROB 0.2 10/06/2017   NITRITE Negative 10/06/2017    Assessment & Plan:   84 year old male with  BPH and acute urinary retention.  His catheter was removed for a voiding trial.  Refer to the nursing note.  He voided 210 mL after 400 mL was instilled.  He returned later in the afternoon for a repeat bladder scan which was 217 mL.  He was not uncomfortable and felt he was voiding at baseline.  He was instructed to call for symptomatic retention and will otherwise keep his scheduled follow-up.  Follow-up 1 month for symptom reassessment and bladder scan for PVR.  1. Benign prostatic hyperplasia with urinary retention   Return in about 4 weeks (around 01/12/2018) for Recheck, Bladder scan.  Abbie Sons, [place] [address], [place], [place] [address] [phone]

## 2017-12-15 NOTE — Progress Notes (Signed)
Fill and Pull Catheter Removal  Patient is present today for a catheter removal.  Patient was cleaned and prepped in a sterile fashion 481ml of sterile water/ saline was instilled into the bladder when the patient felt the urge to urinate. 18ml of water was then drained from the balloon.  A 14FR foley cath was removed from the bladder no complications were noted .  Patient as then given some time to void on their own.  Patient can void  211ml on their own after some time.  Patient tolerated well.  Preformed by: Fonnie Jarvis, CMA  Follow up/ Additional notes: Patient will return to the office this afternoon  For a PVR  Bladder Scan Patient can void: 217 ml Performed By: Fonnie Jarvis, CMA

## 2017-12-15 NOTE — Discharge Summary (Signed)
[facility] [place] NAME: Michael Gilbert    MR#:  [id number]  DATE OF BIRTH:  July 09, 1928  DATE OF ADMISSION:  12/05/2017 ADMITTING PHYSICIAN: Vaughan Basta, MD  DATE OF DISCHARGE: 12/08/2017 11:23 AM  PRIMARY CARE PHYSICIAN: Derinda Late, MD    ADMISSION DIAGNOSIS:  Gastrointestinal hemorrhage, unspecified gastrointestinal hemorrhage type [K92.2]  DISCHARGE DIAGNOSIS:  Principal Problem:   GI bleed Active Problems:   Hemorrhage of rectum and anus   Diverticulosis of colon (without mention of hemorrhage)   Acute blood loss anemia   SECONDARY DIAGNOSIS:   Past Medical History:  Diagnosis Date  . Allergy   . Bradycardia   . Colorectal polyps   . Diverticulosis   . Hypertension     HOSPITAL COURSE:   *GI bleed lower GI due to diverticuli. No use of NSAIDs. Will hold aspirin. Protonix twice a day for now.   Transfused 2 units of platelets because of taking aspirin before admission, 1 unit of PRBC so far,   Hb stable. Need oral iron now.   Appreciated GI, vascular, surgical consult.    Colonoscopy showed diverticuli and 2 polyps which were biopsied and removed.   Tolerating diet, following GI clinic.  * Acute blood loss anemia   Transfused 1 unit of PRBC so far, he may require more if continued to bleed.   Stable now, iron supplementation now and on discharge.  *Hypotension This was secondary to acute GI bleed.  continue on IV fluids. Hold losartan.  *History of hypertension As mentioned ago hold losartan.  *BPH With urinary retention  Continue tamsulosin.   Started on oxybutynin.   His medications were held for last 2 days due to GI bleed, he has urinary retention and requiring in and out catheter today.   Encourage ambulation, we would like to monitor in hospital until he is able to have urination.   Still persist , so spoke to urologist, suggest to d/c with foley and follow in  Urology clinic in 1 week.  * Complain of frequent gas, bloating sensation, liquid stools in last few weeks to months.    I encouraged to try less dairy Products , and discussed this with his follow-up in GI clinic.    DISCHARGE CONDITIONS:   Stable.  CONSULTS OBTAINED:  Treatment Team:  Lucilla Lame, MD Schnier, Dolores Lory, MD Clayburn Pert, MD  DRUG ALLERGIES:   Allergies  Allergen Reactions  . Ace Inhibitors Cough  . Lovastatin     Other reaction(s): Muscle Pain  . Simvastatin     Other reaction(s): Muscle Pain    DISCHARGE MEDICATIONS:   Allergies as of 12/08/2017      Reactions   Ace Inhibitors Cough   Lovastatin    Other reaction(s): Muscle Pain   Simvastatin    Other reaction(s): Muscle Pain      Medication List    STOP taking these medications   mirabegron ER 50 MG Tb24 tablet Commonly known as:  MYRBETRIQ   oxybutynin 10 MG 24 hr tablet Commonly known as:  DITROPAN-XL     TAKE these medications   Acetaminophen 500 MG coapsule Take 5,002 mg by mouth every 6 (six) hours as needed.   aspirin EC 81 MG tablet Take 1 tablet (81 mg total) by mouth daily. What changed:  how much to take   ferrous sulfate 325 (65 FE) MG tablet Take 1 tablet (325 mg total) by mouth 2 (two) times daily with a  meal.   fluticasone 50 MCG/ACT nasal spray Commonly known as:  FLONASE Place 1 spray into both nostrils daily as needed.   loratadine 10 MG tablet Commonly known as:  CLARITIN Take 10 mg by mouth daily as needed.   losartan 25 MG tablet Commonly known as:  COZAAR Take 25 mg by mouth daily.   MULTI-VITAMINS Tabs Take 1 tablet by mouth daily.   omeprazole 40 MG capsule Commonly known as:  PRILOSEC Take 40 mg by mouth daily.   simethicone 80 MG chewable tablet Commonly known as:  MYLICON Chew 1 tablet (80 mg total) by mouth 4 (four) times daily as needed for flatulence.   tamsulosin 0.4 MG Caps capsule Commonly known as:  FLOMAX Take 0.4 mg by mouth  daily.   tolterodine 4 MG 24 hr capsule Commonly known as:  DETROL LA Take 1 capsule (4 mg total) by mouth daily.        DISCHARGE INSTRUCTIONS:    Follow with Urology in 1 week, GI in 2-3 weeks.  If you experience worsening of your admission symptoms, develop shortness of breath, life threatening emergency, suicidal or homicidal thoughts you must seek medical attention immediately by calling 911 or calling your MD immediately  if symptoms less severe.  You Must read complete instructions/literature along with all the possible adverse reactions/side effects for all the Medicines you take and that have been prescribed to you. Take any new Medicines after you have completely understood and accept all the possible adverse reactions/side effects.   Please note  You were cared for by a hospitalist during your hospital stay. If you have any questions about your discharge medications or the care you received while you were in the hospital after you are discharged, you can call the unit and asked to speak with the hospitalist on call if the hospitalist that took care of you is not available. Once you are discharged, your primary care physician will handle any further medical issues. Please note that NO REFILLS for any discharge medications will be authorized once you are discharged, as it is imperative that you return to your primary care physician (or establish a relationship with a primary care physician if you do not have one) for your aftercare needs so that they can reassess your need for medications and monitor your lab values.    Today   CHIEF COMPLAINT:   Chief Complaint  Patient presents with  . GI Bleeding    HISTORY OF PRESENT ILLNESS:  Michael Gilbert  is a 84 y.o. male with a known history of bradycardia, hypertension, takes aspirin every day at home. Has some history of IBS. Today morning he had 4 bowel movement in short period of time which were consisting of dark red blood, no  associated vomiting or abdominal pain. Yesterday's bowel movement was normal. In the emergency room he is noted to have very low blood pressure on arrival, his hemoglobin is still normal, but due to low blood pressure ER physician started on IV fluid bolus which helped to stabilize the blood pressure and he has also ordered a stat blood transfusion. He was using 3 tablets of Tylenol daily until 2 weeks ago because of some joint pains. Denies using NSAIDs for BC powders. Last 2 weeks he did not use any Tylenol. His last colonoscopy was at age of 9 which was normal and after that his physician said he don't need 1.   VITAL SIGNS:  Blood pressure (!) 150/62, pulse 75, temperature 97.8  F (36.6 C), temperature source Oral, resp. rate 18, height 6' (1.829 m), weight 86.2 kg (190 lb), SpO2 95 %.  I/O:  No intake or output data in the 24 hours ending 12/15/17 0741  PHYSICAL EXAMINATION:  GENERAL:  84 y.o.-year-old patient lying in the bed with no acute distress.  EYES: Pupils equal, round, reactive to light and accommodation. No scleral icterus. Extraocular muscles intact.  HEENT: Head atraumatic, normocephalic. Oropharynx and nasopharynx clear.  NECK:  Supple, no jugular venous distention. No thyroid enlargement, no tenderness.  LUNGS: Normal breath sounds bilaterally, no wheezing, rales,rhonchi or crepitation. No use of accessory muscles of respiration.  CARDIOVASCULAR: S1, S2 normal. No murmurs, rubs, or gallops.  ABDOMEN: Soft, non-tender, non-distended. Bowel sounds present. No organomegaly or mass.  EXTREMITIES: No pedal edema, cyanosis, or clubbing.  NEUROLOGIC: Cranial nerves II through XII are intact. Muscle strength 5/5 in all extremities. Sensation intact. Gait not checked.  PSYCHIATRIC: The patient is alert and oriented x 3.  SKIN: No obvious rash, lesion, or ulcer.   DATA REVIEW:   CBC No results for input(s): WBC, HGB, HCT, PLT in the last 168 hours.  Chemistries  No results  for input(s): NA, K, CL, CO2, GLUCOSE, BUN, CREATININE, CALCIUM, MG, AST, ALT, ALKPHOS, BILITOT in the last 168 hours.  Invalid input(s): GFRCGP  Cardiac Enzymes No results for input(s): TROPONINI in the last 168 hours.  Microbiology Results  Results for orders placed or performed during the hospital encounter of 12/05/17  MRSA PCR Screening     Status: None   Collection Time: 12/05/17  4:27 PM  Result Value Ref Range Status   MRSA by PCR NEGATIVE NEGATIVE Final    Comment:        The GeneXpert MRSA Assay (FDA approved for NASAL specimens only), is one component of a comprehensive MRSA colonization surveillance program. It is not intended to diagnose MRSA infection nor to guide or monitor treatment for MRSA infections. Performed at [facility], [address] [place], [place] [address]     RADIOLOGY:  No results found.  EKG:   Orders placed or performed during the hospital encounter of 12/05/17  . EKG 12-Lead  . EKG 12-Lead      Management plans discussed with the patient, family and they are in agreement.  CODE STATUS:  Code Status History    Date Active Date Inactive Code Status Order ID Comments User Context   12/05/2017 13:11 12/08/2017 15:13 Full Code [id number]  Vaughan Basta, MD Inpatient    Advance Directive Documentation     Most Recent Value  Type of Advance Directive  Healthcare Power of Attorney, Living will  Pre-existing out of facility DNR order (yellow form or pink MOST form)  No data  "MOST" Form in Place?  No data      TOTAL TIME TAKING CARE OF THIS PATIENT: 35 minutes.    Vaughan Basta M.D on 12/15/2017 at 7:41 AM  Between 7am to 6pm - Pager - [phone]  After 6pm go to [email or web address] - password [email or web address] [facility]  Office  [phone]  CC: Primary care physician; Derinda Late, MD   Note: This dictation was prepared with Dragon dictation along with smaller phrase technology. Any  transcriptional errors that result from this process are unintentional.

## 2017-12-16 ENCOUNTER — Encounter: Payer: Self-pay | Admitting: Urology

## 2017-12-16 ENCOUNTER — Ambulatory Visit (INDEPENDENT_AMBULATORY_CARE_PROVIDER_SITE_OTHER): Payer: Medicare Other | Admitting: Gastroenterology

## 2017-12-16 ENCOUNTER — Other Ambulatory Visit
Admission: RE | Admit: 2017-12-16 | Discharge: 2017-12-16 | Disposition: A | Discharge status: Home / Self Care | Payer: Medicare Other | Source: Ambulatory Visit | Attending: Gastroenterology | Admitting: Gastroenterology

## 2017-12-16 ENCOUNTER — Encounter: Payer: Self-pay | Admitting: Gastroenterology

## 2017-12-16 VITALS — BP 119/50 | HR 58 | Temp 97.5°F | Ht 72.0 in | Wt 188.6 lb

## 2017-12-16 DIAGNOSIS — K580 Irritable bowel syndrome with diarrhea: Secondary | ICD-10-CM

## 2017-12-16 DIAGNOSIS — D500 Iron deficiency anemia secondary to blood loss (chronic): Secondary | ICD-10-CM

## 2017-12-16 LAB — CBC
HEMATOCRIT: 25.8 % — AB (ref 40.0–52.0)
Hemoglobin: 8.5 g/dL — ABNORMAL LOW (ref 13.0–18.0)
MCH: 29.6 pg (ref 26.0–34.0)
MCHC: 33 g/dL (ref 32.0–36.0)
MCV: 89.6 fL (ref 80.0–100.0)
Platelets: 262 10*3/uL (ref 150–440)
RBC: 2.88 MIL/uL — ABNORMAL LOW (ref 4.40–5.90)
RDW: 16.7 % — AB (ref 11.5–14.5)
WBC: 5.8 10*3/uL (ref 3.8–10.6)

## 2017-12-16 LAB — FERRITIN: Ferritin: 29 ng/mL (ref 24–336)

## 2017-12-16 MED ORDER — RIFAXIMIN 550 MG PO TABS: 550.0000 mg | ORAL_TABLET | Freq: Three times a day (TID) | ORAL | 0 refills | Status: AC

## 2017-12-16 NOTE — Progress Notes (Signed)
[name], MD [address]  [place]  [place], [place] [address]  Main: [phone]  Fax: [phone]    Gastroenterology Consultation  Referring Provider:     Derinda Late, MD Primary Care Physician:  Derinda Late, MD Primary Gastroenterologist:  Dr. [name] Reason for Consultation:     Hospital follow-up, rectal bleeding        HPI:   Michael Gilbert is a 84 y.o. male referred by Dr. Derinda Late, MD  for consultation & management of rectal bleeding. He is here at [facility] follow-up. Hospital summary: he was recently admitted to [facility] on 12/05/2017 secondary to severe symptomatic anemia resulting in hemorrhagic shock, from hematochezia.he was taking aspirin 81 mg twice a day. His hemoglobin dropped to 7.5 and underwent blood transfusion. He initially underwent bleeding scan which was suspicious for bleeding in sigmoid colon versus small bowel. Subsequently, underwent colonoscopy which revealed severe sigmoid diverticulosis and large nonbleeding internal hemorrhoids. There was no evidence of active bleeding. His hemoglobin on discharge was 8.4  Follow-up visit 12/16/2017: He denies any further episodes of bleeding per rectum since discharge. He is taking aspirin 81 mgdaily, oral iron 325 mg twice daily and Prilosec 40 mg every other day.he also reports that he has been experiencing several years history of intermittent episodes of severe gurgling/rumbling, gas associated with explosive, nonbloody loose stools or sometimes mucus which last about a week. These episodes occur every 2 months or so. He has normal bowel movements and does not have any other symptoms in between these episodes. He has been taking align once a day. He did not find any correlation to the foods. He denies being lactose intolerant. He also reports long-term history of reflux which is fairly under control currently on Prilosec every other day. He denies having an  upper endoscopy in the past.   NSAIDs: none  Antiplts/Anticoagulants/Anti thrombotics: aspirin 81 mg  GI Procedures:  Colonoscopy 12/06/17 Findings: - Two 4 to 5 mm polyps in the transverse colon, removed with a cold snare. Resected and retrieved. - Diverticulosis in the sigmoid colon. - No evidence of active bleeding - He probably had divertucular bleed in sigmoid colon based on bleeding scan secondary to chronic use of aspirin 81mg  BID - The examined portion of the ileum was normal. - Non-bleeding internal hemorrhoids. - The examination was otherwise normal.  DIAGNOSIS:  A. COLON POLYP, TRANSVERSE; COLD SNARE:  - TUBULAR ADENOMA, 2 FRAGMENTS.  - NEGATIVE FOR HIGH-GRADE DYSPLASIA AND MALIGNANCY.  Past Medical History:  Diagnosis Date  . Allergy   . Bradycardia   . Colorectal polyps   . Diverticulosis   . Hypertension     Past Surgical History:  Procedure Laterality Date  . CATARACT EXTRACTION    . COLONOSCOPY WITH PROPOFOL N/A 12/06/2017   Procedure: COLONOSCOPY WITH PROPOFOL;  Surgeon: Lin Landsman, MD;  Location[facility] ENDOSCOPY;  Service: Gastroenterology;  Laterality: N/A;  . TONSILECTOMY, ADENOIDECTOMY, BILATERAL MYRINGOTOMY AND TUBES       Current Outpatient Medications:  .  Acetaminophen 500 MG coapsule, Take 5,002 mg by mouth every 6 (six) hours as needed. , Disp: , Rfl:  .  aspirin EC 81 MG tablet, Take 1 tablet (81 mg total) by mouth daily., Disp: 30 tablet, Rfl: 0 .  ferrous sulfate 325 (65 FE) MG tablet, Take 1 tablet (325 mg total) by mouth 2 (two) times daily with a meal., Disp: 60 tablet, Rfl: 3 .  fluticasone (FLONASE)  50 MCG/ACT nasal spray, Place 1 spray into both nostrils daily as needed. , Disp: , Rfl: 10 .  loratadine (CLARITIN) 10 MG tablet, Take 10 mg by mouth daily as needed. , Disp: , Rfl:  .  losartan (COZAAR) 25 MG tablet, Take 25 mg by mouth daily. , Disp: , Rfl:  .  Multiple Vitamin (MULTI-VITAMINS) TABS, Take 1 tablet by mouth daily. ,  Disp: , Rfl:  .  omeprazole (PRILOSEC) 40 MG capsule, Take 40 mg by mouth daily. , Disp: , Rfl: 10 .  oxybutynin (DITROPAN-XL) 10 MG 24 hr tablet, Take 10 mg by mouth at bedtime., Disp: , Rfl:  .  simethicone (MYLICON) 80 MG chewable tablet, Chew 1 tablet (80 mg total) by mouth 4 (four) times daily as needed for flatulence., Disp: 30 tablet, Rfl: 0 .  tamsulosin (FLOMAX) 0.4 MG CAPS capsule, Take 0.4 mg by mouth daily. , Disp: , Rfl: 2 .  rifaximin (XIFAXAN) 550 MG TABS tablet, Take 1 tablet (550 mg total) by mouth 3 (three) times daily for 14 days., Disp: 42 tablet, Rfl: 0   Family History  Problem Relation Age of Onset  . Diabetes Father   . Diabetes Brother   . Bladder Cancer Neg Hx   . Kidney cancer Neg Hx   . Prostate cancer Neg Hx      Social History   Tobacco Use  . Smoking status: Former Research scientist (life sciences)  . Smokeless tobacco: Never Used  Substance Use Topics  . Alcohol use: Yes  . Drug use: No    Allergies as of 12/16/2017 - Review Complete 12/16/2017  Allergen Reaction Noted  . Ace inhibitors Cough 06/03/2014  . Lovastatin  06/03/2014  . Simvastatin  06/03/2014    Review of Systems:    All systems reviewed and negative except where noted in HPI.   Physical Exam:  BP (!) 119/50   Pulse (!) 58   Temp (!) 97.5 F (36.4 C) (Oral)   Ht 6' (1.829 m)   Wt 188 lb 9.6 oz (85.5 kg)   BMI 25.58 kg/m  No LMP for male patient.  General:   Alert,  Well-developed, well-nourished, pleasant and cooperative in NAD Head:  Normocephalic and atraumatic. Eyes:  Sclera clear, no icterus.   Conjunctiva pink. Ears:  Normal auditory acuity. Nose:  No deformity, discharge, or lesions. Mouth:  No deformity or lesions,oropharynx pink & moist. Neck:  Supple; no masses or thyromegaly. Lungs:  Respirations even and unlabored.  Clear throughout to auscultation.   No wheezes, crackles, or rhonchi. No acute distress. Heart:  Regular rate and rhythm; no murmurs, clicks, rubs, or gallops. Abdomen:   Normal bowel sounds. Soft, non-tender and non-distended without masses, hepatosplenomegaly or hernias noted.  No guarding or rebound tenderness.   Rectal: Not performed Msk:  Symmetrical without gross deformities. Good, equal movement & strength bilaterally. Pulses:  Normal pulses noted. Extremities:  No clubbing or edema.  No cyanosis. Neurologic:  Alert and oriented x3;  grossly normal neurologically. Skin:  Intact without significant lesions or rashes. No jaundice. Lymph Nodes:  No significant cervical adenopathy. Psych:  Alert and cooperative. Normal mood and affect.  Imaging Studies: reviewed  Assessment and Plan:   Michael Gilbert is a 84 y.o. male with recent history of hemodynamically significant lower GI bleed, most likely diverticular in origin. He also has several years history of intermittent episodes of gas, nonbloody diarrhea which are self-limiting without any alarm signs or symptoms  Normocytic anemia: Secondary to  lower GI bleed, probably diverticular - Check CBC, ferritin - Continue oral iron for now - Okay to continue aspirin 81 mg daily - If he has recurrence of GI bleed, recommend VCE  IBS-diarrhea:currently asymptomatic - Trial of VSL#3 - given him a prescription for rifaximin to take when his symptoms flare up - Check H. Pylori IgG  GERD: symptoms fairly under control - Recommend to switch from omeprazole on alternate days to H2 blocker as needed   Follow up in 3 months   [name], MD

## 2017-12-17 ENCOUNTER — Telehealth: Payer: Self-pay | Admitting: *Deleted

## 2017-12-17 LAB — H. PYLORI ANTIBODY, IGG: H PYLORI IGG: 1 {index_val} — AB (ref 0.00–0.79)

## 2017-12-17 NOTE — Telephone Encounter (Signed)
Patient went to pick up prescription Rifaximin and it was going to cost him to much, he wants something else that is much cheaper.

## 2017-12-19 ENCOUNTER — Other Ambulatory Visit: Payer: Self-pay

## 2017-12-19 ENCOUNTER — Telehealth: Payer: Self-pay | Admitting: Gastroenterology

## 2017-12-19 DIAGNOSIS — B9681 Helicobacter pylori [H. pylori] as the cause of diseases classified elsewhere: Secondary | ICD-10-CM

## 2017-12-19 DIAGNOSIS — K2970 Gastritis, unspecified, without bleeding: Principal | ICD-10-CM

## 2017-12-19 NOTE — Telephone Encounter (Signed)
Spoke with patient, scheduled EGD and verified understanding of lab results//kdc

## 2017-12-19 NOTE — Telephone Encounter (Signed)
Patient LVM to please call him.

## 2017-12-26 ENCOUNTER — Other Ambulatory Visit: Payer: Self-pay

## 2017-12-26 MED ORDER — POLYETHYLENE GLYCOL 3350 17 G PO PACK: 17.0000 g | PACK | Freq: Two times a day (BID) | ORAL | 3 refills | Status: DC

## 2017-12-26 NOTE — Telephone Encounter (Signed)
Advised spouse Rx was at the pharmacy.  Miralax BID

## 2018-01-01 ENCOUNTER — Ambulatory Visit
Admission: RE | Admit: 2018-01-01 | Discharge: 2018-01-01 | Disposition: A | Discharge status: Home / Self Care | Payer: Medicare Other | Source: Ambulatory Visit | Attending: Gastroenterology | Admitting: Gastroenterology

## 2018-01-01 ENCOUNTER — Ambulatory Visit: Payer: Medicare Other | Admitting: Anesthesiology

## 2018-01-01 ENCOUNTER — Encounter
Admission: RE | Disposition: A | Discharge status: Home / Self Care | Payer: Self-pay | Source: Ambulatory Visit | Attending: Gastroenterology

## 2018-01-01 ENCOUNTER — Encounter: Payer: Self-pay | Admitting: *Deleted

## 2018-01-01 DIAGNOSIS — B9681 Helicobacter pylori [H. pylori] as the cause of diseases classified elsewhere: Secondary | ICD-10-CM | POA: Diagnosis present

## 2018-01-01 DIAGNOSIS — R768 Other specified abnormal immunological findings in serum: Secondary | ICD-10-CM

## 2018-01-01 DIAGNOSIS — Z7982 Long term (current) use of aspirin: Secondary | ICD-10-CM | POA: Insufficient documentation

## 2018-01-01 DIAGNOSIS — Z79899 Other long term (current) drug therapy: Secondary | ICD-10-CM | POA: Diagnosis not present

## 2018-01-01 DIAGNOSIS — Z888 Allergy status to other drugs, medicaments and biological substances status: Secondary | ICD-10-CM | POA: Insufficient documentation

## 2018-01-01 DIAGNOSIS — R001 Bradycardia, unspecified: Secondary | ICD-10-CM | POA: Insufficient documentation

## 2018-01-01 DIAGNOSIS — K2950 Unspecified chronic gastritis without bleeding: Secondary | ICD-10-CM | POA: Diagnosis not present

## 2018-01-01 DIAGNOSIS — K449 Diaphragmatic hernia without obstruction or gangrene: Secondary | ICD-10-CM | POA: Diagnosis not present

## 2018-01-01 DIAGNOSIS — K219 Gastro-esophageal reflux disease without esophagitis: Secondary | ICD-10-CM | POA: Diagnosis not present

## 2018-01-01 DIAGNOSIS — K2970 Gastritis, unspecified, without bleeding: Secondary | ICD-10-CM

## 2018-01-01 DIAGNOSIS — I10 Essential (primary) hypertension: Secondary | ICD-10-CM | POA: Insufficient documentation

## 2018-01-01 DIAGNOSIS — R7689 Other specified abnormal immunological findings in serum: Secondary | ICD-10-CM

## 2018-01-01 DIAGNOSIS — Z87891 Personal history of nicotine dependence: Secondary | ICD-10-CM | POA: Insufficient documentation

## 2018-01-01 HISTORY — PX: ESOPHAGOGASTRODUODENOSCOPY (EGD) WITH PROPOFOL: SHX5813

## 2018-01-01 HISTORY — DX: Gastro-esophageal reflux disease without esophagitis: K21.9

## 2018-01-01 SURGERY — ESOPHAGOGASTRODUODENOSCOPY (EGD) WITH PROPOFOL
Anesthesia: General

## 2018-01-01 MED ORDER — LIDOCAINE HCL (CARDIAC) 20 MG/ML IV SOLN
INTRAVENOUS | Status: DC | PRN
  Administered 2018-01-01: 80 mg via INTRAVENOUS

## 2018-01-01 MED ORDER — PROPOFOL 10 MG/ML IV BOLUS
INTRAVENOUS | Status: AC
  Filled 2018-01-01: qty 20

## 2018-01-01 MED ORDER — SODIUM CHLORIDE 0.9 % IV SOLN
INTRAVENOUS | Status: DC
  Administered 2018-01-01: 09:00:00 via INTRAVENOUS

## 2018-01-01 MED ORDER — PROPOFOL 10 MG/ML IV BOLUS
INTRAVENOUS | Status: DC | PRN
  Administered 2018-01-01: 50 mg via INTRAVENOUS
  Administered 2018-01-01: 30 mg via INTRAVENOUS
  Administered 2018-01-01: 20 mg via INTRAVENOUS

## 2018-01-01 MED ORDER — LIDOCAINE HCL (PF) 2 % IJ SOLN
INTRAMUSCULAR | Status: AC
  Filled 2018-01-01: qty 10

## 2018-01-01 NOTE — Op Note (Signed)
[facility] Gastroenterology Patient Name: Michael Gilbert Procedure Date: 01/01/2018 9:09 AM MRN: [id number] Account #: [id number] Date of Birth: 21-Mar-1928 Admit Type: Outpatient Age: 84 Room: [facility] ENDO ROOM 4 Gender: Male Note Status: Finalized Procedure:            Upper GI endoscopy Indications:          Positive H Pylori IgG Providers:            Lin Landsman MD, MD Referring MD:         Caprice Renshaw MD (Referring MD) Medicines:            Monitored Anesthesia Care Complications:        No immediate complications. Estimated blood loss:                        Minimal. Procedure:            Pre-Anesthesia Assessment:                       - Prior to the procedure, a History and Physical was                        performed, and patient medications and allergies were                        reviewed. The patient is competent. The risks and                        benefits of the procedure and the sedation options and                        risks were discussed with the patient. All questions                        were answered and informed consent was obtained.                        Patient identification and proposed procedure were                        verified by the physician, the nurse, the                        anesthesiologist, the anesthetist and the technician in                        the pre-procedure area in the procedure room. Mental                        Status Examination: alert and oriented. Airway                        Examination: normal oropharyngeal airway and neck                        mobility. Respiratory Examination: clear to                        auscultation. CV Examination: normal. Prophylactic  Antibiotics: The patient does not require prophylactic                        antibiotics. Prior Anticoagulants: The patient has                        taken aspirin, last dose was 1 day prior to procedure.                        ASA Grade Assessment: II - A patient with mild systemic                        disease. After reviewing the risks and benefits, the                        patient was deemed in satisfactory condition to undergo                        the procedure. The anesthesia plan was to use monitored                        anesthesia care (MAC). Immediately prior to                        administration of medications, the patient was                        re-assessed for adequacy to receive sedatives. The                        heart rate, respiratory rate, oxygen saturations, blood                        pressure, adequacy of pulmonary ventilation, and                        response to care were monitored throughout the                        procedure. The physical status of the patient was                        re-assessed after the procedure.                       After obtaining informed consent, the endoscope was                        passed under direct vision. Throughout the procedure,                        the patient's blood pressure, pulse, and oxygen                        saturations were monitored continuously. The Endoscope                        was introduced through the mouth, and advanced to the  second part of duodenum. The upper GI endoscopy was                        accomplished without difficulty. The patient tolerated                        the procedure well. Findings:      The duodenal bulb and second portion of the duodenum were normal.      The entire examined stomach was normal. Biopsies were taken with a cold       forceps for Helicobacter pylori testing.      A small hiatal hernia was present.      The gastroesophageal junction and examined esophagus were normal. Impression:           - Normal duodenal bulb and second portion of the                        duodenum.                       - Normal stomach. Biopsied.                        - Small hiatal hernia.                       - Normal gastroesophageal junction and esophagus. Recommendation:       - Await pathology results, treat H Pylori if positive.                       - Discharge patient to home.                       - Resume previous diet today.                       - Continue present medications. Procedure Code(s):    --- Professional ---                       [phone], Esophagogastroduodenoscopy, flexible, transoral;                        with biopsy, single or multiple Diagnosis Code(s):    --- Professional ---                       K44.9, Diaphragmatic hernia without obstruction or                        gangrene CPT copyright 2016 American Medical Association. All rights reserved. The codes documented in this report are preliminary and upon coder review may  be revised to meet current compliance requirements. Dr. Ulyess Mort Lin Landsman MD, MD 01/01/2018 9:25:34 AM This report has been signed electronically. Number of Addenda: 0 Note Initiated On: 01/01/2018 9:09 AM      [facility]

## 2018-01-01 NOTE — Anesthesia Post-op Follow-up Note (Signed)
Anesthesia QCDR form completed.        

## 2018-01-01 NOTE — Anesthesia Postprocedure Evaluation (Signed)
Anesthesia Post Note  Patient: Michael Gilbert  Procedure(s) Performed: ESOPHAGOGASTRODUODENOSCOPY (EGD) WITH PROPOFOL (N/A )  Patient location during evaluation: PACU Anesthesia Type: General Level of consciousness: awake and alert Pain management: pain level controlled Vital Signs Assessment: post-procedure vital signs reviewed and stable Respiratory status: spontaneous breathing, nonlabored ventilation, respiratory function stable and patient connected to nasal cannula oxygen Cardiovascular status: blood pressure returned to baseline and stable Postop Assessment: no apparent nausea or vomiting Anesthetic complications: no     Last Vitals:  Vitals:   01/01/18 0930 01/01/18 0940  BP: 115/69 122/80  Pulse: (!) 58 (!) 57  Resp: (!) 22 14  Temp:    SpO2: 100% 99%    Last Pain:  Vitals:   01/01/18 0920  TempSrc: Tympanic                 Molli Barrows

## 2018-01-01 NOTE — H&P (Signed)
[name], MD [address]  [place]  [place], [place] [address]  Main: [phone]  Fax: [phone] Pager: [phone]  Primary Care Physician:  Derinda Late, MD Primary Gastroenterologist:  Dr. [name]  Pre-Procedure History & Physical: HPI:  Michael Gilbert is a 84 y.o. male is here for an endoscopy.   Past Medical History:  Diagnosis Date  . Allergy   . Bradycardia   . Colorectal polyps   . Diverticulosis   . GERD (gastroesophageal reflux disease)   . Hypertension     Past Surgical History:  Procedure Laterality Date  . CATARACT EXTRACTION    . COLONOSCOPY WITH PROPOFOL N/A 12/06/2017   Procedure: COLONOSCOPY WITH PROPOFOL;  Surgeon: Lin Landsman, MD;  Location: [facility] ENDOSCOPY;  Service: Gastroenterology;  Laterality: N/A;  . TONSILECTOMY, ADENOIDECTOMY, BILATERAL MYRINGOTOMY AND TUBES      Prior to Admission medications   Medication Sig Start Date End Date Taking? Authorizing Provider  aspirin EC 81 MG tablet Take 1 tablet (81 mg total) by mouth daily. 12/08/17  Yes Vaughan Basta, MD  ferrous sulfate 325 (65 FE) MG tablet Take 1 tablet (325 mg total) by mouth 2 (two) times daily with a meal. 12/08/17  Yes Vaughan Basta, MD  fluticasone (FLONASE) 50 MCG/ACT nasal spray Place 1 spray into both nostrils daily as needed.  07/03/17  Yes [provider]  loratadine (CLARITIN) 10 MG tablet Take 10 mg by mouth daily as needed.  12/12/16  Yes [provider]  losartan (COZAAR) 25 MG tablet Take 25 mg by mouth daily.    Yes [provider]  Multiple Vitamin (MULTI-VITAMINS) TABS Take 1 tablet by mouth daily.    Yes [provider]  omeprazole (PRILOSEC) 40 MG capsule Take 40 mg by mouth daily.  08/02/17  Yes [provider]  oxybutynin (DITROPAN-XL) 10 MG 24 hr tablet Take 10 mg by mouth at bedtime.   Yes [provider]  tamsulosin (FLOMAX) 0.4 MG CAPS capsule Take 0.4 mg by mouth daily.   10/04/17  Yes [provider]  Acetaminophen 500 MG coapsule Take 5,002 mg by mouth every 6 (six) hours as needed.     [provider]  polyethylene glycol (MIRALAX) packet Take 17 g by mouth 2 (two) times daily. 12/26/17 04/25/18  Jonathon Bellows, MD  simethicone (MYLICON) 80 MG chewable tablet Chew 1 tablet (80 mg total) by mouth 4 (four) times daily as needed for flatulence. 12/08/17   Vaughan Basta, MD    Allergies as of 12/19/2017 - Review Complete 12/16/2017  Allergen Reaction Noted  . Ace inhibitors Cough 06/03/2014  . Lovastatin  06/03/2014  . Simvastatin  06/03/2014    Family History  Problem Relation Age of Onset  . Diabetes Father   . Diabetes Brother   . Bladder Cancer Neg Hx   . Kidney cancer Neg Hx   . Prostate cancer Neg Hx     Social History   Socioeconomic History  . Marital status: Married    Spouse name: Not on file  . Number of children: Not on file  . Years of education: Not on file  . Highest education level: Not on file  Social Needs  . Financial resource strain: Not on file  . Food insecurity - worry: Not on file  . Food insecurity - inability: Not on file  . Transportation needs - medical: Not on file  . Transportation needs - non-medical: Not on file  Occupational History  .  Not on file  Tobacco Use  . Smoking status: Former Research scientist (life sciences)  . Smokeless tobacco: Never Used  Substance and Sexual Activity  . Alcohol use: Yes    Alcohol/week: 0.6 oz    Types: 1 Glasses of wine per week    Comment: 1 glass of wine daily  . Drug use: No  . Sexual activity: Not on file  Other Topics Concern  . Not on file  Social History Narrative  . Not on file    Review of Systems: See HPI, otherwise negative ROS  Physical Exam: BP (!) 167/65   Pulse 64   Temp 98.3 F (36.8 C) (Tympanic)   Resp 16   Ht 6' (1.829 m)   Wt 188 lb (85.3 kg)   BMI 25.50 kg/m  General:   Alert,  pleasant and cooperative in NAD Head:  Normocephalic and  atraumatic. Neck:  Supple; no masses or thyromegaly. Lungs:  Clear throughout to auscultation.    Heart:  Regular rate and rhythm. Abdomen:  Soft, nontender and nondistended. Normal bowel sounds, without guarding, and without rebound.   Neurologic:  Alert and  oriented x4;  grossly normal neurologically.  Impression/Plan: Michael Gilbert is here for an endoscopy to be performed for H Pylori infection  Risks, benefits, limitations, and alternatives regarding  endoscopy have been reviewed with the patient.  Questions have been answered.  All parties agreeable.   Sherri Sear, MD  01/01/2018, 8:40 AM

## 2018-01-01 NOTE — Transfer of Care (Signed)
Immediate Anesthesia Transfer of Care Note  Patient: Michael Gilbert  Procedure(s) Performed: ESOPHAGOGASTRODUODENOSCOPY (EGD) WITH PROPOFOL (N/A )  Patient Location: PACU and Endoscopy Unit  Anesthesia Type:General  Level of Consciousness: awake  Airway & Oxygen Therapy: Patient Spontanous Breathing  Post-op Assessment: Report given to RN  Post vital signs: stable  Last Vitals:  Vitals:   01/01/18 0819  BP: (!) 167/65  Pulse: 64  Resp: 16  Temp: 36.8 C    Last Pain:  Vitals:   01/01/18 0819  TempSrc: Tympanic         Complications: No apparent anesthesia complications

## 2018-01-01 NOTE — Anesthesia Preprocedure Evaluation (Signed)
Anesthesia Evaluation  Patient identified by MRN, date of birth, ID band Patient awake    Reviewed: Allergy & Precautions, H&P , NPO status , Patient's Chart, lab work & pertinent test results, reviewed documented beta blocker date and time   Airway Mallampati: II   Neck ROM: full    Dental  (+) Poor Dentition   Pulmonary neg pulmonary ROS, sleep apnea , former smoker,    Pulmonary exam normal        Cardiovascular Exercise Tolerance: Poor hypertension, On Medications negative cardio ROS Normal cardiovascular exam Rhythm:regular Rate:Normal     Neuro/Psych negative neurological ROS  negative psych ROS   GI/Hepatic negative GI ROS, Neg liver ROS, GERD  Medicated,  Endo/Other  negative endocrine ROS  Renal/GU negative Renal ROS  negative genitourinary   Musculoskeletal   Abdominal   Peds  Hematology negative hematology ROS (+) anemia ,   Anesthesia Other Findings Past Medical History: No date: Allergy No date: Bradycardia No date: Colorectal polyps No date: Diverticulosis No date: GERD (gastroesophageal reflux disease) No date: Hypertension Past Surgical History: No date: CATARACT EXTRACTION 12/06/2017: COLONOSCOPY WITH PROPOFOL; N/A     Comment:  Procedure: COLONOSCOPY WITH PROPOFOL;  Surgeon: Lin Landsman, MD;  Location: [facility] ENDOSCOPY;  Service:               Gastroenterology;  Laterality: N/A; No date: TONSILECTOMY, ADENOIDECTOMY, BILATERAL MYRINGOTOMY AND TUBES BMI    Body Mass Index:  25.50 kg/m     Reproductive/Obstetrics negative OB ROS                             Anesthesia Physical Anesthesia Plan  ASA: II  Anesthesia Plan: General   Post-op Pain Management:    Induction:   PONV Risk Score and Plan:   Airway Management Planned:   Additional Equipment:   Intra-op Plan:   Post-operative Plan:   Informed Consent: I have reviewed the  patients History and Physical, chart, labs and discussed the procedure including the risks, benefits and alternatives for the proposed anesthesia with the patient or authorized representative who has indicated his/her understanding and acceptance.   Dental Advisory Given  Plan Discussed with: CRNA  Anesthesia Plan Comments:         Anesthesia Quick Evaluation

## 2018-01-02 ENCOUNTER — Encounter: Payer: Self-pay | Admitting: Gastroenterology

## 2018-01-02 LAB — SURGICAL PATHOLOGY

## 2018-01-05 ENCOUNTER — Encounter: Payer: Self-pay | Admitting: Gastroenterology

## 2018-01-12 ENCOUNTER — Ambulatory Visit: Payer: Medicare Other

## 2018-01-12 ENCOUNTER — Ambulatory Visit (INDEPENDENT_AMBULATORY_CARE_PROVIDER_SITE_OTHER): Payer: Medicare Other | Admitting: Urology

## 2018-01-12 DIAGNOSIS — N401 Enlarged prostate with lower urinary tract symptoms: Secondary | ICD-10-CM | POA: Diagnosis not present

## 2018-01-12 LAB — BLADDER SCAN AMB NON-IMAGING: SCAN RESULT: 255

## 2018-01-12 MED ORDER — TAMSULOSIN HCL 0.4 MG PO CAPS: 0.8000 mg | ORAL_CAPSULE | Freq: Every day | ORAL | 6 refills | Status: DC

## 2018-01-12 NOTE — Progress Notes (Signed)
01/12/2018 9:52 AM   Michael Gilbert April 14, 1928 [id number]  Referring provider: Derinda Late, MD [phone] [facility] [place], [place] [address]  Chief Complaint  Patient presents with  . Benign Prostatic Hypertrophy  . Follow-up    HPI: 84 year old male presents for follow-up of urinary retention.  Refer to my previous note dated 12/15/2017.  He feels he is voiding at baseline.  He has daytime urinary frequency with occasional urgency.  He denies urge incontinence.  He has nocturia x1.  PVR by bladder scan today was 255 mL.  He remains on tamsulosin.   PMH: Past Medical History:  Diagnosis Date  . Allergy   . Bradycardia   . Colorectal polyps   . Diverticulosis   . GERD (gastroesophageal reflux disease)   . Hypertension     Surgical History: Past Surgical History:  Procedure Laterality Date  . CATARACT EXTRACTION    . COLONOSCOPY WITH PROPOFOL N/A 12/06/2017   Procedure: COLONOSCOPY WITH PROPOFOL;  Surgeon: Lin Landsman, MD;  Location: [facility] ENDOSCOPY;  Service: Gastroenterology;  Laterality: N/A;  . ESOPHAGOGASTRODUODENOSCOPY (EGD) WITH PROPOFOL N/A 01/01/2018   Procedure: ESOPHAGOGASTRODUODENOSCOPY (EGD) WITH PROPOFOL;  Surgeon: Lin Landsman, MD;  Location: [facility] ENDOSCOPY;  Service: Gastroenterology;  Laterality: N/A;  . TONSILECTOMY, ADENOIDECTOMY, BILATERAL MYRINGOTOMY AND TUBES      Home Medications:  Allergies as of 01/12/2018      Reactions   Ace Inhibitors Cough   Lovastatin Other (See Comments)   Other reaction(s): Muscle Pain   Simvastatin    Other reaction(s): Muscle Pain      Medication List        Accurate as of 01/12/18  9:52 AM. Always use your most recent med list.          Acetaminophen 500 MG coapsule Take 5,002 mg by mouth every 6 (six) hours as needed.   aspirin EC 81 MG tablet Take 1 tablet (81 mg total) by mouth daily.   ferrous sulfate 325 (65 FE) MG tablet Take 1 tablet (325  mg total) by mouth 2 (two) times daily with a meal.   fluticasone 50 MCG/ACT nasal spray Commonly known as:  FLONASE Place 1 spray into both nostrils daily as needed.   loratadine 10 MG tablet Commonly known as:  CLARITIN Take 10 mg by mouth daily as needed.   losartan 25 MG tablet Commonly known as:  COZAAR Take 25 mg by mouth daily.   MULTI-VITAMINS Tabs Take 1 tablet by mouth daily.   omeprazole 40 MG capsule Commonly known as:  PRILOSEC Take 40 mg by mouth daily.   tamsulosin 0.4 MG Caps capsule Commonly known as:  FLOMAX Take 0.4 mg by mouth daily.       Allergies:  Allergies  Allergen Reactions  . Ace Inhibitors Cough  . Lovastatin Other (See Comments)    Other reaction(s): Muscle Pain  . Simvastatin     Other reaction(s): Muscle Pain    Family History: Family History  Problem Relation Age of Onset  . Diabetes Father   . Diabetes Brother   . Bladder Cancer Neg Hx   . Kidney cancer Neg Hx   . Prostate cancer Neg Hx     Social History:  reports that he has quit smoking. he has never used smokeless tobacco. He reports that he drinks about 0.6 oz of alcohol per week. He reports that he does not use drugs.  ROS: UROLOGY Frequent Urination?: Yes  Hard to postpone urination?: Yes Burning/pain with urination?: No Get up at night to urinate?: No Leakage of urine?: No Urine stream starts and stops?: No Trouble starting stream?: No Do you have to strain to urinate?: No Blood in urine?: No Urinary tract infection?: No Sexually transmitted disease?: No Injury to kidneys or bladder?: No Painful intercourse?: No Weak stream?: No Erection problems?: No Penile pain?: No  Gastrointestinal Nausea?: No Vomiting?: No Indigestion/heartburn?: No Diarrhea?: No Constipation?: No  Constitutional Fever: No Night sweats?: No Weight loss?: No Fatigue?: No  Skin Skin rash/lesions?: No Itching?: No  Eyes Blurred vision?: No Double vision?:  No  Ears/Nose/Throat Sore throat?: No Sinus problems?: No  Hematologic/Lymphatic Swollen glands?: No Easy bruising?: No  Cardiovascular Leg swelling?: No Chest pain?: No  Respiratory Cough?: No Shortness of breath?: No  Endocrine Excessive thirst?: No  Musculoskeletal Back pain?: No Joint pain?: No  Neurological Headaches?: No Dizziness?: No  Psychologic Depression?: No Anxiety?: No  Physical Exam: There were no vitals taken for this visit.  Constitutional:  Alert and oriented, No acute distress. HEENT: [place] AT, moist mucus membranes.  Trachea midline, no masses. Cardiovascular: No clubbing, cyanosis, or edema. Respiratory: Normal respiratory effort, no increased work of breathing. GI: Abdomen is soft, nontender, nondistended, no abdominal masses GU: No CVA tenderness Lymph: No cervical or inguinal lymphadenopathy. Skin: No rashes, bruises or suspicious lesions. Neurologic: Grossly intact, no focal deficits, moving all 4 extremities. Psychiatric: Normal mood and affect.  Laboratory Data: Lab Results  Component Value Date   WBC 5.8 12/16/2017   HGB 8.5 (L) 12/16/2017   HCT 25.8 (L) 12/16/2017   MCV 89.6 12/16/2017   PLT 262 12/16/2017    Lab Results  Component Value Date   CREATININE 1.36 (H) 12/07/2017     Assessment & Plan:   84 year old male with BPH and incomplete bladder emptying.  He feels he is presently at baseline.  He has been on both Myrbetriq and Myrbetriq/oxybutynin without improvement in his symptoms.  The oxybutynin may have contributed to his urinary retention.  Will increase his tamsulosin to 0.8 mg daily.  Follow-up 3 months with bladder scan.  He was instructed to call earlier for worsening voiding symptoms.  - BLADDER SCAN AMB NON-IMAGING   Return in about 3 months (around 04/14/2018) for Recheck, Bladder scan.  Abbie Sons, [place] [address] [place], [place] [address] [phone]

## 2018-01-19 ENCOUNTER — Encounter: Payer: Self-pay | Admitting: Podiatry

## 2018-01-19 ENCOUNTER — Ambulatory Visit (INDEPENDENT_AMBULATORY_CARE_PROVIDER_SITE_OTHER): Payer: Medicare Other | Admitting: Podiatry

## 2018-01-19 ENCOUNTER — Encounter: Payer: Self-pay | Admitting: Urology

## 2018-01-19 DIAGNOSIS — M79674 Pain in right toe(s): Secondary | ICD-10-CM | POA: Diagnosis not present

## 2018-01-19 DIAGNOSIS — L84 Corns and callosities: Secondary | ICD-10-CM | POA: Diagnosis not present

## 2018-01-19 DIAGNOSIS — M2041 Other hammer toe(s) (acquired), right foot: Secondary | ICD-10-CM | POA: Diagnosis not present

## 2018-01-19 DIAGNOSIS — M79675 Pain in left toe(s): Secondary | ICD-10-CM | POA: Diagnosis not present

## 2018-01-19 DIAGNOSIS — B351 Tinea unguium: Secondary | ICD-10-CM | POA: Diagnosis not present

## 2018-01-19 DIAGNOSIS — M2042 Other hammer toe(s) (acquired), left foot: Secondary | ICD-10-CM

## 2018-01-19 NOTE — Progress Notes (Signed)
Complaint:  Visit Type: Patient returns to my office for evaluation of both feet. Complaint: Patient states" my nails have grown long and thick and become painful to walk and wear shoes" . The patient presents for preventative foot care services. No changes to ROS.  Patient has corn fourth toe right foot.  Podiatric Exam: Vascular: dorsalis pedis and posterior tibial pulses are palpable bilateral. Capillary return is immediate. Temperature gradient is WNL. Skin turgor WNL  Sensorium: Normal Semmes Weinstein monofilament test. Normal tactile sensation bilaterally. Nail Exam: Pt has thick disfigured discolored nails with subungual debris noted bilateral entire nail hallux through fifth toenails Ulcer Exam: There is no evidence of ulcer or pre-ulcerative changes or infection. Orthopedic Exam: Muscle tone and strength are WNL. No limitations in general ROM. No crepitus or effusions noted. Hammer toes  2-4  B/L. Skin: No Porokeratosis. No infection or ulcers.  Heloma durum  4th toe right foot.  Diagnosis:  Onychomycosis, , Pain in right toe, pain in left toes,  Heloma durum 4th toe right foot.  Treatment & Plan Procedures and Treatment: Consent by patient was obtained for treatment procedures.   Debridement of mycotic and hypertrophic toenails, 1 through 5 bilateral and clearing of subungual debris. Debride corn 4th toe right foot. No ulceration, no infection noted.  Return Visit-Office Procedure: Patient instructed to return to the office for a follow up visit 4 months for continued evaluation and treatment.    Gardiner Barefoot DPM

## 2018-02-11 ENCOUNTER — Encounter: Payer: Self-pay | Admitting: Gastroenterology

## 2018-02-11 ENCOUNTER — Other Ambulatory Visit
Admission: RE | Admit: 2018-02-11 | Discharge: 2018-02-11 | Disposition: A | Discharge status: Home / Self Care | Payer: Medicare Other | Source: Ambulatory Visit | Attending: Gastroenterology | Admitting: Gastroenterology

## 2018-02-11 ENCOUNTER — Ambulatory Visit (INDEPENDENT_AMBULATORY_CARE_PROVIDER_SITE_OTHER): Payer: Medicare Other | Admitting: Gastroenterology

## 2018-02-11 VITALS — BP 164/73 | HR 56 | Resp 16 | Ht 72.0 in | Wt 188.0 lb

## 2018-02-11 DIAGNOSIS — K529 Noninfective gastroenteritis and colitis, unspecified: Secondary | ICD-10-CM | POA: Diagnosis not present

## 2018-02-11 DIAGNOSIS — E538 Deficiency of other specified B group vitamins: Secondary | ICD-10-CM | POA: Insufficient documentation

## 2018-02-11 DIAGNOSIS — D500 Iron deficiency anemia secondary to blood loss (chronic): Secondary | ICD-10-CM

## 2018-02-11 LAB — VITAMIN B12: VITAMIN B 12: 379 pg/mL (ref 180–914)

## 2018-02-11 LAB — CBC
HEMATOCRIT: 37.5 % — AB (ref 40.0–52.0)
HEMOGLOBIN: 12.4 g/dL — AB (ref 13.0–18.0)
MCH: 27.9 pg (ref 26.0–34.0)
MCHC: 32.9 g/dL (ref 32.0–36.0)
MCV: 84.6 fL (ref 80.0–100.0)
Platelets: 190 10*3/uL (ref 150–440)
RBC: 4.43 MIL/uL (ref 4.40–5.90)
RDW: 16.2 % — ABNORMAL HIGH (ref 11.5–14.5)
WBC: 7.3 10*3/uL (ref 3.8–10.6)

## 2018-02-11 LAB — IRON AND TIBC
IRON: 23 ug/dL — AB (ref 45–182)
Saturation Ratios: 6 % — ABNORMAL LOW (ref 17.9–39.5)
TIBC: 391 ug/dL (ref 250–450)
UIBC: 368 ug/dL

## 2018-02-11 LAB — FERRITIN: Ferritin: 15 ng/mL — ABNORMAL LOW (ref 24–336)

## 2018-02-11 NOTE — Progress Notes (Signed)
[name], MD [address]  [place]  [place], [place] [address]  Main: [phone]  Fax: [phone]    Gastroenterology Consultation  Referring Provider:     Derinda Late, MD Primary Care Physician:  Derinda Late, MD Primary Gastroenterologist:  Dr. [name] Reason for Consultation: Anemia and chronic nonbloody diarrhea        HPI:   Michael Gilbert is a 84 y.o. male referred by Dr. Derinda Late, MD  for consultation & management of rectal bleeding. He is here at [facility] follow-up. Hospital summary: he was recently admitted to [facility] on 12/05/2017 secondary to severe symptomatic anemia resulting in hemorrhagic shock, from hematochezia.he was taking aspirin 81 mg twice a day. His hemoglobin dropped to 7.5 and underwent blood transfusion. He initially underwent bleeding scan which was suspicious for bleeding in sigmoid colon versus small bowel. Subsequently, underwent colonoscopy which revealed severe sigmoid diverticulosis and large nonbleeding internal hemorrhoids. There was no evidence of active bleeding. His hemoglobin on discharge was 8.4  Follow-up visit 12/16/2017: He denies any further episodes of bleeding per rectum since discharge. He is taking aspirin 81 mgdaily, oral iron 325 mg twice daily and Prilosec 40 mg every other day.he also reports that he has been experiencing several years history of intermittent episodes of severe gurgling/rumbling, gas associated with explosive, nonbloody loose stools or sometimes mucus which last about a week. These episodes occur every 2 months or so. He has normal bowel movements and does not have any other symptoms in between these episodes. He has been taking align once a day. He did not find any correlation to the foods. He denies being lactose intolerant. He also reports long-term history of reflux which is fairly under control currently on Prilosec every other day. He denies having an upper  endoscopy in the past.   Follow-up visit 02/11/2018 Patient has been feeling lethargic lately. He tried VSL# 3 for chronic diarrhea with no benefit He could not afford rifaximin as a co-pay was $1600 He continues to have alternating episodes of diarrhea and constipation associated with abdominal bloating and passing mucus. he denies melena, rectal bleeding He had CBC checked by his primary care doctor about 4 weeks ago and hemoglobin continues to improve He had H. Pylori IgG positive, underwent EGD with gastric biopsies which did not reveal evidence of H. pylori  NSAIDs: none  Antiplts/Anticoagulants/Anti thrombotics: aspirin 81 mg  GI Procedures: EGD 01/01/2018  DIAGNOSIS:  A. RANDOM STOMACH; COLD BIOPSY:  - OXYNTIC MUCOSA WITH MILD CHRONIC GASTRITIS AND PROTON PUMP INHIBITOR  EFFECT.  - NEGATIVE FOR DYSPLASIA AND MALIGNANCY.   Note: Positive H. pylori antibody is noted. Serologic testing is no  longer recommended since the H. pylori stool antigen and urea breath  tests can be used to both diagnose and monitor response to therapy for  H. pylori infection. Immunohistochemistry may not be helpful in this  case due to low organism burden from use of proton pump inhibitors.  Organisms are not seen on routine HE stained sections.  Colonoscopy 12/06/17 Findings: - Two 4 to 5 mm polyps in the transverse colon, removed with a cold snare. Resected and retrieved. - Diverticulosis in the sigmoid colon. - No evidence of active bleeding - He probably had divertucular bleed in sigmoid colon based on bleeding scan secondary to chronic use of aspirin 81mg  BID - The examined portion of the ileum was normal. - Non-bleeding internal hemorrhoids. - The examination was otherwise normal.  DIAGNOSIS:  A. COLON POLYP, TRANSVERSE; COLD SNARE:  - TUBULAR ADENOMA, 2 FRAGMENTS.  - NEGATIVE FOR HIGH-GRADE DYSPLASIA AND MALIGNANCY.  Past Medical History:  Diagnosis Date  . Allergy   . Bradycardia    . Colorectal polyps   . Diverticulosis   . GERD (gastroesophageal reflux disease)   . Hypertension     Past Surgical History:  Procedure Laterality Date  . CATARACT EXTRACTION    . COLONOSCOPY WITH PROPOFOL N/A 12/06/2017   Procedure: COLONOSCOPY WITH PROPOFOL;  Surgeon: Lin Landsman, MD;  Location: [facility] ENDOSCOPY;  Service: Gastroenterology;  Laterality: N/A;  . ESOPHAGOGASTRODUODENOSCOPY (EGD) WITH PROPOFOL N/A 01/01/2018   Procedure: ESOPHAGOGASTRODUODENOSCOPY (EGD) WITH PROPOFOL;  Surgeon: Lin Landsman, MD;  Location: [facility] ENDOSCOPY;  Service: Gastroenterology;  Laterality: N/A;  . TONSILECTOMY, ADENOIDECTOMY, BILATERAL MYRINGOTOMY AND TUBES       Current Outpatient Medications:  .  Acetaminophen 500 MG coapsule, Take 5,002 mg by mouth every 6 (six) hours as needed. , Disp: , Rfl:  .  ferrous sulfate 325 (65 FE) MG tablet, Take 1 tablet (325 mg total) by mouth 2 (two) times daily with a meal., Disp: 60 tablet, Rfl: 3 .  fluticasone (FLONASE) 50 MCG/ACT nasal spray, Place 1 spray into both nostrils daily as needed. , Disp: , Rfl: 10 .  loratadine (CLARITIN) 10 MG tablet, Take 10 mg by mouth daily as needed. , Disp: , Rfl:  .  losartan (COZAAR) 50 MG tablet, Take 50 mg by mouth daily. , Disp: , Rfl:  .  Multiple Vitamin (MULTI-VITAMINS) TABS, Take 1 tablet by mouth daily. , Disp: , Rfl:  .  omeprazole (PRILOSEC) 40 MG capsule, Take 40 mg by mouth daily. , Disp: , Rfl: 10 .  tamsulosin (FLOMAX) 0.4 MG CAPS capsule, Take 2 capsules (0.8 mg total) by mouth daily., Disp: 60 capsule, Rfl: 6   Family History  Problem Relation Age of Onset  . Diabetes Father   . Diabetes Brother   . Bladder Cancer Neg Hx   . Kidney cancer Neg Hx   . Prostate cancer Neg Hx      Social History   Tobacco Use  . Smoking status: Former Research scientist (life sciences)  . Smokeless tobacco: Never Used  Substance Use Topics  . Alcohol use: Yes    Alcohol/week: 0.6 oz    Types: 1 Glasses of wine per week     Comment: 1 glass of wine daily  . Drug use: No    Allergies as of 02/11/2018 - Review Complete 02/11/2018  Allergen Reaction Noted  . Ace inhibitors Cough 06/03/2014  . Lovastatin Other (See Comments) 06/03/2014  . Simvastatin  06/03/2014    Review of Systems:    All systems reviewed and negative except where noted in HPI.   Physical Exam:  BP (!) 164/73   Pulse (!) 56   Resp 16   Ht 6' (1.829 m)   Wt 188 lb (85.3 kg)   BMI 25.50 kg/m  No LMP for male patient.  General:   Alert,  Well-developed, well-nourished, pleasant and cooperative in NAD Head:  Normocephalic and atraumatic. Eyes:  Sclera clear, no icterus.   Conjunctiva pink. Ears:  Normal auditory acuity. Nose:  No deformity, discharge, or lesions. Mouth:  No deformity or lesions,oropharynx pink & moist. Neck:  Supple; no masses or thyromegaly. Lungs:  Respirations even and unlabored.  Clear throughout to auscultation.   No wheezes, crackles, or rhonchi. No acute distress. Heart:  Regular rate and rhythm; no  murmurs, clicks, rubs, or gallops. Abdomen:  Normal bowel sounds. Soft, non-tender and non-distended without masses, hepatosplenomegaly or hernias noted.  No guarding or rebound tenderness.   Rectal: Not performed Msk:  Symmetrical without gross deformities. Good, equal movement & strength bilaterally. Pulses:  Normal pulses noted. Extremities:  No clubbing or edema.  No cyanosis. Neurologic:  Alert and oriented x3;  grossly normal neurologically. Skin:  Intact without significant lesions or rashes. No jaundice. Lymph Nodes:  No significant cervical adenopathy. Psych:  Alert and cooperative. Normal mood and affect.  Imaging Studies: reviewed  Assessment and Plan:   ALDEAN PIPE is a 84 y.o. male with recent history of hemodynamically significant lower GI bleed, most likely diverticular in origin. He also has several years history of intermittent episodes of gas, nonbloody diarrhea which are self-limiting  without any alarm signs or symptoms  Normocytic anemia: Secondary to lower GI bleed, probably diverticular - Check CBC, ferritin, Vitamin B12 - Continue oral iron for now - If he has recurrence of GI bleed, recommend VCE  IBS-diarrhea:currently asymptomatic - check celiac serologies - Check pancreatic fecal elastase  Follow up in 2 weeks   [name], MD

## 2018-02-12 LAB — TISSUE TRANSGLUTAMINASE, IGA: Tissue Transglutaminase Ab, IgA: <2 U/mL (ref 0–3)

## 2018-02-12 LAB — IGA: IGA: 556 mg/dL — AB (ref 61–437)

## 2018-02-19 ENCOUNTER — Encounter: Payer: Self-pay | Admitting: Gastroenterology

## 2018-02-19 ENCOUNTER — Ambulatory Visit (INDEPENDENT_AMBULATORY_CARE_PROVIDER_SITE_OTHER): Payer: Medicare Other | Admitting: Gastroenterology

## 2018-02-19 VITALS — BP 156/77 | HR 73 | Temp 97.7°F | Ht 72.0 in | Wt 188.6 lb

## 2018-02-19 DIAGNOSIS — D508 Other iron deficiency anemias: Secondary | ICD-10-CM

## 2018-02-19 DIAGNOSIS — K580 Irritable bowel syndrome with diarrhea: Secondary | ICD-10-CM | POA: Diagnosis not present

## 2018-02-19 NOTE — Progress Notes (Signed)
Michael Darby, MD [address]  [place]  [place], [place] [address]  Main: [phone]  Fax: [phone]    Gastroenterology Consultation  Referring Provider:     Derinda Late, MD Primary Care Physician:  Derinda Late, MD Primary Gastroenterologist:  Dr. Cephas Gilbert Reason for Consultation: Iron def anemia and IBS-diarrhea        HPI:   Michael Gilbert is a 84 y.o. male referred by Dr. Derinda Late, MD  for consultation & management of rectal bleeding. He is here at [facility] follow-up. Hospital summary: he was recently admitted to [facility] on 12/05/2017 secondary to severe symptomatic anemia resulting in hemorrhagic shock, from hematochezia.he was taking aspirin 81 mg twice a day. His hemoglobin dropped to 7.5 and underwent blood transfusion. He initially underwent bleeding scan which was suspicious for bleeding in sigmoid colon versus small bowel. Subsequently, underwent colonoscopy which revealed severe sigmoid diverticulosis and large nonbleeding internal hemorrhoids. There was no evidence of active bleeding. His hemoglobin on discharge was 8.4  Follow-up visit 12/16/2017: He denies any further episodes of bleeding per rectum since discharge. He is taking aspirin 81 mgdaily, oral iron 325 mg twice daily and Prilosec 40 mg every other day.he also reports that he has been experiencing several years history of intermittent episodes of severe gurgling/rumbling, gas associated with explosive, nonbloody loose stools or sometimes mucus which last about a week. These episodes occur every 2 months or so. He has normal bowel movements and does not have any other symptoms in between these episodes. He has been taking align once a day. He did not find any correlation to the foods. He denies being lactose intolerant. He also reports long-term history of reflux which is fairly under control currently on Prilosec every other day. He denies having an upper  endoscopy in the past.   Follow-up visit 02/11/2018 Patient has been feeling lethargic lately. He tried VSL# 3 for chronic diarrhea with no benefit He could not afford rifaximin as a co-pay was $1600 He continues to have alternating episodes of diarrhea and constipation associated with abdominal bloating and passing mucus. he denies melena, rectal bleeding He had CBC checked by his primary care doctor about 4 weeks ago and hemoglobin continues to improve He had H. Pylori IgG positive, underwent EGD with gastric biopsies which did not reveal evidence of H. Pylori  Follow-up visit 02/19/2018 Patient is here to discuss about the lab results. His ferritin levels are low but hemoglobin has nicely improved. He is currently taking oral iron 2-3 pills daily. He had normal B12 levels. He continues to experience sporadic episodes of nonbloody watery bowel movements. He has normal formed bowel movement daily in between these episodes. The symptoms have been ongoing for several years. TTG IgA serologies came back negative. I did not perform random colon biopsies during his last colonoscopy as it was done in the setting of diverticular bleed as inpatient. He does not have any constitutional symptoms. He is taking probiotics daily, cannot remember the name of the brand  NSAIDs: none  Antiplts/Anticoagulants/Anti thrombotics: aspirin 81 mg  GI Procedures: EGD 01/01/2018  DIAGNOSIS:  A. RANDOM STOMACH; COLD BIOPSY:  - OXYNTIC MUCOSA WITH MILD CHRONIC GASTRITIS AND PROTON PUMP INHIBITOR  EFFECT.  - NEGATIVE FOR DYSPLASIA AND MALIGNANCY.   Note: Positive H. pylori antibody is noted. Serologic testing is no  longer recommended since the H. pylori stool antigen and urea breath  tests can be used to both diagnose  and monitor response to therapy for  H. pylori infection. Immunohistochemistry may not be helpful in this  case due to low organism burden from use of proton pump inhibitors.  Organisms are not  seen on routine HE stained sections.  Colonoscopy 12/06/17 Findings: - Two 4 to 5 mm polyps in the transverse colon, removed with a cold snare. Resected and retrieved. - Diverticulosis in the sigmoid colon. - No evidence of active bleeding - He probably had divertucular bleed in sigmoid colon based on bleeding scan secondary to chronic use of aspirin 81mg  BID - The examined portion of the ileum was normal. - Non-bleeding internal hemorrhoids. - The examination was otherwise normal.  DIAGNOSIS:  A. COLON POLYP, TRANSVERSE; COLD SNARE:  - TUBULAR ADENOMA, 2 FRAGMENTS.  - NEGATIVE FOR HIGH-GRADE DYSPLASIA AND MALIGNANCY.  Past Medical History:  Diagnosis Date  . Allergy   . Bradycardia   . Colorectal polyps   . Diverticulosis   . GERD (gastroesophageal reflux disease)   . Hypertension     Past Surgical History:  Procedure Laterality Date  . CATARACT EXTRACTION    . COLONOSCOPY WITH PROPOFOL N/A 12/06/2017   Procedure: COLONOSCOPY WITH PROPOFOL;  Surgeon: Lin Landsman, MD;  Location: [facility] ENDOSCOPY;  Service: Gastroenterology;  Laterality: N/A;  . ESOPHAGOGASTRODUODENOSCOPY (EGD) WITH PROPOFOL N/A 01/01/2018   Procedure: ESOPHAGOGASTRODUODENOSCOPY (EGD) WITH PROPOFOL;  Surgeon: Lin Landsman, MD;  Location: [facility] ENDOSCOPY;  Service: Gastroenterology;  Laterality: N/A;  . TONSILECTOMY, ADENOIDECTOMY, BILATERAL MYRINGOTOMY AND TUBES       Current Outpatient Medications:  .  Acetaminophen 500 MG coapsule, Take 5,002 mg by mouth every 6 (six) hours as needed. , Disp: , Rfl:  .  ferrous sulfate 325 (65 FE) MG tablet, Take 1 tablet (325 mg total) by mouth 2 (two) times daily with a meal., Disp: 60 tablet, Rfl: 3 .  fluticasone (FLONASE) 50 MCG/ACT nasal spray, Place 1 spray into both nostrils daily as needed. , Disp: , Rfl: 10 .  loratadine (CLARITIN) 10 MG tablet, Take 10 mg by mouth daily as needed. , Disp: , Rfl:  .  losartan (COZAAR) 50 MG tablet, Take 50 mg by mouth  daily. , Disp: , Rfl:  .  Multiple Vitamin (MULTI-VITAMINS) TABS, Take 1 tablet by mouth daily. , Disp: , Rfl:  .  omeprazole (PRILOSEC) 40 MG capsule, Take 40 mg by mouth daily. , Disp: , Rfl: 10 .  tamsulosin (FLOMAX) 0.4 MG CAPS capsule, Take 2 capsules (0.8 mg total) by mouth daily., Disp: 60 capsule, Rfl: 6   Family History  Problem Relation Age of Onset  . Diabetes Father   . Diabetes Brother   . Bladder Cancer Neg Hx   . Kidney cancer Neg Hx   . Prostate cancer Neg Hx      Social History   Tobacco Use  . Smoking status: Former Research scientist (life sciences)  . Smokeless tobacco: Never Used  Substance Use Topics  . Alcohol use: Yes    Alcohol/week: 0.6 oz    Types: 1 Glasses of wine per week    Comment: 1 glass of wine daily  . Drug use: No    Allergies as of 02/19/2018 - Review Complete 02/19/2018  Allergen Reaction Noted  . Ace inhibitors Cough 06/03/2014  . Lovastatin Other (See Comments) 06/03/2014  . Simvastatin  06/03/2014    Review of Systems:    All systems reviewed and negative except where noted in HPI.   Physical Exam:  BP (!) 156/77  Pulse 73   Temp 97.7 F (36.5 C)   Ht 6' (1.829 m)   Wt 188 lb 9.6 oz (85.5 kg)   BMI 25.58 kg/m  No LMP for male patient.  General:   Alert,  Well-developed, well-nourished, pleasant and cooperative in NAD Head:  Normocephalic and atraumatic. Eyes:  Sclera clear, no icterus.   Conjunctiva pink. Ears:  Normal auditory acuity. Nose:  No deformity, discharge, or lesions. Mouth:  No deformity or lesions,oropharynx pink & moist. Neck:  Supple; no masses or thyromegaly. Lungs:  Respirations even and unlabored.  Clear throughout to auscultation.   No wheezes, crackles, or rhonchi. No acute distress. Heart:  Regular rate and rhythm; no murmurs, clicks, rubs, or gallops. Abdomen:  Normal bowel sounds. Soft, non-tender and non-distended without masses, hepatosplenomegaly or hernias noted.  No guarding or rebound tenderness.   Rectal: Not  performed Msk:  Symmetrical without gross deformities. Good, equal movement & strength bilaterally. Pulses:  Normal pulses noted. Extremities:  No clubbing or edema.  No cyanosis. Neurologic:  Alert and oriented x3;  grossly normal neurologically. Skin:  Intact without significant lesions or rashes. No jaundice. Lymph Nodes:  No significant cervical adenopathy. Psych:  Alert and cooperative. Normal mood and affect.  Imaging Studies: reviewed  Assessment and Plan:   JAVARRI SEGAL is a 84 y.o. caucasian male with history of hemodynamically significant lower GI bleed, most likely diverticular in origin. He also has several years history of intermittent episodes of gas, nonbloody diarrhea which are self-limiting without any alarm signs or symptoms  Normocytic iron deficiency anemia: Secondary to lower GI bleed, probably diverticular - Hb improving - Continue oral iron for now - If he has recurrence of GI bleed, recommend VCE  IBS-diarrhea: with intermittent episodes - celiac serologies negative - Tried VSL#3, not effective - Rifaximin was expensive - Trail of imodium or IB-gaurd - Check pancreatic fecal elastase  Follow up in 4 weeks   Michael Darby, MD

## 2018-03-04 ENCOUNTER — Ambulatory Visit: Payer: Medicare Other | Admitting: Gastroenterology

## 2018-03-17 ENCOUNTER — Ambulatory Visit: Payer: Medicare Other | Admitting: Gastroenterology

## 2018-04-17 ENCOUNTER — Ambulatory Visit (INDEPENDENT_AMBULATORY_CARE_PROVIDER_SITE_OTHER): Payer: Medicare Other | Admitting: Gastroenterology

## 2018-04-17 ENCOUNTER — Other Ambulatory Visit: Payer: Self-pay

## 2018-04-17 ENCOUNTER — Encounter: Payer: Self-pay | Admitting: Gastroenterology

## 2018-04-17 VITALS — BP 123/64 | HR 60 | Resp 16 | Ht 72.0 in | Wt 185.0 lb

## 2018-04-17 DIAGNOSIS — K580 Irritable bowel syndrome with diarrhea: Secondary | ICD-10-CM

## 2018-04-17 NOTE — Progress Notes (Signed)
[name], MD [address]  [place]  [place],  [address]  Main: [phone]  Fax: [phone]    Gastroenterology Consultation  Referring Provider:     Derinda Late, MD Primary Care Physician:  Derinda Late, MD Primary Gastroenterologist:  Dr. [name] Reason for Consultation: Iron def anemia and IBS-diarrhea        HPI:   NATHANIEL WAKELEY is a 84 y.o. male referred by Dr. Derinda Late, MD  for consultation & management of rectal bleeding. He is here at [facility] follow-up. Hospital summary: he was recently admitted to [facility] on 12/05/2017 secondary to severe symptomatic anemia resulting in hemorrhagic shock, from hematochezia.he was taking aspirin 81 mg twice a day. His hemoglobin dropped to 7.5 and underwent blood transfusion. He initially underwent bleeding scan which was suspicious for bleeding in sigmoid colon versus small bowel. Subsequently, underwent colonoscopy which revealed severe sigmoid diverticulosis and large nonbleeding internal hemorrhoids. There was no evidence of active bleeding. His hemoglobin on discharge was 8.4  Follow-up visit 12/16/2017: He denies any further episodes of bleeding per rectum since discharge. He is taking aspirin 81 mgdaily, oral iron 325 mg twice daily and Prilosec 40 mg every other day.he also reports that he has been experiencing several years history of intermittent episodes of severe gurgling/rumbling, gas associated with explosive, nonbloody loose stools or sometimes mucus which last about a week. These episodes occur every 2 months or so. He has normal bowel movements and does not have any other symptoms in between these episodes. He has been taking align once a day. He did not find any correlation to the foods. He denies being lactose intolerant. He also reports long-term history of reflux which is fairly under control currently on Prilosec every other day. He denies having an upper  endoscopy in the past.   Follow-up visit 02/11/2018 Patient has been feeling lethargic lately. He tried VSL# 3 for chronic diarrhea with no benefit He could not afford rifaximin as a co-pay was $1600 He continues to have alternating episodes of diarrhea and constipation associated with abdominal bloating and passing mucus. he denies melena, rectal bleeding He had CBC checked by his primary care doctor about 4 weeks ago and hemoglobin continues to improve He had H. Pylori IgG positive, underwent EGD with gastric biopsies which did not reveal evidence of H. Pylori  Follow-up visit 02/19/2018 Patient is here to discuss about the lab results. His ferritin levels are low but hemoglobin has nicely improved. He is currently taking oral iron 2-3 pills daily. He had normal B12 levels. He continues to experience sporadic episodes of nonbloody watery bowel movements. He has normal formed bowel movement daily in between these episodes. The symptoms have been ongoing for several years. TTG IgA serologies came back negative. I did not perform random colon biopsies during his last colonoscopy as it was done in the setting of diverticular bleed as inpatient. He does not have any constitutional symptoms. He is taking probiotics daily, cannot remember the name of the brand  Follow-up visit 04/17/2018 He reports that his symptoms are currently under control and did not have any last 1 month. He is taking Imodium when diarrhea recurs, helps by not having a BM for 2 days then becomes normal. He is taking IB guard 2 capsules with each meal daily and seems to help with his IBS symptoms. He stopped probiotics. He recently had labs done performed by his PCP. Hemoglobin 12.8, normal MCV. He  is taking oral iron one pill daily He continues to play golf  NSAIDs: none  Antiplts/Anticoagulants/Anti thrombotics: aspirin 81 mg  GI Procedures: EGD 01/01/2018  DIAGNOSIS:  A. RANDOM STOMACH; COLD BIOPSY:  - OXYNTIC MUCOSA WITH  MILD CHRONIC GASTRITIS AND PROTON PUMP INHIBITOR  EFFECT.  - NEGATIVE FOR DYSPLASIA AND MALIGNANCY.   Note: Positive H. pylori antibody is noted. Serologic testing is no  longer recommended since the H. pylori stool antigen and urea breath  tests can be used to both diagnose and monitor response to therapy for  H. pylori infection. Immunohistochemistry may not be helpful in this  case due to low organism burden from use of proton pump inhibitors.  Organisms are not seen on routine HE stained sections.  Colonoscopy 12/06/17 Findings: - Two 4 to 5 mm polyps in the transverse colon, removed with a cold snare. Resected and retrieved. - Diverticulosis in the sigmoid colon. - No evidence of active bleeding - He probably had divertucular bleed in sigmoid colon based on bleeding scan secondary to chronic use of aspirin 81mg  BID - The examined portion of the ileum was normal. - Non-bleeding internal hemorrhoids. - The examination was otherwise normal.  DIAGNOSIS:  A. COLON POLYP, TRANSVERSE; COLD SNARE:  - TUBULAR ADENOMA, 2 FRAGMENTS.  - NEGATIVE FOR HIGH-GRADE DYSPLASIA AND MALIGNANCY.  Past Medical History:  Diagnosis Date  . Allergy   . Bradycardia   . Colorectal polyps   . Diverticulosis   . GERD (gastroesophageal reflux disease)   . Hypertension     Past Surgical History:  Procedure Laterality Date  . CATARACT EXTRACTION    . COLONOSCOPY WITH PROPOFOL N/A 12/06/2017   Procedure: COLONOSCOPY WITH PROPOFOL;  Surgeon: Lin Landsman, MD;  Location: [facility] ENDOSCOPY;  Service: Gastroenterology;  Laterality: N/A;  . ESOPHAGOGASTRODUODENOSCOPY (EGD) WITH PROPOFOL N/A 01/01/2018   Procedure: ESOPHAGOGASTRODUODENOSCOPY (EGD) WITH PROPOFOL;  Surgeon: Lin Landsman, MD;  Location: [facility] ENDOSCOPY;  Service: Gastroenterology;  Laterality: N/A;  . TONSILECTOMY, ADENOIDECTOMY, BILATERAL MYRINGOTOMY AND TUBES       Current Outpatient Medications:  .  Acetaminophen 500 MG  coapsule, Take 5,002 mg by mouth every 6 (six) hours as needed. , Disp: , Rfl:  .  ferrous sulfate 325 (65 FE) MG tablet, Take 1 tablet (325 mg total) by mouth 2 (two) times daily with a meal., Disp: 60 tablet, Rfl: 3 .  fluticasone (FLONASE) 50 MCG/ACT nasal spray, Place 1 spray into both nostrils daily as needed. , Disp: , Rfl: 10 .  losartan (COZAAR) 50 MG tablet, Take 50 mg by mouth daily. , Disp: , Rfl:  .  Multiple Vitamin (MULTI-VITAMINS) TABS, Take 1 tablet by mouth daily. , Disp: , Rfl:  .  omeprazole (PRILOSEC) 20 MG capsule, , Disp: , Rfl: 2 .  tamsulosin (FLOMAX) 0.4 MG CAPS capsule, Take 2 capsules (0.8 mg total) by mouth daily., Disp: 60 capsule, Rfl: 6   Family History  Problem Relation Age of Onset  . Diabetes Father   . Diabetes Brother   . Bladder Cancer Neg Hx   . Kidney cancer Neg Hx   . Prostate cancer Neg Hx      Social History   Tobacco Use  . Smoking status: Former Research scientist (life sciences)  . Smokeless tobacco: Never Used  Substance Use Topics  . Alcohol use: Yes    Alcohol/week: 0.6 oz    Types: 1 Glasses of wine per week    Comment: 1 glass of wine daily  . Drug use:  No    Allergies as of 04/17/2018 - Review Complete 04/17/2018  Allergen Reaction Noted  . Ace inhibitors Cough 06/03/2014  . Lovastatin Other (See Comments) 06/03/2014  . Simvastatin  06/03/2014    Review of Systems:    All systems reviewed and negative except where noted in HPI.   Physical Exam:  BP 123/64 (BP Location: Left Arm, Patient Position: Sitting)   Pulse 60   Resp 16   Ht 6' (1.829 m)   Wt 185 lb (83.9 kg)   BMI 25.09 kg/m  No LMP for male patient.  General:   Alert,  Well-developed, well-nourished, pleasant and cooperative in NAD Head:  Normocephalic and atraumatic. Eyes:  Sclera clear, no icterus.   Conjunctiva pink. Ears:  Normal auditory acuity. Nose:  No deformity, discharge, or lesions. Mouth:  No deformity or lesions,oropharynx pink & moist. Neck:  Supple; no masses or  thyromegaly. Lungs:  Respirations even and unlabored.  Clear throughout to auscultation.   No wheezes, crackles, or rhonchi. No acute distress. Heart:  Regular rate and rhythm; no murmurs, clicks, rubs, or gallops. Abdomen:  Normal bowel sounds. Soft, non-tender and non-distended without masses, hepatosplenomegaly or hernias noted.  No guarding or rebound tenderness.   Rectal: Not performed Msk:  Symmetrical without gross deformities. Good, equal movement & strength bilaterally. Pulses:  Normal pulses noted. Extremities:  No clubbing or edema.  No cyanosis. Neurologic:  Alert and oriented x3;  grossly normal neurologically. Skin:  Intact without significant lesions or rashes. No jaundice. Lymph Nodes:  No significant cervical adenopathy. Psych:  Alert and cooperative. Normal mood and affect.  Imaging Studies: reviewed  Assessment and Plan:   DUSHAWN PUSEY is a 84 y.o. caucasian male with history of hemodynamically significant lower GI bleed, most likely diverticular in origin. He also has several years history of intermittent episodes of gas, nonbloody diarrhea which are self-limiting without any alarm signs or symptoms  Normocytic iron deficiency anemia: Secondary to lower GI bleed, probably diverticular - Hb improving - Continue oral iron for now - recheck hemoglobin and ferritin in 3 months - If he has recurrence of GI bleed, recommend VCE  IBS-diarrhea: with intermittent episodes - celiac serologies negative - Tried VSL#3, not effective - Rifaximin was expensive - Trail of imodium and IB-guard, successful, will continue these for now  Follow up in 3 months   [name], MD

## 2018-04-20 ENCOUNTER — Encounter: Payer: Self-pay | Admitting: Urology

## 2018-04-20 ENCOUNTER — Ambulatory Visit (INDEPENDENT_AMBULATORY_CARE_PROVIDER_SITE_OTHER): Payer: Medicare Other | Admitting: Urology

## 2018-04-20 VITALS — BP 145/70 | HR 76 | Resp 16 | Ht 72.0 in | Wt 186.0 lb

## 2018-04-20 DIAGNOSIS — N401 Enlarged prostate with lower urinary tract symptoms: Secondary | ICD-10-CM

## 2018-04-20 LAB — BLADDER SCAN AMB NON-IMAGING

## 2018-04-20 NOTE — Progress Notes (Signed)
04/20/2018 2:18 PM   Denese Killings 1928-05-05 [id number]  Referring provider: Derinda Late, MD [phone] [address] [facility] [place], [place] [address]  Chief Complaint  Patient presents with  . Follow-up    HPI: 84 year old male seen February 2019 after an episode of urinary retention while hospitalized for a GI bleed.  He was off tamsulosin during his hospitalization.  He was placed back on this medication and in follow-up his PVR was 255 mL and he felt he was back at baseline.  He presents for a 37-month follow-up and states he is doing well.  He does have urinary frequency and urgency without incontinence which is not bothersome.  Denies dysuria or gross hematuria.  Denies flank, abdominal, pelvic or scrotal pain. He remains on tamsulosin and has no complaints today.  PMH: Past Medical History:  Diagnosis Date  . Allergy   . Bradycardia   . Colorectal polyps   . Diverticulosis   . GERD (gastroesophageal reflux disease)   . Hypertension     Surgical History: Past Surgical History:  Procedure Laterality Date  . CATARACT EXTRACTION    . COLONOSCOPY WITH PROPOFOL N/A 12/06/2017   Procedure: COLONOSCOPY WITH PROPOFOL;  Surgeon: Lin Landsman, MD;  Location: [facility] ENDOSCOPY;  Service: Gastroenterology;  Laterality: N/A;  . ESOPHAGOGASTRODUODENOSCOPY (EGD) WITH PROPOFOL N/A 01/01/2018   Procedure: ESOPHAGOGASTRODUODENOSCOPY (EGD) WITH PROPOFOL;  Surgeon: Lin Landsman, MD;  Location: [facility] ENDOSCOPY;  Service: Gastroenterology;  Laterality: N/A;  . TONSILECTOMY, ADENOIDECTOMY, BILATERAL MYRINGOTOMY AND TUBES      Home Medications:  Allergies as of 04/20/2018      Reactions   Ace Inhibitors Cough   Lovastatin Other (See Comments)   Other reaction(s): Muscle Pain   Simvastatin    Other reaction(s): Muscle Pain      Medication List        Accurate as of 04/20/18  2:18 PM. Always use your most recent med list.            Acetaminophen 500 MG coapsule Take 5,002 mg by mouth every 6 (six) hours as needed.   ferrous sulfate 325 (65 FE) MG tablet Take 1 tablet (325 mg total) by mouth 2 (two) times daily with a meal.   fluticasone 50 MCG/ACT nasal spray Commonly known as:  FLONASE Place 1 spray into both nostrils daily as needed.   losartan 50 MG tablet Commonly known as:  COZAAR Take 50 mg by mouth daily.   MULTI-VITAMINS Tabs Take 1 tablet by mouth daily.   omeprazole 20 MG capsule Commonly known as:  PRILOSEC   tamsulosin 0.4 MG Caps capsule Commonly known as:  FLOMAX Take 2 capsules (0.8 mg total) by mouth daily.       Allergies:  Allergies  Allergen Reactions  . Ace Inhibitors Cough  . Lovastatin Other (See Comments)    Other reaction(s): Muscle Pain  . Simvastatin     Other reaction(s): Muscle Pain    Family History: Family History  Problem Relation Age of Onset  . Diabetes Father   . Diabetes Brother   . Bladder Cancer Neg Hx   . Kidney cancer Neg Hx   . Prostate cancer Neg Hx     Social History:  reports that he has quit smoking. He has never used smokeless tobacco. He reports that he drinks about 0.6 oz of alcohol per week. He reports that he does not use drugs.  ROS: UROLOGY Frequent Urination?: Yes Hard  to postpone urination?: No Burning/pain with urination?: No Get up at night to urinate?: No Leakage of urine?: No Urine stream starts and stops?: Yes Trouble starting stream?: No Do you have to strain to urinate?: No Blood in urine?: No Urinary tract infection?: No Sexually transmitted disease?: No Injury to kidneys or bladder?: No Painful intercourse?: No Weak stream?: No Erection problems?: No Penile pain?: No  Gastrointestinal Nausea?: No Vomiting?: No Indigestion/heartburn?: No Diarrhea?: No Constipation?: No  Constitutional Fever: No Night sweats?: No Weight loss?: No Fatigue?: No  Skin Skin rash/lesions?: No Itching?: No  Eyes Blurred  vision?: No Double vision?: No  Ears/Nose/Throat Sore throat?: No Sinus problems?: No  Hematologic/Lymphatic Swollen glands?: No Easy bruising?: No  Cardiovascular Leg swelling?: No Chest pain?: No  Respiratory Cough?: No Shortness of breath?: No  Endocrine Excessive thirst?: No  Musculoskeletal Back pain?: No Joint pain?: No  Neurological Headaches?: No Dizziness?: No  Psychologic Depression?: No Anxiety?: No  Physical Exam: BP (!) 145/70   Pulse 76   Resp 16   Ht 6' (1.829 m)   Wt 186 lb (84.4 kg)   SpO2 96%   BMI 25.23 kg/m   Constitutional:  Alert and oriented, No acute distress. HEENT: [place] AT, moist mucus membranes.  Trachea midline, no masses. Cardiovascular: No clubbing, cyanosis, or edema. Respiratory: Normal respiratory effort, no increased work of breathing. GI: Abdomen is soft, nontender, nondistended, no abdominal masses GU: No CVA tenderness Lymph: No cervical or inguinal lymphadenopathy. Skin: No rashes, bruises or suspicious lesions. Neurologic: Grossly intact, no focal deficits, moving all 4 extremities. Psychiatric: Normal mood and affect.    Assessment & Plan:   84 year old male with benign prostatic enlargement and lower urinary tract symptoms.  He is doing well on tamsulosin.  PVR by bladder scan today was 82 mL.  Recommend annual follow-up and he was instructed to call earlier for any worsening lower urinary tract symptoms.  Abbie Sons, [place] [address] [place], [place] [address] [phone]

## 2018-04-21 ENCOUNTER — Ambulatory Visit: Payer: Medicare Other | Admitting: Urology

## 2018-05-21 ENCOUNTER — Ambulatory Visit: Payer: Medicare Other | Admitting: Podiatry

## 2018-07-21 ENCOUNTER — Ambulatory Visit: Payer: Medicare Other | Admitting: Gastroenterology

## 2018-07-23 ENCOUNTER — Ambulatory Visit: Payer: Medicare Other | Admitting: Podiatry

## 2018-07-30 IMAGING — CT CT ABD-PELV W/ CM
2 of 5 series · 16 of 46 positions shown, 18 images · IV contrast (APPLIED)
Comparison: Radionuclide bleeding scan 12/05/2017

CLINICAL DATA: Bright red blood in stool. Decreased blood pressure.
Radionuclide bleeding study yesterday was positive with sore
suggested in left lower quadrant small bowel.

EXAM:
CT ABDOMEN AND PELVIS WITH CONTRAST
TECHNIQUE: Multidetector CT imaging of the abdomen and pelvis was performed
using the standard protocol following bolus administration of
intravenous contrast.
CONTRAST:  75mL PTZZ5L-QDD IOPAMIDOL (PTZZ5L-QDD) INJECTION 61%

[Series 3: routine abd/pel with · axial · 0.89mm/px · z∈[-848,-413]mm · 13 of 97 slices shown, 15 images]
[im 5/97  soft-tissue]
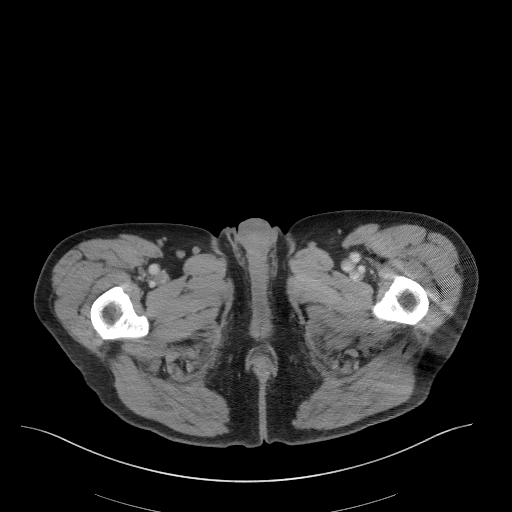
[im 5/97  bone]
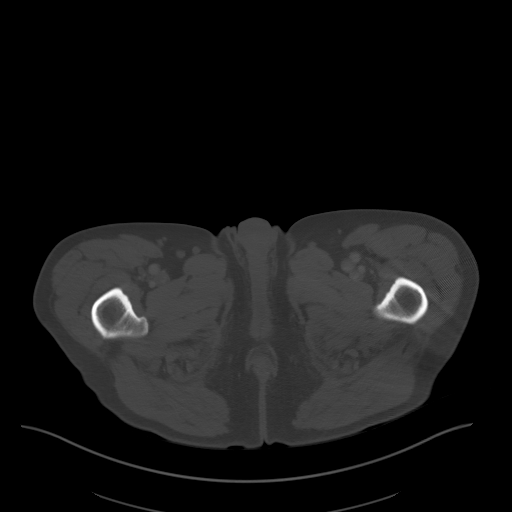
[im 15/97  soft-tissue]
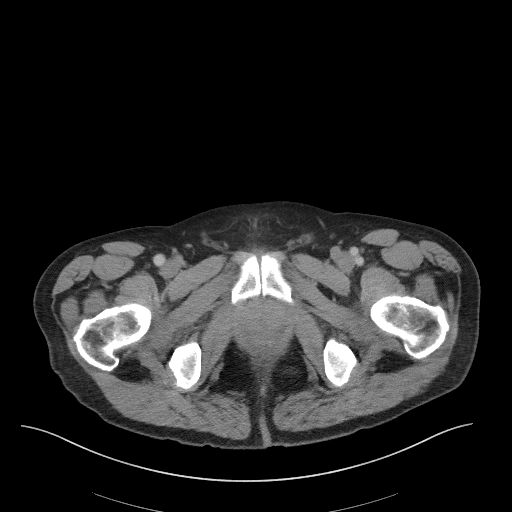
[im 20/97  soft-tissue]
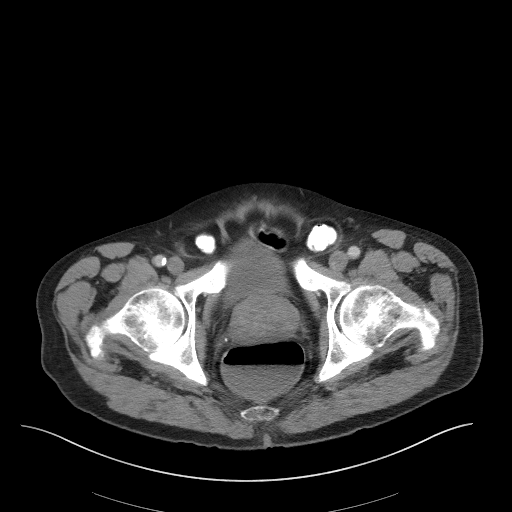
[im 29/97  soft-tissue]
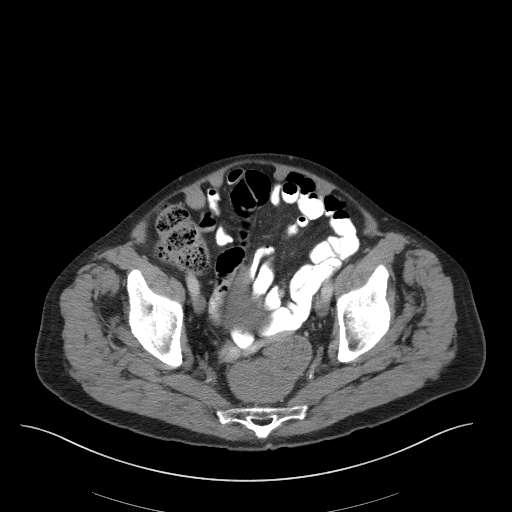
[im 34/97  soft-tissue]
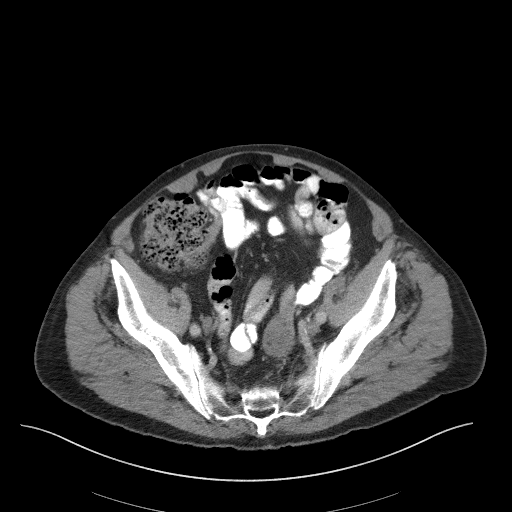
[im 44/97  soft-tissue]
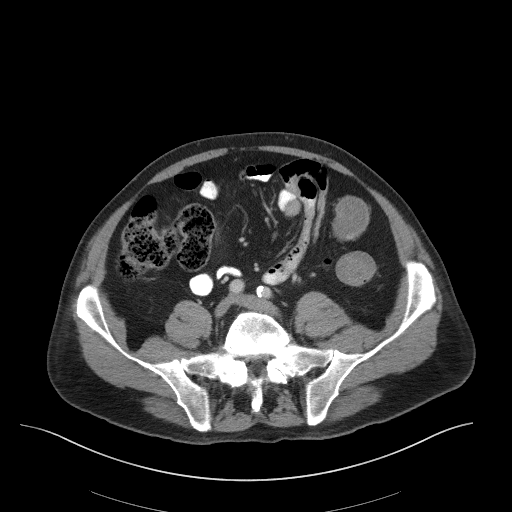
[im 49/97  soft-tissue]
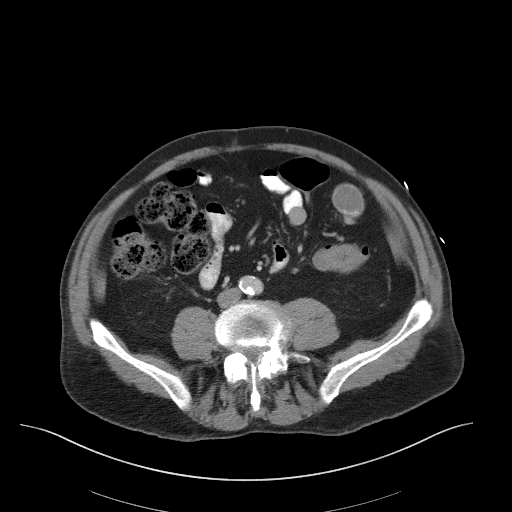
[im 53/97  soft-tissue]
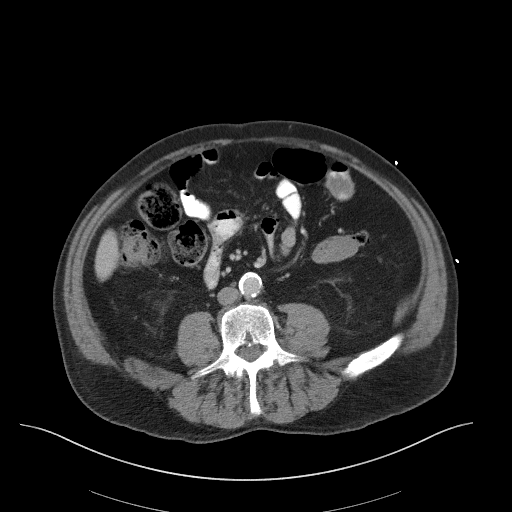
[im 63/97  soft-tissue]
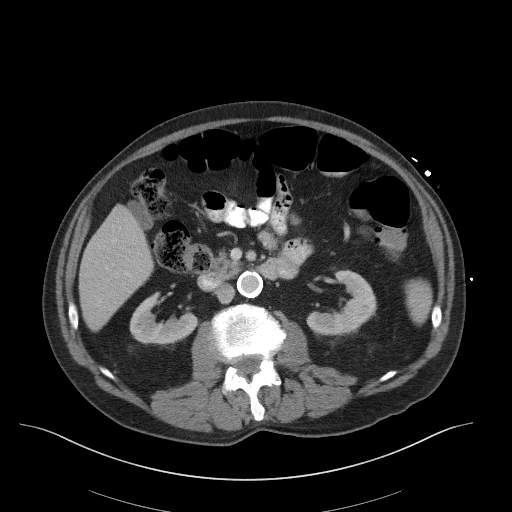
[im 63/97  bone]
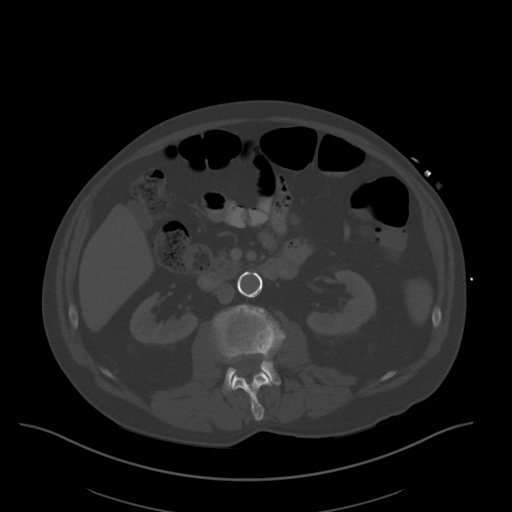
[im 68/97  soft-tissue]
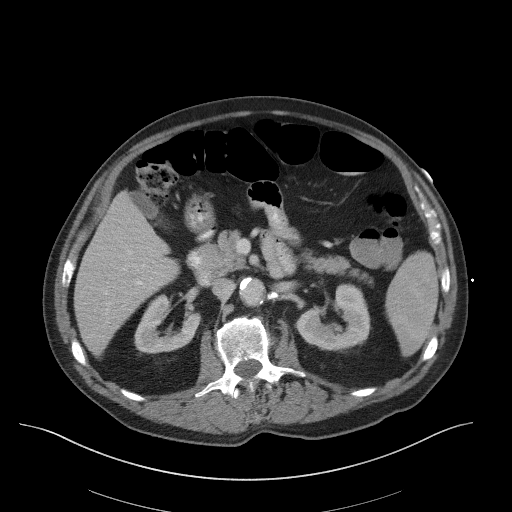
[im 77/97  soft-tissue]
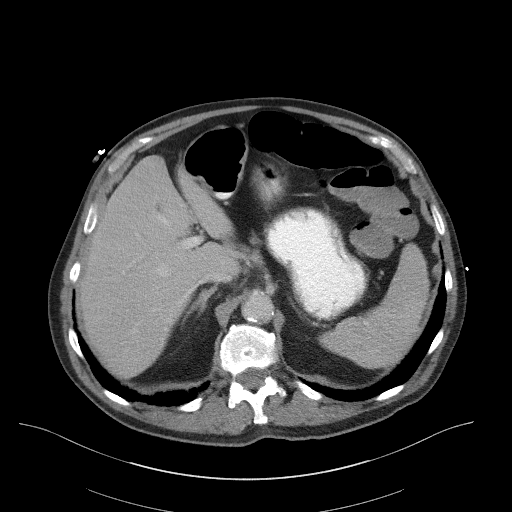
[im 82/97  soft-tissue]
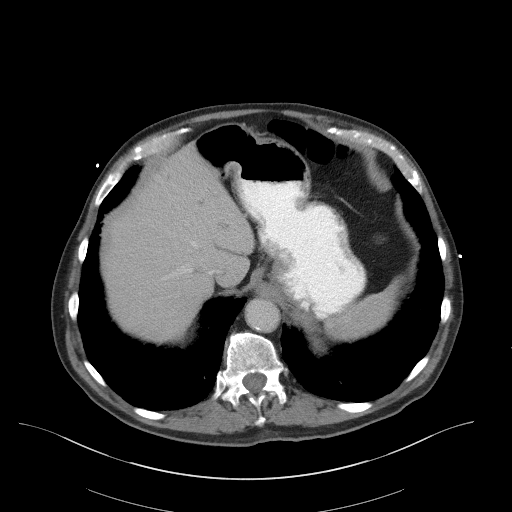
[im 92/97  soft-tissue]
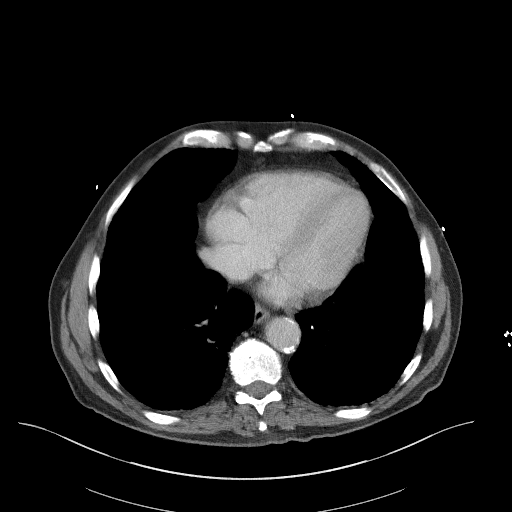

[Series 7: coronal st · coronal · 0.75mm/px · 3 of 103 slices shown]
[im 35/103  soft-tissue]
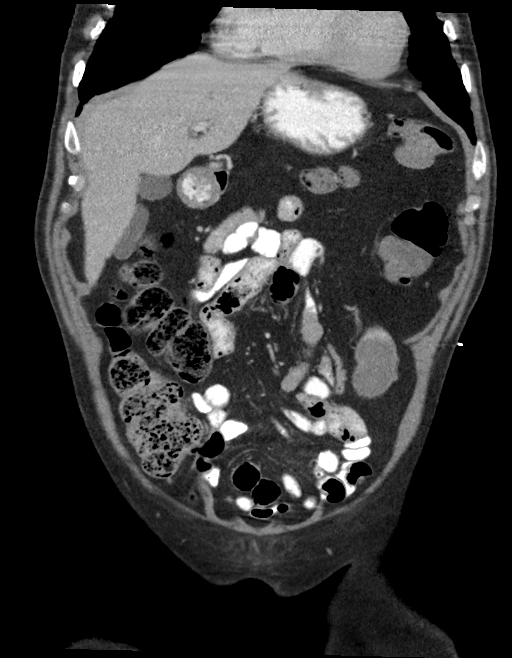
[im 46/103  soft-tissue]
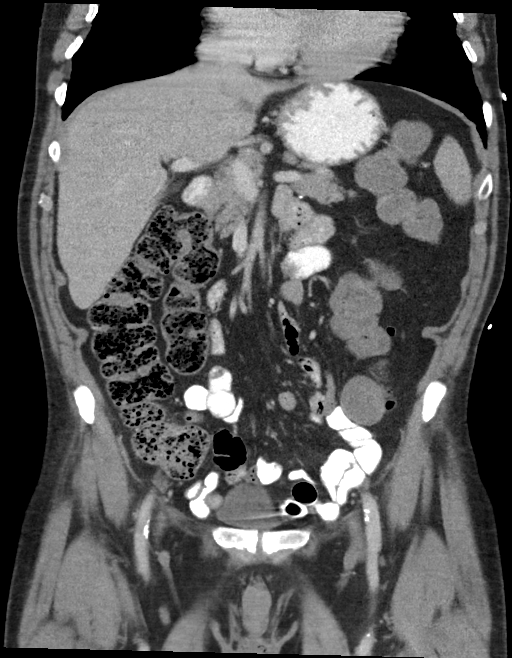
[im 57/103  soft-tissue]
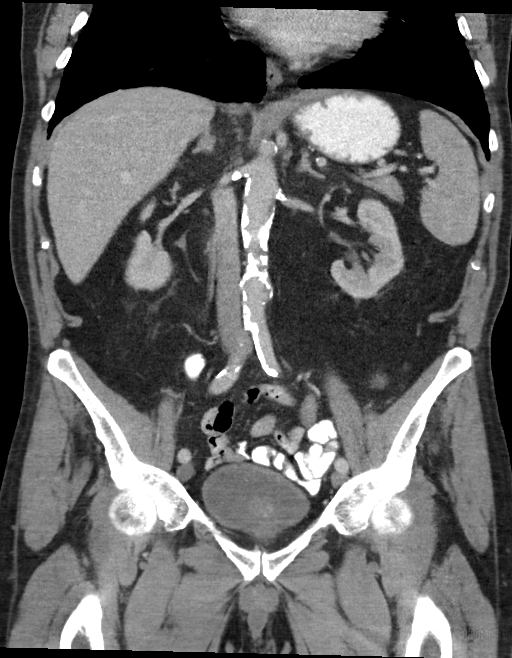

[16 of 46 positions shown; findings below may reference images not displayed]

FINDINGS: Lower chest: Atelectasis and fibrosis in the lung bases.

Hepatobiliary: Multiple subcentimeter low-attenuation lesions
throughout the liver are too small to characterize but likely
represent cysts or hemangiomas. Gallbladder and bile ducts are
unremarkable.

Pancreas: Unremarkable. No pancreatic ductal dilatation or
surrounding inflammatory changes.

Spleen: Normal in size without focal abnormality.

Adrenals/Urinary Tract: Adrenal glands are unremarkable. Kidneys are
normal, without renal calculi, focal lesion, or hydronephrosis.
Bladder is unremarkable.

Stomach/Bowel: Stomach, small bowel, and colon are not abnormally
distended. Scattered stool throughout the colon. Scattered colonic
diverticula. No evidence of diverticulitis. No focal bowel wall
thickening or inflammatory infiltration. Appendix is normal. Small
bilateral inguinal hernias containing small bowel but without
proximal obstruction.

Vascular/Lymphatic: Aortic atherosclerosis. No enlarged abdominal or
pelvic lymph nodes.

Reproductive: Prostate gland is enlarged, measuring 6.2 cm diameter.

Other: No free air or free fluid in the abdomen. Abdominal wall
musculature appears intact.

Musculoskeletal: Degenerative changes in the spine. No destructive
bone lesions.
IMPRESSION: 1. No evidence of bowel obstruction or inflammation. Diverticulosis
of the colon without evidence of diverticulitis.
2. Small bilateral inguinal hernias containing bowel but without
proximal obstruction.
3. Subcentimeter low-attenuation lesions in the liver are too small
to characterize but likely represent cysts or hemangiomas.
4. Prostate gland is enlarged.
5. Aortic atherosclerosis.

## 2018-08-21 ENCOUNTER — Telehealth: Payer: Self-pay | Admitting: Urology

## 2018-08-21 NOTE — Telephone Encounter (Signed)
Patient called and left a voicemail asking for a return call. Did not say what he needed. I called him back and had to leave him a voicemail. Asked him to call the clinic back and speak with anyone in the office.  Sharyn Lull

## 2018-09-17 ENCOUNTER — Ambulatory Visit (INDEPENDENT_AMBULATORY_CARE_PROVIDER_SITE_OTHER): Payer: Medicare Other | Admitting: Urology

## 2018-09-17 ENCOUNTER — Other Ambulatory Visit: Payer: Self-pay

## 2018-09-17 ENCOUNTER — Encounter: Payer: Self-pay | Admitting: Urology

## 2018-09-17 VITALS — BP 171/95 | HR 67 | Wt 183.0 lb

## 2018-09-17 DIAGNOSIS — N401 Enlarged prostate with lower urinary tract symptoms: Secondary | ICD-10-CM

## 2018-09-17 LAB — BLADDER SCAN AMB NON-IMAGING

## 2018-09-17 NOTE — Progress Notes (Signed)
09/17/2018 11:03 AM   Michael Gilbert [birth year] [id number]  Referring provider: Derinda Late, MD [phone] [address] [place] [place] [facility] [place], [place] [address]  Chief Complaint  Patient presents with  . Benign Prostatic Hypertrophy    HPI: 84 year old male presents for follow-up of BPH with incomplete bladder emptying.  His PVRs have ranged from 50-250 mL.  He feels his voiding pattern is stable.  He is on tamsulosin 0.8 mg daily.  He does have sensation of incomplete emptying approximately 50% of the time.  He notes an intermittent urinary stream, weak stream, urgency and nocturia x1.  Denies dysuria or gross hematuria.  Denies flank, abdominal, pelvic or scrotal pain.  IPSS was 21/35 with a quality of life rated 3/6   PMH: Past Medical History:  Diagnosis Date  . Allergy   . Bradycardia   . Colorectal polyps   . Diverticulosis   . GERD (gastroesophageal reflux disease)   . Hypertension     Surgical History: Past Surgical History:  Procedure Laterality Date  . CATARACT EXTRACTION    . COLONOSCOPY WITH PROPOFOL N/A 12/06/2017   Procedure: COLONOSCOPY WITH PROPOFOL;  Surgeon: Lin Landsman, MD;  Location[facility] ENDOSCOPY;  Service: Gastroenterology;  Laterality: N/A;  . ESOPHAGOGASTRODUODENOSCOPY (EGD) WITH PROPOFOL N/A 01/01/2018   Procedure: ESOPHAGOGASTRODUODENOSCOPY (EGD) WITH PROPOFOL;  Surgeon: Lin Landsman, MD;  Location: [facility] ENDOSCOPY;  Service: Gastroenterology;  Laterality: N/A;  . TONSILECTOMY, ADENOIDECTOMY, BILATERAL MYRINGOTOMY AND TUBES      Home Medications:  Allergies as of 09/17/2018      Reactions   Ace Inhibitors Cough   Lovastatin Other (See Comments)   Other reaction(s): Muscle Pain   Simvastatin    Other reaction(s): Muscle Pain      Medication List        Accurate as of 09/17/18 11:03 AM. Always use your most recent med list.          Acetaminophen 500 MG coapsule Take 5,002 mg by mouth every  6 (six) hours as needed.   ferrous sulfate 325 (65 FE) MG tablet Take 1 tablet (325 mg total) by mouth 2 (two) times daily with a meal.   fluticasone 50 MCG/ACT nasal spray Commonly known as:  FLONASE Place 1 spray into both nostrils daily as needed.   losartan 50 MG tablet Commonly known as:  COZAAR Take 50 mg by mouth daily.   MULTI-VITAMINS Tabs Take 1 tablet by mouth daily.   omeprazole 20 MG capsule Commonly known as:  PRILOSEC   tamsulosin 0.4 MG Caps capsule Commonly known as:  FLOMAX Take 2 capsules (0.8 mg total) by mouth daily.       Allergies:  Allergies  Allergen Reactions  . Ace Inhibitors Cough  . Lovastatin Other (See Comments)    Other reaction(s): Muscle Pain  . Simvastatin     Other reaction(s): Muscle Pain    Family History: Family History  Problem Relation Age of Onset  . Diabetes Father   . Diabetes Brother   . Bladder Cancer Neg Hx   . Kidney cancer Neg Hx   . Prostate cancer Neg Hx     Social History:  reports that he has quit smoking. He has never used smokeless tobacco. He reports that he drinks about 1.0 standard drinks of alcohol per week. He reports that he does not use drugs.  ROS: Refer to the patient intake for a complete review of systems  Physical Exam: BP Marland Kitchen)  171/95   Pulse 67   Wt 183 lb (83 kg)   BMI 24.82 kg/m   Constitutional:  Alert and oriented, No acute distress. HEENT: [place] AT, moist mucus membranes.  Trachea midline, no masses. Cardiovascular: No clubbing, cyanosis, or edema. Respiratory: Normal respiratory effort, no increased work of breathing. GI: Abdomen is soft, nontender, nondistended, no abdominal masses GU: No CVA tenderness Lymph: No cervical or inguinal lymphadenopathy. Skin: No rashes, bruises or suspicious lesions. Neurologic: Grossly intact, no focal deficits, moving all 4 extremities. Psychiatric: Normal mood and affect.   Assessment & Plan:   84 year old male with BPH and incomplete bladder  emptying/lower urinary tract symptoms.  PVR by bladder scan today was 260 mL.  I discussed additional management options including outlet procedures both TURP, PVP and minimally invasive options including UroLift.  Further medical management was discussed with addition of a 5-ARI however he was informed it would take 3 to 6 months to determine efficacy of this medication class.  He does have some interested in UroLift and was given a brochure.  If he desires to proceed will need to schedule cystoscopy and TRUS prostate.  He would like to think over these options and indicated he would call back with his decision.   Abbie Sons, [place] [address], [place], [place] [address] [phone]

## 2018-10-05 ENCOUNTER — Other Ambulatory Visit: Payer: Self-pay | Admitting: Family Medicine

## 2018-10-05 MED ORDER — TAMSULOSIN HCL 0.4 MG PO CAPS: 0.8000 mg | ORAL_CAPSULE | Freq: Every day | ORAL | 6 refills | Status: DC

## 2019-04-26 ENCOUNTER — Ambulatory Visit: Payer: Medicare Other | Admitting: Urology

## 2019-04-27 ENCOUNTER — Ambulatory Visit (INDEPENDENT_AMBULATORY_CARE_PROVIDER_SITE_OTHER): Payer: Medicare Other | Admitting: Urology

## 2019-04-27 ENCOUNTER — Encounter: Payer: Self-pay | Admitting: Urology

## 2019-04-27 ENCOUNTER — Other Ambulatory Visit: Payer: Self-pay

## 2019-04-27 VITALS — BP 167/78 | HR 72 | Ht 72.0 in | Wt 183.0 lb

## 2019-04-27 DIAGNOSIS — N401 Enlarged prostate with lower urinary tract symptoms: Secondary | ICD-10-CM | POA: Insufficient documentation

## 2019-04-27 DIAGNOSIS — R350 Frequency of micturition: Secondary | ICD-10-CM | POA: Diagnosis not present

## 2019-04-27 NOTE — Progress Notes (Signed)
04/27/2019 2:20 PM   Denese Killings 1928-01-31 [id number]  Referring provider: Derinda Late, MD [phone] [address] [place] [facility] [place],  [place] [address]  Chief Complaint  Patient presents with  . Benign Prostatic Hypertrophy    Follow up    HPI: 85 year old male presents for follow-up of BPH with lower urinary tract symptoms.  At our visit last November we discussed surgical management options in addition to 5-ARI medication.  He remains on tamsulosin 0.8 mg daily.  His biggest complaint is urinary frequency.  He estimates he goes 10-11 times per day with voided volumes ranging from 2-6 ounces.  His nocturia has resolved.  Denies dysuria, gross hematuria or flank/abdominal/pelvic/scrotal pain.    PMH: Past Medical History:  Diagnosis Date  . Allergy   . Bradycardia   . Colorectal polyps   . Diverticulosis   . GERD (gastroesophageal reflux disease)   . Hypertension     Surgical History: Past Surgical History:  Procedure Laterality Date  . CATARACT EXTRACTION    . COLONOSCOPY WITH PROPOFOL N/A 12/06/2017   Procedure: COLONOSCOPY WITH PROPOFOL;  Surgeon: Lin Landsman, MD;  Location: [facility] ENDOSCOPY;  Service: Gastroenterology;  Laterality: N/A;  . ESOPHAGOGASTRODUODENOSCOPY (EGD) WITH PROPOFOL N/A 01/01/2018   Procedure: ESOPHAGOGASTRODUODENOSCOPY (EGD) WITH PROPOFOL;  Surgeon: Lin Landsman, MD;  Location: [facility] ENDOSCOPY;  Service: Gastroenterology;  Laterality: N/A;  . TONSILECTOMY, ADENOIDECTOMY, BILATERAL MYRINGOTOMY AND TUBES      Home Medications:  Allergies as of 04/27/2019      Reactions   Ace Inhibitors Cough   Lovastatin Other (See Comments)   Other reaction(s): Muscle Pain   Simvastatin    Other reaction(s): Muscle Pain      Medication List       Accurate as of April 27, 2019  2:20 PM. If you have any questions, ask your nurse or doctor.        STOP taking these medications   Acetaminophen 500 MG  coapsule Stopped by: Abbie Sons, MD   ferrous sulfate 325 (65 FE) MG tablet Stopped by: Abbie Sons, MD     TAKE these medications   fluticasone 50 MCG/ACT nasal spray Commonly known as: FLONASE Place 1 spray into both nostrils daily as needed.   losartan 25 MG tablet Commonly known as: COZAAR What changed: Another medication with the same name was removed. Continue taking this medication, and follow the directions you see here. Changed by: Abbie Sons, MD   Multi-Vitamins Tabs Take 1 tablet by mouth daily.   omeprazole 20 MG capsule Commonly known as: PRILOSEC   tamsulosin 0.4 MG Caps capsule Commonly known as: FLOMAX Take 2 capsules (0.8 mg total) by mouth daily.       Allergies:  Allergies  Allergen Reactions  . Ace Inhibitors Cough  . Lovastatin Other (See Comments)    Other reaction(s): Muscle Pain  . Simvastatin     Other reaction(s): Muscle Pain    Family History: Family History  Problem Relation Age of Onset  . Diabetes Father   . Diabetes Brother   . Bladder Cancer Neg Hx   . Kidney cancer Neg Hx   . Prostate cancer Neg Hx     Social History:  reports that he has quit smoking. He has never used smokeless tobacco. He reports current alcohol use of about 1.0 standard drinks of alcohol per week. He reports that he does not use drugs.  ROS: UROLOGY Frequent Urination?:  Yes Hard to postpone urination?: Yes Burning/pain with urination?: No Get up at night to urinate?: No Leakage of urine?: Yes Urine stream starts and stops?: Yes Trouble starting stream?: No Do you have to strain to urinate?: No Blood in urine?: No Urinary tract infection?: No Sexually transmitted disease?: No Injury to kidneys or bladder?: No Painful intercourse?: No Weak stream?: No Erection problems?: No Penile pain?: No  Gastrointestinal Nausea?: No Vomiting?: No Indigestion/heartburn?: No Diarrhea?: No Constipation?: No  Constitutional Fever: No Night  sweats?: No Weight loss?: No Fatigue?: No  Skin Skin rash/lesions?: No Itching?: No  Eyes Blurred vision?: No Double vision?: No  Ears/Nose/Throat Sore throat?: No Sinus problems?: No  Hematologic/Lymphatic Swollen glands?: No Easy bruising?: No  Cardiovascular Leg swelling?: No Chest pain?: No  Respiratory Cough?: No Shortness of breath?: No  Endocrine Excessive thirst?: No  Musculoskeletal Back pain?: No Joint pain?: No  Neurological Headaches?: No Dizziness?: No  Psychologic Depression?: No Anxiety?: No  Physical Exam: BP (!) 167/78   Pulse 72   Ht 6' (1.829 m)   Wt 183 lb (83 kg)   BMI 24.82 kg/m   Constitutional:  Alert and oriented, No acute distress. HEENT: [place] AT, moist mucus membranes.  Trachea midline, no masses. Cardiovascular: No clubbing, cyanosis, or edema. Respiratory: Normal respiratory effort, no increased work of breathing. Skin: No rashes, bruises or suspicious lesions. Neurologic: Grossly intact, no focal deficits, moving all 4 extremities. Psychiatric: Normal mood and affect.   Assessment & Plan:   85 year old male with BPH and bothersome storage related voiding symptoms.  PVR by bladder scan today was 76 mL.  He is not interested in surgical treatment options at this time.  He was also given samples of Myrbetriq 50 mg  (Lot: [id number] exp: 04/2021) to try for his storage related voiding symptoms and will call back in 1 month regarding efficacy.  If not effective will add finasteride 5 mg daily.  Follow-up 6 months with PVR.   Abbie Sons, [place] [address], [place], [place] [address] [phone]

## 2019-06-17 ENCOUNTER — Ambulatory Visit (INDEPENDENT_AMBULATORY_CARE_PROVIDER_SITE_OTHER): Payer: Medicare Other | Admitting: Gastroenterology

## 2019-06-17 ENCOUNTER — Encounter: Payer: Self-pay | Admitting: Gastroenterology

## 2019-06-17 ENCOUNTER — Other Ambulatory Visit: Payer: Self-pay

## 2019-06-17 ENCOUNTER — Encounter (INDEPENDENT_AMBULATORY_CARE_PROVIDER_SITE_OTHER): Payer: Self-pay

## 2019-06-17 VITALS — BP 135/73 | HR 60 | Temp 97.8°F | Resp 16 | Ht 72.0 in | Wt 181.2 lb

## 2019-06-17 DIAGNOSIS — R1310 Dysphagia, unspecified: Secondary | ICD-10-CM

## 2019-06-17 NOTE — Progress Notes (Signed)
[name], MD [address]  [place]  [place], [place] [address]  Main: [phone]  Fax: [phone]    Gastroenterology Consultation  Referring Provider:     Beverly Gust, MD Primary Care Physician:  Derinda Late, MD Primary Gastroenterologist:  Dr. [name] Reason for Consultation: Dysphagia, reflux        HPI:   Michael Gilbert is a 85 y.o. male referred by Dr. Derinda Late, MD  for consultation & management of rectal bleeding. He is here at [facility] follow-up. Hospital summary: he was recently admitted to [facility] on 12/05/2017 secondary to severe symptomatic anemia resulting in hemorrhagic shock, from hematochezia.he was taking aspirin 81 mg twice a day. His hemoglobin dropped to 7.5 and underwent blood transfusion. He initially underwent bleeding scan which was suspicious for bleeding in sigmoid colon versus small bowel. Subsequently, underwent colonoscopy which revealed severe sigmoid diverticulosis and large nonbleeding internal hemorrhoids. There was no evidence of active bleeding. His hemoglobin on discharge was 8.4  Follow-up visit 12/16/2017: He denies any further episodes of bleeding per rectum since discharge. He is taking aspirin 81 mgdaily, oral iron 325 mg twice daily and Prilosec 40 mg every other day.he also reports that he has been experiencing several years history of intermittent episodes of severe gurgling/rumbling, gas associated with explosive, nonbloody loose stools or sometimes mucus which last about a week. These episodes occur every 2 months or so. He has normal bowel movements and does not have any other symptoms in between these episodes. He has been taking align once a day. He did not find any correlation to the foods. He denies being lactose intolerant. He also reports long-term history of reflux which is fairly under control currently on Prilosec every other day. He denies having an upper endoscopy in the  past.   Follow-up visit 02/11/2018 Patient has been feeling lethargic lately. He tried VSL# 3 for chronic diarrhea with no benefit He could not afford rifaximin as a co-pay was $1600 He continues to have alternating episodes of diarrhea and constipation associated with abdominal bloating and passing mucus. he denies melena, rectal bleeding He had CBC checked by his primary care doctor about 4 weeks ago and hemoglobin continues to improve He had H. Pylori IgG positive, underwent EGD with gastric biopsies which did not reveal evidence of H. Pylori  Follow-up visit 02/19/2018 Patient is here to discuss about the lab results. His ferritin levels are low but hemoglobin has nicely improved. He is currently taking oral iron 2-3 pills daily. He had normal B12 levels. He continues to experience sporadic episodes of nonbloody watery bowel movements. He has normal formed bowel movement daily in between these episodes. The symptoms have been ongoing for several years. TTG IgA serologies came back negative. I did not perform random colon biopsies during his last colonoscopy as it was done in the setting of diverticular bleed as inpatient. He does not have any constitutional symptoms. He is taking probiotics daily, cannot remember the name of the brand  Follow-up visit 04/17/2018 He reports that his symptoms are currently under control and did not have any last 1 month. He is taking Imodium when diarrhea recurs, helps by not having a BM for 2 days then becomes normal. He is taking IB guard 2 capsules with each meal daily and seems to help with his IBS symptoms. He stopped probiotics. He recently had labs done performed by his PCP. Hemoglobin 12.8, normal MCV. He is taking oral  iron one pill daily He continues to play golf  Follow-up visit 06/17/2019 Patient is referred by [name] to evaluate for possible dysphagia.  Patient reports that he has been experiencing occasional episodes of liquids getting stuck in  his throat and some discomfort.  He hears some gurgling in his throat and eventually the liquids go down.  This occurs every 2 to 3 weeks only, he denies difficulty swallowing to solid food.   [name] started him on omeprazole 20 mg twice a day about 10 days ago.  He denies weight loss, regurgitation, heartburn.  His EGD in 2019 revealed small hiatal hernia only.  He is otherwise doing very well, continues to play golf  NSAIDs: none  Antiplts/Anticoagulants/Anti thrombotics: aspirin 81 mg  GI Procedures: EGD 01/01/2018 - Normal duodenal bulb and second portion of the duodenum. - Normal stomach. Biopsied. - Small hiatal hernia. - Normal gastroesophageal junction and esophagus.  DIAGNOSIS:  A. RANDOM STOMACH; COLD BIOPSY:  - OXYNTIC MUCOSA WITH MILD CHRONIC GASTRITIS AND PROTON PUMP INHIBITOR  EFFECT.  - NEGATIVE FOR DYSPLASIA AND MALIGNANCY.   Note: Positive H. pylori antibody is noted. Serologic testing is no  longer recommended since the H. pylori stool antigen and urea breath  tests can be used to both diagnose and monitor response to therapy for  H. pylori infection. Immunohistochemistry may not be helpful in this  case due to low organism burden from use of proton pump inhibitors.  Organisms are not seen on routine HE stained sections.  Colonoscopy 12/06/17 Findings: - Two 4 to 5 mm polyps in the transverse colon, removed with a cold snare. Resected and retrieved. - Diverticulosis in the sigmoid colon. - No evidence of active bleeding - He probably had divertucular bleed in sigmoid colon based on bleeding scan secondary to chronic use of aspirin 81mg  BID - The examined portion of the ileum was normal. - Non-bleeding internal hemorrhoids. - The examination was otherwise normal.  DIAGNOSIS:  A. COLON POLYP, TRANSVERSE; COLD SNARE:  - TUBULAR ADENOMA, 2 FRAGMENTS.  - NEGATIVE FOR HIGH-GRADE DYSPLASIA AND MALIGNANCY.  Past Medical History:  Diagnosis Date   Allergy      Bradycardia    Colorectal polyps    Diverticulosis    GERD (gastroesophageal reflux disease)    Hypertension     Past Surgical History:  Procedure Laterality Date   CATARACT EXTRACTION     COLONOSCOPY WITH PROPOFOL N/A 12/06/2017   Procedure: COLONOSCOPY WITH PROPOFOL;  Surgeon: Lin Landsman, MD;  Location: [facility] ENDOSCOPY;  Service: Gastroenterology;  Laterality: N/A;   ESOPHAGOGASTRODUODENOSCOPY (EGD) WITH PROPOFOL N/A 01/01/2018   Procedure: ESOPHAGOGASTRODUODENOSCOPY (EGD) WITH PROPOFOL;  Surgeon: Lin Landsman, MD;  Location: [facility] ENDOSCOPY;  Service: Gastroenterology;  Laterality: N/A;   TONSILECTOMY, ADENOIDECTOMY, BILATERAL MYRINGOTOMY AND TUBES       Current Outpatient Medications:    fluticasone (FLONASE) 50 MCG/ACT nasal spray, Place 1 spray into both nostrils daily as needed. , Disp: , Rfl: 10   losartan (COZAAR) 25 MG tablet, , Disp: , Rfl:    Multiple Vitamin (MULTI-VITAMINS) TABS, Take 1 tablet by mouth daily. , Disp: , Rfl:    omeprazole (PRILOSEC) 20 MG capsule, , Disp: , Rfl: 2   tamsulosin (FLOMAX) 0.4 MG CAPS capsule, Take 2 capsules (0.8 mg total) by mouth daily., Disp: 60 capsule, Rfl: 6   Family History  Problem Relation Age of Onset   Diabetes Father    Diabetes Brother    Bladder Cancer Neg Hx  Kidney cancer Neg Hx    Prostate cancer Neg Hx      Social History   Tobacco Use   Smoking status: Former Smoker   Smokeless tobacco: Never Used  Substance Use Topics   Alcohol use: Yes    Alcohol/week: 1.0 standard drinks    Types: 1 Glasses of wine per week    Comment: 1 glass of wine daily   Drug use: No    Allergies as of 06/17/2019 - Review Complete 06/17/2019  Allergen Reaction Noted   Ace inhibitors Cough 06/03/2014   Lovastatin Other (See Comments) 06/03/2014   Simvastatin  06/03/2014    Review of Systems:    All systems reviewed and negative except where noted in HPI.   Physical Exam:  BP 135/73  (BP Location: Left Arm, Patient Position: Sitting, Cuff Size: Normal)    Pulse 60    Temp 97.8 F (36.6 C)    Resp 16    Ht 6' (1.829 m)    Wt 181 lb 3.2 oz (82.2 kg)    BMI 24.58 kg/m  No LMP for male patient.  General:   Alert,  Well-developed, well-nourished, pleasant and cooperative in NAD Head:  Normocephalic and atraumatic. Eyes:  Sclera clear, no icterus.   Conjunctiva pink. Ears:  Normal auditory acuity. Nose:  No deformity, discharge, or lesions. Mouth:  No deformity or lesions,oropharynx pink & moist. Neck:  Supple; no masses or thyromegaly. Lungs:  Respirations even and unlabored.  Clear throughout to auscultation.   No wheezes, crackles, or rhonchi. No acute distress. Heart:  Regular rate and rhythm; no murmurs, clicks, rubs, or gallops. Abdomen:  Normal bowel sounds. Soft, non-tender and non-distended without masses, hepatosplenomegaly or hernias noted.  No guarding or rebound tenderness.   Rectal: Not performed Msk:  Symmetrical without gross deformities. Good, equal movement & strength bilaterally. Pulses:  Normal pulses noted. Extremities:  No clubbing or edema.  No cyanosis. Neurologic:  Alert and oriented x3;  grossly normal neurologically. Skin:  Intact without significant lesions or rashes. No jaundice. Psych:  Alert and cooperative. Normal mood and affect.  Imaging Studies: reviewed  Assessment and Plan:   Michael Gilbert is a 85 y.o. caucasian male with history of diverticular bleed referred by [name] for evaluation of possible dysphagia.  Dysphagia to liquids is episodic.  Could be presbyesophagus or esophageal dysmotility and unlikely a mechanical cause because he does not have dysphagia to solids.  Suggested him to continue omeprazole 20 mg twice daily for 2 months.  If his symptoms are worse, will perform EGD followed by +/-  esophageal manometry  Follow up in 2 months   [name], MD

## 2019-08-17 ENCOUNTER — Encounter (INDEPENDENT_AMBULATORY_CARE_PROVIDER_SITE_OTHER): Payer: Self-pay

## 2019-08-17 ENCOUNTER — Encounter: Payer: Self-pay | Admitting: Gastroenterology

## 2019-08-17 ENCOUNTER — Other Ambulatory Visit: Payer: Self-pay

## 2019-08-17 ENCOUNTER — Ambulatory Visit (INDEPENDENT_AMBULATORY_CARE_PROVIDER_SITE_OTHER): Payer: Medicare Other | Admitting: Gastroenterology

## 2019-08-17 VITALS — BP 142/78 | HR 61 | Temp 97.7°F | Resp 16 | Ht 72.0 in | Wt 179.8 lb

## 2019-08-17 DIAGNOSIS — R1310 Dysphagia, unspecified: Secondary | ICD-10-CM

## 2019-08-17 NOTE — Progress Notes (Signed)
Michael Darby, MD [address]  [place]  [place], [place] [address]  Main: [phone]  Fax: [phone]    Gastroenterology Consultation  Referring Provider:     Derinda Late, MD Primary Care Physician:  Derinda Late, MD Primary Gastroenterologist:  Dr. Cephas Gilbert Reason for Consultation: Dysphagia, reflux        HPI:   Michael Gilbert is a 85 y.o. male referred by Dr. Derinda Late, MD  for consultation & management of rectal bleeding. He is here at [facility] follow-up. Hospital summary: he was recently admitted to [facility] on 12/05/2017 secondary to severe symptomatic anemia resulting in hemorrhagic shock, from hematochezia.he was taking aspirin 81 mg twice a day. His hemoglobin dropped to 7.5 and underwent blood transfusion. He initially underwent bleeding scan which was suspicious for bleeding in sigmoid colon versus small bowel. Subsequently, underwent colonoscopy which revealed severe sigmoid diverticulosis and large nonbleeding internal hemorrhoids. There was no evidence of active bleeding. His hemoglobin on discharge was 8.4  Follow-up visit 12/16/2017: He denies any further episodes of bleeding per rectum since discharge. He is taking aspirin 81 mgdaily, oral iron 325 mg twice daily and Prilosec 40 mg every other day.he also reports that he has been experiencing several years history of intermittent episodes of severe gurgling/rumbling, gas associated with explosive, nonbloody loose stools or sometimes mucus which last about a week. These episodes occur every 2 months or so. He has normal bowel movements and does not have any other symptoms in between these episodes. He has been taking align once a day. He did not find any correlation to the foods. He denies being lactose intolerant. He also reports long-term history of reflux which is fairly under control currently on Prilosec every other day. He denies having an upper endoscopy in the  past.   Follow-up visit 02/11/2018 Patient has been feeling lethargic lately. He tried VSL# 3 for chronic diarrhea with no benefit He could not afford rifaximin as a co-pay was $1600 He continues to have alternating episodes of diarrhea and constipation associated with abdominal bloating and passing mucus. he denies melena, rectal bleeding He had CBC checked by his primary care doctor about 4 weeks ago and hemoglobin continues to improve He had H. Pylori IgG positive, underwent EGD with gastric biopsies which did not reveal evidence of H. Pylori  Follow-up visit 02/19/2018 Patient is here to discuss about the lab results. His ferritin levels are low but hemoglobin has nicely improved. He is currently taking oral iron 2-3 pills daily. He had normal B12 levels. He continues to experience sporadic episodes of nonbloody watery bowel movements. He has normal formed bowel movement daily in between these episodes. The symptoms have been ongoing for several years. TTG IgA serologies came back negative. I did not perform random colon biopsies during his last colonoscopy as it was done in the setting of diverticular bleed as inpatient. He does not have any constitutional symptoms. He is taking probiotics daily, cannot remember the name of the brand  Follow-up visit 04/17/2018 He reports that his symptoms are currently under control and did not have any last 1 month. He is taking Imodium when diarrhea recurs, helps by not having a BM for 2 days then becomes normal. He is taking IB guard 2 capsules with each meal daily and seems to help with his IBS symptoms. He stopped probiotics. He recently had labs done performed by his PCP. Hemoglobin 12.8, normal MCV. He is taking oral  iron one pill daily He continues to play golf  Follow-up visit 06/17/2019 Patient is referred by [name] to evaluate for possible dysphagia.  Patient reports that he has been experiencing occasional episodes of liquids getting stuck in  his throat and some discomfort.  He hears some gurgling in his throat and eventually the liquids go down.  This occurs every 2 to 3 weeks only, he denies difficulty swallowing to solid food.   [name] started him on omeprazole 20 mg twice a day about 10 days ago.  He denies weight loss, regurgitation, heartburn.  His EGD in 2019 revealed small hiatal hernia only.  He is otherwise doing very well, continues to play golf  Follow-up visit 08/17/2019 Patient reports doing well on omeprazole 20 mg daily.  He reports having had 2 episodes since last visit.  He denies any other concerns today.  NSAIDs: none  Antiplts/Anticoagulants/Anti thrombotics: aspirin 81 mg  GI Procedures: EGD 01/01/2018 - Normal duodenal bulb and second portion of the duodenum. - Normal stomach. Biopsied. - Small hiatal hernia. - Normal gastroesophageal junction and esophagus.  DIAGNOSIS:  A. RANDOM STOMACH; COLD BIOPSY:  - OXYNTIC MUCOSA WITH MILD CHRONIC GASTRITIS AND PROTON PUMP INHIBITOR  EFFECT.  - NEGATIVE FOR DYSPLASIA AND MALIGNANCY.   Note: Positive H. pylori antibody is noted. Serologic testing is no  longer recommended since the H. pylori stool antigen and urea breath  tests can be used to both diagnose and monitor response to therapy for  H. pylori infection. Immunohistochemistry may not be helpful in this  case due to low organism burden from use of proton pump inhibitors.  Organisms are not seen on routine HE stained sections.  Colonoscopy 12/06/17 Findings: - Two 4 to 5 mm polyps in the transverse colon, removed with a cold snare. Resected and retrieved. - Diverticulosis in the sigmoid colon. - No evidence of active bleeding - He probably had divertucular bleed in sigmoid colon based on bleeding scan secondary to chronic use of aspirin 81mg  BID - The examined portion of the ileum was normal. - Non-bleeding internal hemorrhoids. - The examination was otherwise normal.  DIAGNOSIS:  A. COLON  POLYP, TRANSVERSE; COLD SNARE:  - TUBULAR ADENOMA, 2 FRAGMENTS.  - NEGATIVE FOR HIGH-GRADE DYSPLASIA AND MALIGNANCY.  Past Medical History:  Diagnosis Date  . Allergy   . Bradycardia   . Colorectal polyps   . Diverticulosis   . GERD (gastroesophageal reflux disease)   . Hypertension     Past Surgical History:  Procedure Laterality Date  . CATARACT EXTRACTION    . COLONOSCOPY WITH PROPOFOL N/A 12/06/2017   Procedure: COLONOSCOPY WITH PROPOFOL;  Surgeon: Lin Landsman, MD;  Location: [facility] ENDOSCOPY;  Service: Gastroenterology;  Laterality: N/A;  . ESOPHAGOGASTRODUODENOSCOPY (EGD) WITH PROPOFOL N/A 01/01/2018   Procedure: ESOPHAGOGASTRODUODENOSCOPY (EGD) WITH PROPOFOL;  Surgeon: Lin Landsman, MD;  Location: [facility] ENDOSCOPY;  Service: Gastroenterology;  Laterality: N/A;  . TONSILECTOMY, ADENOIDECTOMY, BILATERAL MYRINGOTOMY AND TUBES       Current Outpatient Medications:  .  fluticasone (FLONASE) 50 MCG/ACT nasal spray, Place 1 spray into both nostrils daily as needed. , Disp: , Rfl: 10 .  losartan (COZAAR) 25 MG tablet, , Disp: , Rfl:  .  Multiple Vitamin (MULTI-VITAMINS) TABS, Take 1 tablet by mouth daily. , Disp: , Rfl:  .  mupirocin ointment (BACTROBAN) 2 %, , Disp: , Rfl:  .  omeprazole (PRILOSEC) 20 MG capsule, , Disp: , Rfl: 2 .  tamsulosin (FLOMAX) 0.4 MG CAPS  capsule, Take 2 capsules (0.8 mg total) by mouth daily., Disp: 60 capsule, Rfl: 6   Family History  Problem Relation Age of Onset  . Diabetes Father   . Diabetes Brother   . Bladder Cancer Neg Hx   . Kidney cancer Neg Hx   . Prostate cancer Neg Hx      Social History   Tobacco Use  . Smoking status: Former Research scientist (life sciences)  . Smokeless tobacco: Never Used  Substance Use Topics  . Alcohol use: Yes    Alcohol/week: 1.0 standard drinks    Types: 1 Glasses of wine per week    Comment: 1 glass of wine daily  . Drug use: No    Allergies as of 08/17/2019 - Review Complete 08/17/2019  Allergen Reaction Noted   . Ace inhibitors Cough 06/03/2014  . Lovastatin Other (See Comments) 06/03/2014  . Simvastatin  06/03/2014    Review of Systems:    All systems reviewed and negative except where noted in HPI.   Physical Exam:  BP (!) 142/78 (BP Location: Left Arm, Patient Position: Sitting, Cuff Size: Large)   Pulse 61   Temp 97.7 F (36.5 C)   Resp 16   Ht 6' (1.829 m)   Wt 179 lb 12.8 oz (81.6 kg)   BMI 24.39 kg/m  No LMP for male patient.  General:   Alert,  Well-developed, well-nourished, pleasant and cooperative in NAD Head:  Normocephalic and atraumatic. Eyes:  Sclera clear, no icterus.   Conjunctiva pink. Ears:  Normal auditory acuity. Nose:  No deformity, discharge, or lesions. Mouth:  No deformity or lesions,oropharynx pink & moist. Neck:  Supple; no masses or thyromegaly. Lungs:  Respirations even and unlabored.  Clear throughout to auscultation.   No wheezes, crackles, or rhonchi. No acute distress. Heart:  Regular rate and rhythm; no murmurs, clicks, rubs, or gallops. Abdomen:  Normal bowel sounds. Soft, non-tender and non-distended without masses, hepatosplenomegaly or hernias noted.  No guarding or rebound tenderness.   Rectal: Not performed Msk:  Symmetrical without gross deformities. Good, equal movement & strength bilaterally. Pulses:  Normal pulses noted. Extremities:  No clubbing or edema.  No cyanosis. Neurologic:  Alert and oriented x3;  grossly normal neurologically. Skin:  Intact without significant lesions or rashes. No jaundice. Psych:  Alert and cooperative. Normal mood and affect.  Imaging Studies: reviewed  Assessment and Plan:   Michael Gilbert is a 85 y.o. caucasian male with history of diverticular bleed is here for follow-up of reflux/possible dysphagia.  Symptoms are well under control on omeprazole 20 mg twice daily.  He has been on this dose for 2 months, suggested him to decrease to once a day for 1 month.  If he continues to do well, he can stop and  switch to Pepcid over-the-counter as needed.  Cautioned him about rebound reflux symptoms after stopping PPI.  No further work-up is indicated at this time unless he develops recurrence of the symptoms after discontinuation of PPI   Follow up as needed   Michael Darby, MD

## 2019-08-17 NOTE — Patient Instructions (Signed)
Decrease omeprazole 20 mg to once daily for 4 weeks, then switch to Pepcid over the counter as needed for heartburn and reflux.

## 2019-09-02 ENCOUNTER — Encounter: Payer: Self-pay | Admitting: Urology

## 2019-09-02 ENCOUNTER — Ambulatory Visit (INDEPENDENT_AMBULATORY_CARE_PROVIDER_SITE_OTHER): Payer: Medicare Other | Admitting: Urology

## 2019-09-02 ENCOUNTER — Other Ambulatory Visit: Payer: Self-pay

## 2019-09-02 VITALS — BP 142/74 | HR 62 | Ht 72.0 in | Wt 180.0 lb

## 2019-09-02 DIAGNOSIS — R338 Other retention of urine: Secondary | ICD-10-CM

## 2019-09-02 DIAGNOSIS — R350 Frequency of micturition: Secondary | ICD-10-CM | POA: Diagnosis not present

## 2019-09-02 DIAGNOSIS — N401 Enlarged prostate with lower urinary tract symptoms: Secondary | ICD-10-CM

## 2019-09-02 LAB — BLADDER SCAN AMB NON-IMAGING

## 2019-09-02 NOTE — Progress Notes (Signed)
09/02/2019 2:58 PM   Michael Gilbert 23-Nov-1927 [id number]  Referring provider: Derinda Late, MD [phone] [facility] [place],  [place] [address]  Chief Complaint  Patient presents with  . Benign Prostatic Hypertrophy    HPI: 85 y.o. male with BPH and lower urinary tract symptoms with a previous history of urinary retention.  He was also having significant nocturia.  At his last visit June 2020 his nocturia had resolved and his main complaint was urinary frequency going 10-11 times per day.  He was given a trial of Myrbetriq which he took for 1 month and did not see any significant improvement.  He brought in a 24-hour voiding diary today and he voided 12 times from awakening to 10 PM.  He had no nocturia.  His urine volumes range between 2-5 ounces with an average of around 3 ounces.  Denies dysuria or gross hematuria.  No flank, abdominal or pelvic pain.  He wanted to know if he would be a potential candidate for UroLift.   PMH: Past Medical History:  Diagnosis Date  . Allergy   . Bradycardia   . Colorectal polyps   . Diverticulosis   . GERD (gastroesophageal reflux disease)   . Hypertension     Surgical History: Past Surgical History:  Procedure Laterality Date  . CATARACT EXTRACTION    . COLONOSCOPY WITH PROPOFOL N/A 12/06/2017   Procedure: COLONOSCOPY WITH PROPOFOL;  Surgeon: Lin Landsman, MD;  Location: [facility] ENDOSCOPY;  Service: Gastroenterology;  Laterality: N/A;  . ESOPHAGOGASTRODUODENOSCOPY (EGD) WITH PROPOFOL N/A 01/01/2018   Procedure: ESOPHAGOGASTRODUODENOSCOPY (EGD) WITH PROPOFOL;  Surgeon: Lin Landsman, MD;  Location: [facility] ENDOSCOPY;  Service: Gastroenterology;  Laterality: N/A;  . TONSILECTOMY, ADENOIDECTOMY, BILATERAL MYRINGOTOMY AND TUBES      Home Medications:  Allergies as of 09/02/2019      Reactions   Ace Inhibitors Cough   Lovastatin Other (See Comments)   Other reaction(s): Muscle Pain   Simvastatin    Other reaction(s): Muscle Pain      Medication List       Accurate as of September 02, 2019 11:59 PM. If you have any questions, ask your nurse or doctor.        fluorouracil 5 % cream Commonly known as: EFUDEX Apply to scal TWICE DAILY FOR 5-6 days   fluticasone 50 MCG/ACT nasal spray Commonly known as: FLONASE Place 1 spray into both nostrils daily as needed.   losartan 25 MG tablet Commonly known as: COZAAR   Multi-Vitamins Tabs Take 1 tablet by mouth daily.   mupirocin ointment 2 % Commonly known as: BACTROBAN   omeprazole 20 MG capsule Commonly known as: PRILOSEC   tamsulosin 0.4 MG Caps capsule Commonly known as: FLOMAX Take 2 capsules (0.8 mg total) by mouth daily.       Allergies:  Allergies  Allergen Reactions  . Ace Inhibitors Cough  . Lovastatin Other (See Comments)    Other reaction(s): Muscle Pain  . Simvastatin     Other reaction(s): Muscle Pain    Family History: Family History  Problem Relation Age of Onset  . Diabetes Father   . Diabetes Brother   . Bladder Cancer Neg Hx   . Kidney cancer Neg Hx   . Prostate cancer Neg Hx     Social History:  reports that he has quit smoking. He has never used smokeless tobacco. He reports current alcohol use of about 1.0 standard drinks of alcohol  per week. He reports that he does not use drugs.  ROS: No significant change from 04/27/2019  Physical Exam: BP (!) 142/74 (BP Location: Left Arm, Patient Position: Sitting, Cuff Size: Normal)   Pulse 62   Ht 6' (1.829 m)   Wt 180 lb (81.6 kg)   BMI 24.41 kg/m   Constitutional:  Alert and oriented, No acute distress. HEENT: [place] AT, moist mucus membranes.  Trachea midline, no masses. Cardiovascular: No clubbing, cyanosis, or edema. Respiratory: Normal respiratory effort, no increased work of breathing. Skin: No rashes, bruises or suspicious lesions. Neurologic: Grossly intact, no focal deficits, moving all 4 extremities. Psychiatric:  Normal mood and affect.    Assessment & Plan:    - BPH with urinary frequency PVR by bladder scan was 145 mL.  I did discuss UroLift and he would need cystoscopy/TRUS to see if he is a candidate.  He was undecided at this point.  He did want to try Myrbetriq a little bit longer and was given samples 50 mg.  He would like to try for 2 months.  If no improvement and he desires to pursue UroLift he will call back and otherwise have him follow-up in 6 months.   Abbie Sons, [place] [address] [place], [place] [address] [phone]

## 2019-09-04 ENCOUNTER — Encounter: Payer: Self-pay | Admitting: Urology

## 2019-11-02 ENCOUNTER — Ambulatory Visit: Payer: Medicare Other | Admitting: Urology

## 2019-11-03 ENCOUNTER — Other Ambulatory Visit: Payer: Self-pay

## 2019-11-03 ENCOUNTER — Telehealth: Payer: Self-pay | Admitting: Urology

## 2019-11-03 ENCOUNTER — Ambulatory Visit: Payer: Medicare Other | Admitting: Urology

## 2019-11-03 DIAGNOSIS — R350 Frequency of micturition: Secondary | ICD-10-CM

## 2019-11-03 MED ORDER — MIRABEGRON ER 50 MG PO TB24: 50.0000 mg | ORAL_TABLET | Freq: Every day | ORAL | 11 refills | Status: DC

## 2019-11-03 NOTE — Telephone Encounter (Signed)
Called pt informed him that RX was sent to [place] [facility], pt gave verbal understanding.

## 2019-11-03 NOTE — Telephone Encounter (Signed)
Pharmacy calls and states that they did not receive rx for myrbetriq. RX re sent.

## 2019-11-03 NOTE — Telephone Encounter (Signed)
Pt called and asked if he could get some samples of Myrbetriq 50 mg and also have an RX called in.

## 2019-11-04 ENCOUNTER — Ambulatory Visit: Payer: Medicare Other | Admitting: Urology

## 2019-11-29 ENCOUNTER — Ambulatory Visit: Payer: Medicare Other | Admitting: Urology

## 2019-12-10 DIAGNOSIS — I4892 Unspecified atrial flutter: Secondary | ICD-10-CM | POA: Diagnosis present

## 2019-12-28 ENCOUNTER — Other Ambulatory Visit: Payer: Self-pay | Admitting: Urology

## 2019-12-30 ENCOUNTER — Other Ambulatory Visit: Payer: Self-pay

## 2019-12-30 ENCOUNTER — Encounter: Payer: Self-pay | Admitting: Urology

## 2019-12-30 ENCOUNTER — Ambulatory Visit (INDEPENDENT_AMBULATORY_CARE_PROVIDER_SITE_OTHER): Payer: Medicare Other | Admitting: Urology

## 2019-12-30 VITALS — BP 173/69 | HR 47 | Ht 72.0 in | Wt 174.0 lb

## 2019-12-30 DIAGNOSIS — R350 Frequency of micturition: Secondary | ICD-10-CM

## 2019-12-30 DIAGNOSIS — N401 Enlarged prostate with lower urinary tract symptoms: Secondary | ICD-10-CM | POA: Diagnosis not present

## 2019-12-30 LAB — BLADDER SCAN AMB NON-IMAGING: Scan Result: 41

## 2019-12-30 NOTE — Progress Notes (Signed)
12/30/2019 9:28 AM   Denese Killings 1928/05/02 [id number]  Referring provider: Derinda Late, MD [phone] [facility] [place],  [place] [address]  Chief Complaint  Patient presents with  . Benign Prostatic Hypertrophy    HPI: 86 y.o. male presents for follow-up of BPH and lower urinary tract symptoms.  Prior history of urinary retention.  He was last seen late October 2020 and his primary complaint was daytime urinary frequency at 10-11 times per day.  No nocturia.  He had been on Myrbetriq and elected to try another trial which he took 1 month of samples and an additional prescription.  He states this did not help his daytime frequency and he did note slight elevation of his blood pressure.  He has discontinued this medication.  No dysuria or gross hematuria.  He had also asked about UroLift at her last visit which we discussed.   PMH: Past Medical History:  Diagnosis Date  . Allergy   . Bradycardia   . Colorectal polyps   . Diverticulosis   . GERD (gastroesophageal reflux disease)   . Hypertension     Surgical History: Past Surgical History:  Procedure Laterality Date  . CATARACT EXTRACTION    . COLONOSCOPY WITH PROPOFOL N/A 12/06/2017   Procedure: COLONOSCOPY WITH PROPOFOL;  Surgeon: Lin Landsman, MD;  Location: [facility] ENDOSCOPY;  Service: Gastroenterology;  Laterality: N/A;  . ESOPHAGOGASTRODUODENOSCOPY (EGD) WITH PROPOFOL N/A 01/01/2018   Procedure: ESOPHAGOGASTRODUODENOSCOPY (EGD) WITH PROPOFOL;  Surgeon: Lin Landsman, MD;  Location: [facility] ENDOSCOPY;  Service: Gastroenterology;  Laterality: N/A;  . TONSILECTOMY, ADENOIDECTOMY, BILATERAL MYRINGOTOMY AND TUBES      Home Medications:  Allergies as of 12/30/2019      Reactions   Ace Inhibitors Cough   Lovastatin Other (See Comments)   Other reaction(s): Muscle Pain   Simvastatin    Other reaction(s): Muscle Pain      Medication List       Accurate as of  December 30, 2019  9:28 AM. If you have any questions, ask your nurse or doctor.        Eliquis 2.5 MG Tabs tablet Generic drug: apixaban SMARTSIG:1 Tablet(s) By Mouth Every 12 Hours   fluorouracil 5 % cream Commonly known as: EFUDEX Apply to scal TWICE DAILY FOR 5-6 days   fluticasone 50 MCG/ACT nasal spray Commonly known as: FLONASE Place 1 spray into both nostrils daily as needed.   losartan 25 MG tablet Commonly known as: COZAAR   mirabegron ER 50 MG Tb24 tablet Commonly known as: MYRBETRIQ Take 1 tablet (50 mg total) by mouth daily.   Multi-Vitamins Tabs Take 1 tablet by mouth daily.   mupirocin ointment 2 % Commonly known as: BACTROBAN   omeprazole 20 MG capsule Commonly known as: PRILOSEC   tamsulosin 0.4 MG Caps capsule Commonly known as: FLOMAX TAKE 2 CAPSULES EVERY DAY       Allergies:  Allergies  Allergen Reactions  . Ace Inhibitors Cough  . Lovastatin Other (See Comments)    Other reaction(s): Muscle Pain  . Simvastatin     Other reaction(s): Muscle Pain    Family History: Family History  Problem Relation Age of Onset  . Diabetes Father   . Diabetes Brother   . Bladder Cancer Neg Hx   . Kidney cancer Neg Hx   . Prostate cancer Neg Hx     Social History:  reports that he has quit smoking. He has never used  smokeless tobacco. He reports current alcohol use of about 1.0 standard drinks of alcohol per week. He reports that he does not use drugs.   Physical Exam: BP (!) 173/69 (BP Location: Left Arm, Patient Position: Sitting, Cuff Size: Large)   Pulse (!) 47   Ht 6' (1.829 m)   Wt 174 lb (78.9 kg)   BMI 23.60 kg/m   Constitutional:  Alert and oriented, No acute distress.    Assessment & Plan:    - Benign prostatic hyperplasia with urinary frequency PVR by bladder scan today was significantly improved at 41 mL.  He remains on tamsulosin and has discontinued the Myrbetriq.  With his history of urinary retention and elevated residual  would not recommend anticholinergic medication.  I discussed adding finasteride and it would take 6 months to determine efficacy.  UroLift was also discussed and he would need TRUS/cystoscopy to see if he is a UroLift candidate.  He is undecided what he wants to do at this point and states although his symptoms are bothersome he presently can live with him.  He would like to think over these options.  We will see him back in 6 months with a bladder scan and if he elects further treatment with medication or UroLift he will call back earlier.  Greater than 50% of this 15-minute visit was spent counseling the patient.   Abbie Sons, [place] [address], [place], [place] [address] [phone]

## 2020-06-22 ENCOUNTER — Other Ambulatory Visit: Payer: Self-pay

## 2020-06-22 ENCOUNTER — Encounter: Payer: Self-pay | Admitting: Urology

## 2020-06-22 ENCOUNTER — Ambulatory Visit (INDEPENDENT_AMBULATORY_CARE_PROVIDER_SITE_OTHER): Payer: Medicare Other | Admitting: Urology

## 2020-06-22 VITALS — BP 157/71 | HR 62 | Ht 72.0 in | Wt 175.0 lb

## 2020-06-22 DIAGNOSIS — N401 Enlarged prostate with lower urinary tract symptoms: Secondary | ICD-10-CM

## 2020-06-22 DIAGNOSIS — R350 Frequency of micturition: Secondary | ICD-10-CM

## 2020-06-22 DIAGNOSIS — Z87898 Personal history of other specified conditions: Secondary | ICD-10-CM

## 2020-06-22 MED ORDER — SILODOSIN 8 MG PO CAPS: 8.0000 mg | ORAL_CAPSULE | Freq: Every day | ORAL | 0 refills | Status: DC

## 2020-06-22 NOTE — Progress Notes (Signed)
06/22/2020 11:44 AM   Michael Gilbert 1928-10-23 [id number]  Referring provider: Derinda Late, MD [phone] [facility] [place],  [place] [address]  Chief Complaint  Patient presents with   Benign Prostatic Hypertrophy    HPI: 86 y.o. male presents for 39-month follow-up of BPH/LUTS   Episode urinary retention February 2019 after colonoscopy and hospitalization for lower GI bleed requiring ICU admission  Urinary retention subsequently resolved  Baseline daytime urinary frequency which was present prior to his episode of retention  Voids 10-11 times per day  Nocturia x0-2  Occasional episodes of urge incontinence  On tamsulosin 0.8 mg  Denies dysuria, gross hematuria  No flank, abdominal or pelvic pain  Trial Myrbetriq without improvement in LUTS   PMH: Past Medical History:  Diagnosis Date   Allergy    Bradycardia    Colorectal polyps    Diverticulosis    GERD (gastroesophageal reflux disease)    Hypertension     Surgical History: Past Surgical History:  Procedure Laterality Date   CATARACT EXTRACTION     COLONOSCOPY WITH PROPOFOL N/A 12/06/2017   Procedure: COLONOSCOPY WITH PROPOFOL;  Surgeon: Lin Landsman, MD;  Location: [facility] ENDOSCOPY;  Service: Gastroenterology;  Laterality: N/A;   ESOPHAGOGASTRODUODENOSCOPY (EGD) WITH PROPOFOL N/A 01/01/2018   Procedure: ESOPHAGOGASTRODUODENOSCOPY (EGD) WITH PROPOFOL;  Surgeon: Lin Landsman, MD;  Location: [facility] ENDOSCOPY;  Service: Gastroenterology;  Laterality: N/A;   TONSILECTOMY, ADENOIDECTOMY, BILATERAL MYRINGOTOMY AND TUBES      Home Medications:  Allergies as of 06/22/2020      Reactions   Ace Inhibitors Cough   Lovastatin Other (See Comments)   Other reaction(s): Muscle Pain   Simvastatin    Other reaction(s): Muscle Pain      Medication List       Accurate as of June 22, 2020 11:44 AM. If you have any questions, ask your nurse or  doctor.        STOP taking these medications   mirabegron ER 50 MG Tb24 tablet Commonly known as: MYRBETRIQ Stopped by: Abbie Sons, MD     TAKE these medications   Eliquis 2.5 MG Tabs tablet Generic drug: apixaban SMARTSIG:1 Tablet(s) By Mouth Every 12 Hours   fluorouracil 5 % cream Commonly known as: EFUDEX Apply to scal TWICE DAILY FOR 5-6 days   fluticasone 50 MCG/ACT nasal spray Commonly known as: FLONASE Place 1 spray into both nostrils daily as needed.   losartan 25 MG tablet Commonly known as: COZAAR   Multi-Vitamins Tabs Take 1 tablet by mouth daily.   mupirocin ointment 2 % Commonly known as: BACTROBAN   omeprazole 20 MG capsule Commonly known as: PRILOSEC   tamsulosin 0.4 MG Caps capsule Commonly known as: FLOMAX TAKE 2 CAPSULES EVERY DAY       Allergies:  Allergies  Allergen Reactions   Ace Inhibitors Cough   Lovastatin Other (See Comments)    Other reaction(s): Muscle Pain   Simvastatin     Other reaction(s): Muscle Pain    Family History: Family History  Problem Relation Age of Onset   Diabetes Father    Diabetes Brother    Bladder Cancer Neg Hx    Kidney cancer Neg Hx    Prostate cancer Neg Hx     Social History:  reports that he has quit smoking. He has never used smokeless tobacco. He reports current alcohol use of about 1.0 standard drink of alcohol per week. He reports  that he does not use drugs.   Physical Exam: BP (!) 157/71    Pulse 62    Ht 6' (1.829 m)    Wt 175 lb (79.4 kg)    BMI 23.73 kg/m   Constitutional:  Alert and oriented, No acute distress. HEENT: [place] AT, moist mucus membranes.  Trachea midline, no masses. Cardiovascular: No clubbing, cyanosis, or edema. Respiratory: Normal respiratory effort, no increased work of breathing.    Assessment & Plan:    1. Benign prostatic hyperplasia with urinary frequency  Bladder scan PVR 49 mL  No improvement on beta 3 agonists and would not recommend an  anticholinergic medication secondary to history of urinary retention and age  Discussed adding a 5-ARI medication however due to the length of time it would take to achieve efficacy he does not desire to start  He was interested in a trial of a prostate-specific alpha-blocker and Rx low-dose and was sent to pharmacy  He will take for 30 days and if no improvement will go back to taking tamsulosin  Follow-up 1 year or earlier as needed for any significant change in his symptoms   Abbie Sons, MD  [facility] [address], [place], [place] [address] [phone]

## 2020-06-24 ENCOUNTER — Encounter: Payer: Self-pay | Admitting: Urology

## 2020-06-24 DIAGNOSIS — Z87898 Personal history of other specified conditions: Secondary | ICD-10-CM | POA: Insufficient documentation

## 2020-06-30 ENCOUNTER — Ambulatory Visit: Payer: Self-pay | Attending: Internal Medicine

## 2020-06-30 DIAGNOSIS — Z23 Encounter for immunization: Secondary | ICD-10-CM

## 2020-06-30 NOTE — Progress Notes (Signed)
   [facility]  Name:  IOSEFA WEINTRAUB    MRN: [id number] DOB: 04-23-1928  06/30/2020  Mr. Gotham was observed post Covid-19 immunization for 15 minutes without incident. He was provided with Vaccine Information Sheet and instruction to access the V-Safe system.   Mr. Sayres was instructed to call 911 with any severe reactions post vaccine: Marland Kitchen Difficulty breathing  . Swelling of face and throat  . A fast heartbeat  . A bad rash all over body  . Dizziness and weakness

## 2020-07-27 ENCOUNTER — Telehealth: Payer: Self-pay | Admitting: *Deleted

## 2020-07-27 NOTE — Telephone Encounter (Signed)
Notified patient as instructed, patient pleased. Discussed follow-up appointments, patient agrees  

## 2020-07-27 NOTE — Telephone Encounter (Signed)
He can stop the Rapaflo go back to Flomax

## 2020-07-27 NOTE — Telephone Encounter (Signed)
Patient calling stating that he was taking Flomax and he used that bathrooms 12-15 times per day and since he was switched to Rapaflo the problem has doubled per pt he's going about 20 times per day. Pt wanting to know if he should switch back to Flomax?

## 2020-08-19 ENCOUNTER — Other Ambulatory Visit: Payer: Self-pay | Admitting: Urology

## 2020-09-05 ENCOUNTER — Other Ambulatory Visit: Payer: Self-pay

## 2020-09-05 ENCOUNTER — Emergency Department: Payer: Medicare Other

## 2020-09-05 ENCOUNTER — Encounter: Payer: Self-pay | Admitting: Emergency Medicine

## 2020-09-05 ENCOUNTER — Inpatient Hospital Stay
Admit: 2020-09-05 | Discharge: 2020-09-05 | Disposition: A | Discharge status: Home / Self Care | Payer: Medicare Other | Attending: Internal Medicine | Admitting: Internal Medicine

## 2020-09-05 ENCOUNTER — Inpatient Hospital Stay
Admission: EM | Admit: 2020-09-05 | Discharge: 2020-09-08 | DRG: 229 | Disposition: A | Discharge status: Home / Self Care | Payer: Medicare Other | Attending: Family Medicine | Admitting: Family Medicine

## 2020-09-05 DIAGNOSIS — W19XXXA Unspecified fall, initial encounter: Secondary | ICD-10-CM

## 2020-09-05 DIAGNOSIS — J449 Chronic obstructive pulmonary disease, unspecified: Secondary | ICD-10-CM | POA: Diagnosis present

## 2020-09-05 DIAGNOSIS — I498 Other specified cardiac arrhythmias: Secondary | ICD-10-CM

## 2020-09-05 DIAGNOSIS — W1839XA Other fall on same level, initial encounter: Secondary | ICD-10-CM | POA: Diagnosis present

## 2020-09-05 DIAGNOSIS — Z7901 Long term (current) use of anticoagulants: Secondary | ICD-10-CM | POA: Diagnosis not present

## 2020-09-05 DIAGNOSIS — S0240CA Maxillary fracture, right side, initial encounter for closed fracture: Secondary | ICD-10-CM | POA: Diagnosis present

## 2020-09-05 DIAGNOSIS — K219 Gastro-esophageal reflux disease without esophagitis: Secondary | ICD-10-CM

## 2020-09-05 DIAGNOSIS — R350 Frequency of micturition: Secondary | ICD-10-CM | POA: Diagnosis present

## 2020-09-05 DIAGNOSIS — N400 Enlarged prostate without lower urinary tract symptoms: Secondary | ICD-10-CM | POA: Diagnosis present

## 2020-09-05 DIAGNOSIS — E785 Hyperlipidemia, unspecified: Secondary | ICD-10-CM | POA: Diagnosis present

## 2020-09-05 DIAGNOSIS — I129 Hypertensive chronic kidney disease with stage 1 through stage 4 chronic kidney disease, or unspecified chronic kidney disease: Secondary | ICD-10-CM | POA: Diagnosis present

## 2020-09-05 DIAGNOSIS — Z79899 Other long term (current) drug therapy: Secondary | ICD-10-CM | POA: Diagnosis not present

## 2020-09-05 DIAGNOSIS — G4733 Obstructive sleep apnea (adult) (pediatric): Secondary | ICD-10-CM | POA: Diagnosis present

## 2020-09-05 DIAGNOSIS — Y92007 Garden or yard of unspecified non-institutional (private) residence as the place of occurrence of the external cause: Secondary | ICD-10-CM

## 2020-09-05 DIAGNOSIS — Z87891 Personal history of nicotine dependence: Secondary | ICD-10-CM | POA: Diagnosis not present

## 2020-09-05 DIAGNOSIS — N183 Chronic kidney disease, stage 3 unspecified: Secondary | ICD-10-CM | POA: Diagnosis present

## 2020-09-05 DIAGNOSIS — Z20822 Contact with and (suspected) exposure to covid-19: Secondary | ICD-10-CM | POA: Diagnosis present

## 2020-09-05 DIAGNOSIS — I495 Sick sinus syndrome: Principal | ICD-10-CM

## 2020-09-05 DIAGNOSIS — I10 Essential (primary) hypertension: Secondary | ICD-10-CM

## 2020-09-05 DIAGNOSIS — R55 Syncope and collapse: Secondary | ICD-10-CM | POA: Diagnosis present

## 2020-09-05 DIAGNOSIS — S00211A Abrasion of right eyelid and periocular area, initial encounter: Secondary | ICD-10-CM | POA: Diagnosis present

## 2020-09-05 DIAGNOSIS — I4892 Unspecified atrial flutter: Secondary | ICD-10-CM | POA: Diagnosis present

## 2020-09-05 DIAGNOSIS — K5790 Diverticulosis of intestine, part unspecified, without perforation or abscess without bleeding: Secondary | ICD-10-CM | POA: Insufficient documentation

## 2020-09-05 DIAGNOSIS — S0292XB Unspecified fracture of facial bones, initial encounter for open fracture: Secondary | ICD-10-CM

## 2020-09-05 DIAGNOSIS — R001 Bradycardia, unspecified: Secondary | ICD-10-CM

## 2020-09-05 LAB — RESPIRATORY PANEL BY RT PCR (FLU A&B, COVID)
Influenza A by PCR: NEGATIVE
Influenza B by PCR: NEGATIVE
SARS Coronavirus 2 by RT PCR: NEGATIVE

## 2020-09-05 LAB — URINALYSIS, COMPLETE (UACMP) WITH MICROSCOPIC
Bacteria, UA: NONE SEEN
Bilirubin Urine: NEGATIVE
Glucose, UA: NEGATIVE mg/dL
Ketones, ur: NEGATIVE mg/dL
Leukocytes,Ua: NEGATIVE
Nitrite: NEGATIVE
Protein, ur: 30 mg/dL — AB
Specific Gravity, Urine: 1.024 (ref 1.005–1.030)
pH: 6 (ref 5.0–8.0)

## 2020-09-05 LAB — HEPATIC FUNCTION PANEL
ALT: 29 U/L (ref 0–44)
AST: 63 U/L — ABNORMAL HIGH (ref 15–41)
Albumin: 3.7 g/dL (ref 3.5–5.0)
Alkaline Phosphatase: 83 U/L (ref 38–126)
Bilirubin, Direct: 0.1 mg/dL (ref 0.0–0.2)
Indirect Bilirubin: 0.8 mg/dL (ref 0.3–0.9)
Total Bilirubin: 0.9 mg/dL (ref 0.3–1.2)
Total Protein: 7.9 g/dL (ref 6.5–8.1)

## 2020-09-05 LAB — BASIC METABOLIC PANEL
Anion gap: 10 (ref 5–15)
BUN: 30 mg/dL — ABNORMAL HIGH (ref 8–23)
CO2: 19 mmol/L — ABNORMAL LOW (ref 22–32)
Calcium: 8.6 mg/dL — ABNORMAL LOW (ref 8.9–10.3)
Chloride: 106 mmol/L (ref 98–111)
Creatinine, Ser: 1.33 mg/dL — ABNORMAL HIGH (ref 0.61–1.24)
GFR, Estimated: 51 mL/min — ABNORMAL LOW (ref >60)
Glucose, Bld: 111 mg/dL — ABNORMAL HIGH (ref 70–99)
Potassium: 4.2 mmol/L (ref 3.5–5.1)
Sodium: 135 mmol/L (ref 135–145)

## 2020-09-05 LAB — CBC
HCT: 42 % (ref 39.0–52.0)
Hemoglobin: 13.6 g/dL (ref 13.0–17.0)
MCH: 29.2 pg (ref 26.0–34.0)
MCHC: 32.4 g/dL (ref 30.0–36.0)
MCV: 90.1 fL (ref 80.0–100.0)
Platelets: 160 10*3/uL (ref 150–400)
RBC: 4.66 MIL/uL (ref 4.22–5.81)
RDW: 14.9 % (ref 11.5–15.5)
WBC: 7.2 10*3/uL (ref 4.0–10.5)
nRBC: 0 % (ref 0.0–0.2)

## 2020-09-05 LAB — PROTIME-INR
INR: 1.2 (ref 0.8–1.2)
Prothrombin Time: 14.6 seconds (ref 11.4–15.2)

## 2020-09-05 LAB — TROPONIN I (HIGH SENSITIVITY): Troponin I (High Sensitivity): 10 ng/L (ref <18)

## 2020-09-05 LAB — LACTIC ACID, PLASMA: Lactic Acid, Venous: 0.9 mmol/L (ref 0.5–1.9)

## 2020-09-05 MED ORDER — LACTATED RINGERS IV BOLUS
500.0000 mL | Freq: Once | INTRAVENOUS | Status: AC
  Administered 2020-09-05: 500 mL via INTRAVENOUS

## 2020-09-05 MED ORDER — HYDRALAZINE HCL 20 MG/ML IJ SOLN: 10.0000 mg | Freq: Three times a day (TID) | INTRAMUSCULAR | Status: DC | PRN

## 2020-09-05 MED ORDER — LOSARTAN POTASSIUM 50 MG PO TABS
25.0000 mg | ORAL_TABLET | Freq: Once | ORAL | Status: AC
  Administered 2020-09-05: 25 mg via ORAL
  Filled 2020-09-05: qty 1

## 2020-09-05 MED ORDER — PANTOPRAZOLE SODIUM 40 MG IV SOLR
40.0000 mg | INTRAVENOUS | Status: DC
  Administered 2020-09-06 (×2): 40 mg via INTRAVENOUS
  Filled 2020-09-05 (×2): qty 40

## 2020-09-05 MED ORDER — TETANUS-DIPHTH-ACELL PERTUSSIS 5-2.5-18.5 LF-MCG/0.5 IM SUSY: 0.5000 mL | PREFILLED_SYRINGE | Freq: Once | INTRAMUSCULAR | Status: DC

## 2020-09-05 MED ORDER — ACETAMINOPHEN 500 MG PO TABS
1000.0000 mg | ORAL_TABLET | Freq: Once | ORAL | Status: AC
  Administered 2020-09-05: 1000 mg via ORAL
  Filled 2020-09-05: qty 2

## 2020-09-05 MED ORDER — SODIUM CHLORIDE 0.9 % IV SOLN
1.5000 g | Freq: Once | INTRAVENOUS | Status: AC
  Administered 2020-09-05: 1.5 g via INTRAVENOUS
  Filled 2020-09-05: qty 4

## 2020-09-05 MED ORDER — SODIUM CHLORIDE 0.9 % IV SOLN: INTRAVENOUS | Status: AC

## 2020-09-05 MED ORDER — IOHEXOL 300 MG/ML  SOLN
100.0000 mL | Freq: Once | INTRAMUSCULAR | Status: AC | PRN
  Administered 2020-09-05: 100 mL via INTRAVENOUS

## 2020-09-05 MED ORDER — ACETAMINOPHEN 325 MG PO TABS
650.0000 mg | ORAL_TABLET | Freq: Four times a day (QID) | ORAL | Status: DC | PRN
  Filled 2020-09-05: qty 2

## 2020-09-05 MED ORDER — TAMSULOSIN HCL 0.4 MG PO CAPS
0.8000 mg | ORAL_CAPSULE | Freq: Every day | ORAL | Status: DC
  Administered 2020-09-06 – 2020-09-08 (×3): 0.8 mg via ORAL
  Filled 2020-09-05 (×3): qty 2

## 2020-09-05 MED ORDER — SODIUM CHLORIDE 0.9% FLUSH
3.0000 mL | Freq: Two times a day (BID) | INTRAVENOUS | Status: DC
  Administered 2020-09-05 – 2020-09-07 (×4): 3 mL via INTRAVENOUS

## 2020-09-05 NOTE — Assessment & Plan Note (Signed)
Pt is on Eliquis and we have to hold it currently rate is controlled but d/w pt about risk of cva due to withholding his blood thinner.  2D Echo, greatly appreciate cardiology evaluation and management. We will continue his losartan with prn hydralazine.  Cardiac diet. Cha2ds2 vasc score of 3 so he may be able to continue just with aspirin 81 but will defer to cardiology.

## 2020-09-05 NOTE — ED Notes (Signed)
Spoke with MD regarding further imaging - new orders in place

## 2020-09-05 NOTE — ED Notes (Signed)
Ok to discuss with Coralyn Mark, daughter, [phone].

## 2020-09-05 NOTE — H&P (Signed)
History and Physical    Michael Gilbert:[id number] DOB: 1928-07-11 DOA: 09/05/2020  PCP: Derinda Late, MD    Patient coming from:  Home   Chief Complaint:  Syncope   HPI: Michael Gilbert is a 86 y.o. male with medical history significant of atrial flutter on Eliquis and htn for syncope and collapse with fall on face with facial Fracture and cardiology Dr.Kowalski Consulted by edmd Dr.smith. Suspect pt had syncope related to his bph med or could be related to his bradycardia .    ED Course:  Vitals:   09/05/20 1430 09/05/20 1445 09/05/20 1515 09/05/20 1530  BP: (!) 196/82 (!) 168/133 (!) 149/83 (!) 145/87  Pulse: (!) 58 (!) 57 (!) 32 (!) 36  Resp: 19 17 18 17   Temp:      TempSrc:      SpO2: 100% 98% 98% 98%  Weight:      Height:      Labs are stable and kidney function is at baseline.  CT imagine showed orbital and facial fracture.   Review of Systems:  Review of Systems  All other systems reviewed and are negative.    Past Medical History:  Diagnosis Date  . Allergy   . Benign essential hypertension 06/23/2014  . Bradycardia   . Colorectal polyps   . Diverticulosis   . GERD (gastroesophageal reflux disease)   . Hypertension     Past Surgical History:  Procedure Laterality Date  . CATARACT EXTRACTION    . COLONOSCOPY WITH PROPOFOL N/A 12/06/2017   Procedure: COLONOSCOPY WITH PROPOFOL;  Surgeon: Lin Landsman, MD;  Location: [facility] ENDOSCOPY;  Service: Gastroenterology;  Laterality: N/A;  . ESOPHAGOGASTRODUODENOSCOPY (EGD) WITH PROPOFOL N/A 01/01/2018   Procedure: ESOPHAGOGASTRODUODENOSCOPY (EGD) WITH PROPOFOL;  Surgeon: Lin Landsman, MD;  Location: [facility] ENDOSCOPY;  Service: Gastroenterology;  Laterality: N/A;  . TONSILECTOMY, ADENOIDECTOMY, BILATERAL MYRINGOTOMY AND TUBES       reports that he has quit smoking. He has never used smokeless tobacco. He reports current alcohol use of about 1.0 standard drink of alcohol per week. He reports that he  does not use drugs.  Allergies  Allergen Reactions  . Ace Inhibitors Cough  . Lovastatin Other (See Comments)    Other reaction(s): Muscle Pain  . Simvastatin     Other reaction(s): Muscle Pain    Family History  Problem Relation Age of Onset  . Diabetes Father   . Diabetes Brother   . Bladder Cancer Neg Hx   . Kidney cancer Neg Hx   . Prostate cancer Neg Hx     Prior to Admission medications   Medication Sig Start Date End Date Taking? Authorizing Provider  ELIQUIS 2.5 MG TABS tablet SMARTSIG:1 Tablet(s) By Mouth Every 12 Hours 12/07/19   [provider]  fluorouracil (EFUDEX) 5 % cream Apply to [place] 5-6 days 08/02/19   [provider]  fluticasone (FLONASE) 50 MCG/ACT nasal spray Place 1 spray into both nostrils daily as needed.  07/03/17   [provider]  losartan (COZAAR) 25 MG tablet  04/07/19   [provider]  Multiple Vitamin (MULTI-VITAMINS) TABS Take 1 tablet by mouth daily.     [provider]  mupirocin ointment (BACTROBAN) 2 %  08/02/19   [provider]  omeprazole (PRILOSEC) 20 MG capsule  03/06/18   [provider]  tamsulosin (FLOMAX) 0.4 MG CAPS capsule TAKE 2 CAPSULES EVERY DAY 08/20/20   McGowan, Hunt Oris, PA-C  Physical Exam: Vitals:   09/05/20 1430 09/05/20 1445 09/05/20 1515 09/05/20 1530  BP: (!) 196/82 (!) 168/133 (!) 149/83 (!) 145/87  Pulse: (!) 58 (!) 57 (!) 32 (!) 36  Resp: 19 17 18 17   Temp:      TempSrc:      SpO2: 100% 98% 98% 98%  Weight:      Height:        Physical Exam Constitutional:      Appearance: He is well-developed and normal weight.  HENT:     Head: Raccoon eyes, right periorbital erythema and laceration present.  Eyes:     General: Lids are normal.  Neck:     Thyroid: No thyroid mass.     Vascular: No carotid bruit.  Cardiovascular:     Rate and Rhythm: Normal rate and regular rhythm.     Heart sounds: Normal heart sounds and S1 normal.    Pulmonary:     Effort: Pulmonary effort is normal.     Breath sounds: Normal breath sounds and air entry.  Musculoskeletal:     Cervical back: Normal range of motion.  Neurological:     Mental Status: He is alert.    Media Information   Document Information  Photos    09/05/2020 15:35  Attached To:  Hospital Encounter on 09/05/20  Source Information  Para Skeans, MD  [facility]   Labs on Admission: I have personally reviewed following labs and imaging studies  CBC: Recent Labs  Lab 09/05/20 1112  WBC 7.2  HGB 13.6  HCT 42.0  MCV 90.1  PLT 595   Basic Metabolic Panel: Recent Labs  Lab 09/05/20 1112  NA 135  K 4.2  CL 106  CO2 19*  GLUCOSE 111*  BUN 30*  CREATININE 1.33*  CALCIUM 8.6*   GFR: Estimated Creatinine Clearance: 40.3 mL/min (A) (by C-G formula based on SCr of 1.33 mg/dL (H)). Liver Function Tests: Recent Labs  Lab 09/05/20 1325  AST 63*  ALT 29  ALKPHOS 83  BILITOT 0.9  PROT 7.9  ALBUMIN 3.7   No results for input(s): LIPASE, AMYLASE in the last 168 hours. No results for input(s): AMMONIA in the last 168 hours. Coagulation Profile: Recent Labs  Lab 09/05/20 1325  INR 1.2   Cardiac Enzymes: No results for input(s): CKTOTAL, CKMB, CKMBINDEX, TROPONINI in the last 168 hours. BNP (last 3 results) No results for input(s): PROBNP in the last 8760 hours. HbA1C: No results for input(s): HGBA1C in the last 72 hours. CBG: No results for input(s): GLUCAP in the last 168 hours. Lipid Profile: No results for input(s): CHOL, HDL, LDLCALC, TRIG, CHOLHDL, LDLDIRECT in the last 72 hours. Thyroid Function Tests: No results for input(s): TSH, T4TOTAL, FREET4, T3FREE, THYROIDAB in the last 72 hours. Anemia Panel: No results for input(s): VITAMINB12, FOLATE, FERRITIN, TIBC, IRON, RETICCTPCT in the last 72 hours. Urine analysis:    Component Value Date/Time   COLORURINE YELLOW (A) 09/05/2020 1146   APPEARANCEUR CLEAR (A)  09/05/2020 1146   APPEARANCEUR Clear 10/06/2017 0843   LABSPEC 1.024 09/05/2020 1146   PHURINE 6.0 09/05/2020 1146   GLUCOSEU NEGATIVE 09/05/2020 1146   HGBUR SMALL (A) 09/05/2020 1146   BILIRUBINUR NEGATIVE 09/05/2020 1146   BILIRUBINUR Negative 10/06/2017 0843   KETONESUR NEGATIVE 09/05/2020 1146   PROTEINUR 30 (A) 09/05/2020 1146   NITRITE NEGATIVE 09/05/2020 1146   LEUKOCYTESUR NEGATIVE 09/05/2020 1146   No intake or output data in the 24 hours ending 09/05/20 1606  Lab Results  Component Value Date   CREATININE 1.33 (H) 09/05/2020   CREATININE 1.36 (H) 12/07/2017   CREATININE 1.15 12/06/2017    COVID-19 Labs  No results for input(s): DDIMER, FERRITIN, LDH, CRP in the last 72 hours.  Lab Results  Component Value Date   [place] NEGATIVE 09/05/2020    Radiological Exams on Admission: CT HEAD WO CONTRAST  Result Date: 09/05/2020 CLINICAL DATA:  Head trauma, loss of consciousness. Facial trauma. Neck trauma. Additional history provided: Ground level fall, injury to right face/eye, TMJ pain. EXAM: CT HEAD WITHOUT CONTRAST CT MAXILLOFACIAL WITHOUT CONTRAST CT CERVICAL SPINE WITHOUT CONTRAST TECHNIQUE: Multidetector CT imaging of the head, cervical spine, and maxillofacial structures were performed using the standard protocol without intravenous contrast. Multiplanar CT image reconstructions of the cervical spine and maxillofacial structures were also generated. COMPARISON:  MRI/MRA head 08/19/2010. FINDINGS: CT HEAD FINDINGS Brain: Mild generalized cerebral atrophy. Advanced ill-defined hypoattenuation within the cerebral white matter is nonspecific, but compatible with chronic small vessel ischemic disease. There is no acute intracranial hemorrhage. No demarcated cortical infarct. No extra-axial fluid collection. No evidence of intracranial mass. No midline shift. Vascular: No hyperdense vessel.  Atherosclerotic calcifications. Skull: Normal. Negative for fracture or focal lesion.  Other: Trace fluid within the inferior right mastoid air cells. CT MAXILLOFACIAL FINDINGS Osseous: Minimally displaced acute fracture of the lateral right orbital rim at the expected location of the zygomaticofrontal suture (series 8, image 28). Acute, comminuted, mildly displaced fractures of the lateral wall of the right orbit (series 3, images 20-24). Acute comminuted and mildly displaced fractures of the posterolateral wall of the right maxillary sinus. Acute, comminuted and mildly displaced fractures of the anterior wall the right maxillary sinus. Suspect associated nondisplaced acute fractures of the inferior right orbital rim and right orbital floor, involving the right infraorbital foramen. Orbits: There are tiny foci of extraconal gas and hemorrhage within the inferior right orbit (for instance as seen on series 6, image 31). The globes are normal in size and contour. The extraocular muscles and optic nerve sheath complexes are symmetric and unremarkable. Sinuses: Near complete opacification of the right maxillary sinus with hyperdense fluid, likely reflecting blood products. Mild ethmoid and left maxillary sinus mucosal thickening. Small amount of frothy secretions within the left maxillary sinus. Soft tissues: Prominent right periorbital and pre maxillary soft tissue swelling. Small foci of gas within the soft tissues ventral and posterior to the maxillary sinus. CT CERVICAL SPINE FINDINGS Alignment: Reversal of the expected cervical lordosis. Trace C2-C3 and C3-C4 grade 1 anterolisthesis. Trace C4-C5 grade 1 retrolisthesis. Trace T1-T2 grade 1 anterolisthesis. Skull base and vertebrae: The basion-dental and atlanto-dental intervals are maintained.No evidence of acute fracture to the cervical spine. Small T2 superior endplate Schmorl node. Soft tissues and spinal canal: No prevertebral fluid or swelling. No visible canal hematoma. Disc levels: Spondylosis with multilevel disc space narrowing, posterior  disc osteophytes, uncovertebral hypertrophy and facet arthrosis. Disc space narrowing is severe at C4-C5 and C6-C7. Upper chest: Reported separately. No consolidation within the imaged lung apices. No visible pneumothorax IMPRESSION: CT head: 1. No evidence of acute intracranial abnormality. 2. Advanced cerebral white matter chronic small vessel ischemic disease 3. Mild cerebral atrophy. 4. Trace right mastoid effusion. CT maxillofacial: 1. Acute fractures of the lateral rim, lateral wall and likely inferior rim/floor of the right orbit as described. 2. Acute fractures of the anterior and posterolateral walls of the right maxillary sinus as described. 3. Prominent right periorbital and pre maxillary soft tissue  swelling. 4. Small amount of extraconal gas and hemorrhage within the inferior right orbit. 5. Blood products near completely opacify the right maxillary sinus. CT cervical spine: 1. No evidence of acute fracture to the cervical spine. 2. Nonspecific reversal of the expected cervical lordosis. Mild multilevel grade 1 spondylolisthesis as detailed. 3. Cervical spondylosis as described. Electronically Signed   By: Kellie Simmering DO   On: 09/05/2020 12:55   CT Cervical Spine Wo Contrast  Result Date: 09/05/2020 CLINICAL DATA:  Head trauma, loss of consciousness. Facial trauma. Neck trauma. Additional history provided: Ground level fall, injury to right face/eye, TMJ pain. EXAM: CT HEAD WITHOUT CONTRAST CT MAXILLOFACIAL WITHOUT CONTRAST CT CERVICAL SPINE WITHOUT CONTRAST TECHNIQUE: Multidetector CT imaging of the head, cervical spine, and maxillofacial structures were performed using the standard protocol without intravenous contrast. Multiplanar CT image reconstructions of the cervical spine and maxillofacial structures were also generated. COMPARISON:  MRI/MRA head 08/19/2010. FINDINGS: CT HEAD FINDINGS Brain: Mild generalized cerebral atrophy. Advanced ill-defined hypoattenuation within the cerebral white  matter is nonspecific, but compatible with chronic small vessel ischemic disease. There is no acute intracranial hemorrhage. No demarcated cortical infarct. No extra-axial fluid collection. No evidence of intracranial mass. No midline shift. Vascular: No hyperdense vessel.  Atherosclerotic calcifications. Skull: Normal. Negative for fracture or focal lesion. Other: Trace fluid within the inferior right mastoid air cells. CT MAXILLOFACIAL FINDINGS Osseous: Minimally displaced acute fracture of the lateral right orbital rim at the expected location of the zygomaticofrontal suture (series 8, image 28). Acute, comminuted, mildly displaced fractures of the lateral wall of the right orbit (series 3, images 20-24). Acute comminuted and mildly displaced fractures of the posterolateral wall of the right maxillary sinus. Acute, comminuted and mildly displaced fractures of the anterior wall the right maxillary sinus. Suspect associated nondisplaced acute fractures of the inferior right orbital rim and right orbital floor, involving the right infraorbital foramen. Orbits: There are tiny foci of extraconal gas and hemorrhage within the inferior right orbit (for instance as seen on series 6, image 31). The globes are normal in size and contour. The extraocular muscles and optic nerve sheath complexes are symmetric and unremarkable. Sinuses: Near complete opacification of the right maxillary sinus with hyperdense fluid, likely reflecting blood products. Mild ethmoid and left maxillary sinus mucosal thickening. Small amount of frothy secretions within the left maxillary sinus. Soft tissues: Prominent right periorbital and pre maxillary soft tissue swelling. Small foci of gas within the soft tissues ventral and posterior to the maxillary sinus. CT CERVICAL SPINE FINDINGS Alignment: Reversal of the expected cervical lordosis. Trace C2-C3 and C3-C4 grade 1 anterolisthesis. Trace C4-C5 grade 1 retrolisthesis. Trace T1-T2 grade 1  anterolisthesis. Skull base and vertebrae: The basion-dental and atlanto-dental intervals are maintained.No evidence of acute fracture to the cervical spine. Small T2 superior endplate Schmorl node. Soft tissues and spinal canal: No prevertebral fluid or swelling. No visible canal hematoma. Disc levels: Spondylosis with multilevel disc space narrowing, posterior disc osteophytes, uncovertebral hypertrophy and facet arthrosis. Disc space narrowing is severe at C4-C5 and C6-C7. Upper chest: Reported separately. No consolidation within the imaged lung apices. No visible pneumothorax IMPRESSION: CT head: 1. No evidence of acute intracranial abnormality. 2. Advanced cerebral white matter chronic small vessel ischemic disease 3. Mild cerebral atrophy. 4. Trace right mastoid effusion. CT maxillofacial: 1. Acute fractures of the lateral rim, lateral wall and likely inferior rim/floor of the right orbit as described. 2. Acute fractures of the anterior and posterolateral walls of the right  maxillary sinus as described. 3. Prominent right periorbital and pre maxillary soft tissue swelling. 4. Small amount of extraconal gas and hemorrhage within the inferior right orbit. 5. Blood products near completely opacify the right maxillary sinus. CT cervical spine: 1. No evidence of acute fracture to the cervical spine. 2. Nonspecific reversal of the expected cervical lordosis. Mild multilevel grade 1 spondylolisthesis as detailed. 3. Cervical spondylosis as described. Electronically Signed   By: Kellie Simmering DO   On: 09/05/2020 12:55   CT CHEST ABDOMEN PELVIS W CONTRAST  Result Date: 09/05/2020 CLINICAL DATA:  86 year old male with history of trauma from a fall earlier today. Patient is on anticoagulation. EXAM: CT CHEST, ABDOMEN, AND PELVIS WITH CONTRAST TECHNIQUE: Multidetector CT imaging of the chest, abdomen and pelvis was performed following the standard protocol during bolus administration of intravenous contrast. CONTRAST:   127mL OMNIPAQUE IOHEXOL 300 MG/ML  SOLN COMPARISON:  No prior chest CT. CT the abdomen and pelvis 12/06/2017. FINDINGS: CT CHEST FINDINGS Cardiovascular: Heart size is normal. There is no significant pericardial fluid, thickening or pericardial calcification. No acute abnormality of the thoracic aorta. There is aortic atherosclerosis, as well as atherosclerosis of the great vessels of the mediastinum and the coronary arteries, including calcified atherosclerotic plaque in the left main, left anterior descending and left circumflex coronary arteries. Thickening and calcification of the aortic valve. Mediastinum/Nodes: No high attenuation fluid collection within the mediastinum to suggest significant posttraumatic hemorrhage. No pathologically enlarged mediastinal or hilar lymph nodes. Esophagus is unremarkable in appearance. No axillary lymphadenopathy. Lungs/Pleura: No pneumothorax. No acute consolidative airspace disease. No hemothorax or pleural effusions. Diffuse bronchial wall thickening with mild to moderate centrilobular and paraseptal emphysema. In addition, there are patchy areas of ground-glass attenuation, septal thickening, traction bronchiectasis and peripheral bronchiolectasis most evident throughout the mid to lower lungs bilaterally, indicative of underlying interstitial lung disease. Musculoskeletal: No acute displaced fractures or aggressive appearing lytic or blastic lesions are noted in the visualized portions of the skeleton. CT ABDOMEN PELVIS FINDINGS Hepatobiliary: No evidence of significant acute traumatic injury to the liver. Multiple subcentimeter low-attenuation lesions scattered throughout the hepatic parenchyma, similar in size and number to the prior examination, statistically likely to represent cysts. No other larger suspicious appearing hepatic lesions. No intra or extrahepatic biliary ductal dilatation. Gallbladder is normal in appearance. Pancreas: No evidence of significant acute  traumatic injury to the pancreas. No pancreatic mass. No pancreatic ductal dilatation. No pancreatic or peripancreatic fluid collections or inflammatory changes. Spleen: Unremarkable. Specifically, no evidence of acute traumatic injury to the spleen. Adrenals/Urinary Tract: No evidence of acute traumatic injury to either kidney or adrenal gland. 1.6 cm exophytic low-attenuation lesion in the lower pole of the left kidney, compatible with a simple cyst. Right kidney and bilateral adrenal glands are normal in appearance. No hydroureteronephrosis. Urinary bladder is intact and normal in appearance. Stomach/Bowel: No definitive evidence of acute traumatic injury to the hollow viscera. The appearance of the stomach is normal. There is no pathologic dilatation of small bowel or colon. Numerous colonic diverticulae are noted, without surrounding inflammatory changes to suggests an acute diverticulitis at this time. The appendix is not confidently identified and may be surgically absent. Regardless, there are no inflammatory changes noted adjacent to the cecum to suggest the presence of an acute appendicitis at this time. Vascular/Lymphatic: Aortic atherosclerosis, without evidence of aneurysm or dissection in the abdominal or pelvic vasculature. No lymphadenopathy noted in the abdomen or pelvis. Reproductive: Prostate gland is severely enlarged with median  lobe hypertrophy measuring 6.6 x 4.6 x 7.4 cm. Seminal vesicles are unremarkable in appearance. Other: No significant volume of ascites.  No pneumoperitoneum. Musculoskeletal: There are no acute displaced fractures or aggressive appearing lytic or blastic lesions noted in the visualized portions of the skeleton. IMPRESSION: 1. No definite evidence of significant acute traumatic injury to the chest, abdomen or pelvis. 2. Mild diffuse bronchial wall thickening with mild to moderate centrilobular and paraseptal emphysema; imaging findings suggestive of underlying COPD. 3.  In addition, there is evidence of interstitial lung disease, with a spectrum of findings categorized as probable usual interstitial pneumonia (UIP) per current ATS guidelines. 4. Colonic diverticulosis without evidence of acute diverticulitis at this time. 5. Aortic atherosclerosis, in addition to left main, left anterior descending and left circumflex coronary artery disease. 6. There are calcifications of the aortic valve. Echocardiographic correlation for evaluation of potential valvular dysfunction may be warranted if clinically indicated. 7. Prostatomegaly with median lobe hypertrophy. 8. Additional incidental findings, as above. Electronically Signed   By: Vinnie Langton M.D.   On: 09/05/2020 12:52   CT T-SPINE NO CHARGE  Result Date: 09/05/2020 CLINICAL DATA:  86 year old male status post ground level fall. Pain. EXAM: CT THORACIC SPINE WITH CONTRAST TECHNIQUE: Multiplanar CT images of the thoracic spine were reconstructed from contemporary CT of the Chest. CONTRAST:  None or No additional COMPARISON:  CT cervical, chest, Abdomen, and Pelvis today are reported separately. FINDINGS: Limited cervical spine imaging: Cervicothoracic junction alignment is within normal limits. Thoracic spine segmentation:  Normal. Alignment: Preserved thoracic kyphosis. No spondylolisthesis *CRASH * that there is subtle degenerative appearing anterolisthesis of T1 on T2, with associated chronic facet and endplate degeneration. No other spondylolisthesis. Vertebrae: No acute osseous abnormality identified. Visible posterior ribs also appear intact. There is ankylosis related to anterior endplate osteophytes from the T6 to the T10 level. Possible developing facet ankylosis also on the left at T1-T2. Paraspinal and other soft tissues: Chest and upper abdominal viscera reported separately. Thoracic paraspinal soft tissues are within normal limits. Disc levels: Mild grade 1 anterolisthesis of T1 on T2 with moderate to severe disc  space loss, and endplate spurring on the right. Moderate facet hypertrophy. However, evidence of developing left side facet ankylosis. Mild bilateral T1 foraminal stenosis. Elsewhere mild for age thoracic spine degeneration. No CT evidence of thoracic spinal stenosis. IMPRESSION: 1. No acute traumatic injury identified in the thoracic spine. CT Chest, Abdomen, and Pelvis are reported separately. 2. Mild anterolisthesis at T1-T2 with disc and facet degeneration. Elsewhere mild for age thoracic spine degeneration. No CT evidence of thoracic spinal stenosis. Electronically Signed   By: Genevie Ann M.D.   On: 09/05/2020 12:43   CT L-SPINE NO CHARGE  Result Date: 09/05/2020 CLINICAL DATA:  86 year old male status post ground level fall. Pain. EXAM: CT LUMBAR SPINE WITH CONTRAST TECHNIQUE: Technique: Multiplanar CT images of the lumbar spine were reconstructed from contemporary CT of the Abdomen and Pelvis. CONTRAST:  No additional COMPARISON:  CT thoracic spine, chest, Abdomen, and Pelvis today are reported separately. CT Abdomen and Pelvis 12/06/2017. FINDINGS: Segmentation: Normal, concordant with the thoracic numbering today. Alignment: Straightening of lumbar lordosis. Mild dextroconvex lumbar scoliosis. No spondylolisthesis. Vertebrae: The lumbar vertebrae appear stable since 2019 and intact. Visible sacrum and SI joints also appear intact. Paraspinal and other soft tissues: Abdominal and pelvic viscera reported separately today. Lumbar paraspinal soft tissues remain normal. Disc levels: Chronic lumbar spine degeneration maximal at L2-L3, L4-L5, and L5-S1 does not appear significantly  changed from the 2019 CT. There is moderate to severe multifactorial spinal stenosis suspected at L4-L5. IMPRESSION: 1. No acute traumatic injury identified in the lumbar spine. 2.  CT Abdomen and Pelvis today are reported separately. 3. 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 Moderate to severe  multifactorial spinal stenosis suspected at L4-L5. Electronically Signed   By: Genevie Ann M.D.   On: 09/05/2020 12:49   CT Maxillofacial Wo Contrast  Result Date: 09/05/2020 CLINICAL DATA:  Head trauma, loss of consciousness. Facial trauma. Neck trauma. Additional history provided: Ground level fall, injury to right face/eye, TMJ pain. EXAM: CT HEAD WITHOUT CONTRAST CT MAXILLOFACIAL WITHOUT CONTRAST CT CERVICAL SPINE WITHOUT CONTRAST TECHNIQUE: Multidetector CT imaging of the head, cervical spine, and maxillofacial structures were performed using the standard protocol without intravenous contrast. Multiplanar CT image reconstructions of the cervical spine and maxillofacial structures were also generated. COMPARISON:  MRI/MRA head 08/19/2010. FINDINGS: CT HEAD FINDINGS Brain: Mild generalized cerebral atrophy. Advanced ill-defined hypoattenuation within the cerebral white matter is nonspecific, but compatible with chronic small vessel ischemic disease. There is no acute intracranial hemorrhage. No demarcated cortical infarct. No extra-axial fluid collection. No evidence of intracranial mass. No midline shift. Vascular: No hyperdense vessel.  Atherosclerotic calcifications. Skull: Normal. Negative for fracture or focal lesion. Other: Trace fluid within the inferior right mastoid air cells. CT MAXILLOFACIAL FINDINGS Osseous: Minimally displaced acute fracture of the lateral right orbital rim at the expected location of the zygomaticofrontal suture (series 8, image 28). Acute, comminuted, mildly displaced fractures of the lateral wall of the right orbit (series 3, images 20-24). Acute comminuted and mildly displaced fractures of the posterolateral wall of the right maxillary sinus. Acute, comminuted and mildly displaced fractures of the anterior wall the right maxillary sinus. Suspect associated nondisplaced acute fractures of the inferior right orbital rim and right orbital floor, involving the right infraorbital  foramen. Orbits: There are tiny foci of extraconal gas and hemorrhage within the inferior right orbit (for instance as seen on series 6, image 31). The globes are normal in size and contour. The extraocular muscles and optic nerve sheath complexes are symmetric and unremarkable. Sinuses: Near complete opacification of the right maxillary sinus with hyperdense fluid, likely reflecting blood products. Mild ethmoid and left maxillary sinus mucosal thickening. Small amount of frothy secretions within the left maxillary sinus. Soft tissues: Prominent right periorbital and pre maxillary soft tissue swelling. Small foci of gas within the soft tissues ventral and posterior to the maxillary sinus. CT CERVICAL SPINE FINDINGS Alignment: Reversal of the expected cervical lordosis. Trace C2-C3 and C3-C4 grade 1 anterolisthesis. Trace C4-C5 grade 1 retrolisthesis. Trace T1-T2 grade 1 anterolisthesis. Skull base and vertebrae: The basion-dental and atlanto-dental intervals are maintained.No evidence of acute fracture to the cervical spine. Small T2 superior endplate Schmorl node. Soft tissues and spinal canal: No prevertebral fluid or swelling. No visible canal hematoma. Disc levels: Spondylosis with multilevel disc space narrowing, posterior disc osteophytes, uncovertebral hypertrophy and facet arthrosis. Disc space narrowing is severe at C4-C5 and C6-C7. Upper chest: Reported separately. No consolidation within the imaged lung apices. No visible pneumothorax  IMPRESSION:  CT head:  1. No evidence of acute intracranial abnormality.  2. Advanced cerebral white matter chronic small vessel ischemic disease  3. Mild cerebral atrophy. 4. Trace right mastoid effusion.  CT maxillofacial:  1. Acute fractures of the lateral rim, lateral wall and likely inferior rim/floor of the right orbit as described.  2. Acute fractures of the anterior  and posterolateral walls of the right maxillary sinus as described.  3. Prominent right  periorbital and pre maxillary soft tissue swelling.  4. Small amount of extraconal gas and hemorrhage within the inferior right orbit.  5. Blood products near completely opacify the right maxillary sinus.  CT cervical spine:  1. No evidence of acute fracture to the cervical spine.  2. Nonspecific reversal of the expected cervical lordosis. Mild multilevel grade 1 spondylolisthesis as detailed.  3. Cervical spondylosis as described. Electronically Signed   By: Kellie Simmering DO   On: 09/05/2020 12:55   EKG: Independently reviewed.  Atrial flutter 35.   Assessment/Plan Syncope and collapse Assessment & Plan -Patients at highest risk from syncope include those with serious comorbidities; age >63; exertional > supine syncope; palpitations; abnormal EKG; abnormal vital signs; or abnormal exam. -However in his case I suspect it is related to Cardiac dysrhythmia/ intermittent a.fib with rvr, or Orthostatic BP changes related to Flomax. Dr.Kowalski is informed and consult requested. -This patient is at moderate/high risk for serious outcome and thus should be observed overnight on telemetry in the hospital. -Cardiac syncope is more likely when associated with palpitations, heart disease/abnormal EKG, syncope during exertion, and possibly syncope while supine.  It is less likely with precipitating factors or an autonomic prodrome. -History an exam provide the most value in evaluation of syncope; additional testing is generally low yield and high cost. -Orthostatic vital signs now and in AM -Trending troponins x3  -less likely TIA/CVA related.Carotid doppler, EEG are not necessary if there are no focal neurologic deficits but may be reasonable to order if there is concern for seizure, CVA, or head trauma. -2d echo cardiology consult. --Neuro checks  -Consider loop recorder if syncope has been recurrent, rare, and workup including event monitor has not been diagnostic; this is the gold standard in  recurrent, unexplained syncope. Fall precaution.Aspiration precaution. Local wound care and dressing.   HTN (hypertension), benign Assessment & Plan BEH, continue losartan and prn hydralazine and cardiac diet.    Atrial flutter ([place]) Assessment & Plan Pt is on Eliquis and we have to hold it currently rate is controlled but d/w pt about risk of cva due to withholding his blood thinner.  2D Echo, greatly appreciate cardiology evaluation and management. We will continue his losartan with prn hydralazine.  Cardiac diet. Cha2ds2 vasc score of 3 so he may be able to continue just with aspirin 81 but will defer to cardiology.   OSA (obstructive sleep apnea) Assessment & Plan Bipap only if tolerated by pt as he has facial fracture.    GERD (gastroesophageal reflux disease) Assessment & Plan IV ppi.    DVT prophylaxis:  SCD  Code Status:  Full code   Family Communication:  None at bedside   Disposition Plan:  TBD   Consults called:  Dr.Kowalski   Admission status: Inpatient.   Para Skeans MD [facility] Pager [phone] If 7PM-7AM, please contact night-coverage [email or web address] Password [facility] 09/05/2020, 4:06 PM

## 2020-09-05 NOTE — Assessment & Plan Note (Signed)
-  Patients at highest risk from syncope include those with serious comorbidities; age >60; exertional > supine syncope; palpitations; abnormal EKG; abnormal vital signs; or abnormal exam. -However in his case I suspect it is related to Cardiac dysrhythmia/ intermittent a.fib with rvr, or Orthostatic BP changes related to Flomax. Dr.Kowalski is informed and consult requested. -This patient is at moderate/high risk for serious outcome and thus should be observed overnight on telemetry in the hospital. -Cardiac syncope is more likely when associated with palpitations, heart disease/abnormal EKG, syncope during exertion, and possibly syncope while supine.  It is less likely with precipitating factors or an autonomic prodrome. -History an exam provide the most value in evaluation of syncope; additional testing is generally low yield and high cost. -Orthostatic vital signs now and in AM -Trending troponins x3  -less likely TIA/CVA related.Carotid doppler, EEG are not necessary if there are no focal neurologic deficits but may be reasonable to order if there is concern for seizure, CVA, or head trauma. -2d echo cardiology consult. --Neuro checks  -Consider loop recorder if syncope has been recurrent, rare, and workup including event monitor has not been diagnostic; this is the gold standard in recurrent, unexplained syncope. Fall precaution.Aspiration precaution.

## 2020-09-05 NOTE — Assessment & Plan Note (Signed)
IV ppi.   

## 2020-09-05 NOTE — ED Triage Notes (Signed)
Pt comes into the ED via POV c/o syncopal episode where he fell on the floor.  Pt hit his head and does currently take blood thinners (Eliquis).  Pt states he has never had this happen before.  Pt currently is neurologically intact.  Pt presents with bruising around the right eye.  Pt's wife states she saw him fall.

## 2020-09-05 NOTE — Assessment & Plan Note (Signed)
Bipap only if tolerated by pt as he has facial fracture.

## 2020-09-05 NOTE — Assessment & Plan Note (Signed)
BEH, continue losartan and prn hydralazine and cardiac diet.

## 2020-09-05 NOTE — ED Provider Notes (Signed)
[facility] Emergency Department Provider Note  ____________________________________________   First MD Initiated Contact with Patient 09/05/20 1153     (approximate)  I have reviewed the triage vital signs and the nursing notes.   HISTORY  Chief Complaint Loss of Consciousness and Fall   HPI Michael Gilbert is a 86 y.o. male who has medical history of diverticulosis, GERD, HTN, and A. fib a flutter with bradycardia anticoagulated on Eliquis who presents accompanied by his wife via EMS after a witnessed fall.  States he does not remember what happened but his wife states that she saw him pass out while standing and fell forward striking his face against the ground.  Patient did have LOC but is unclear how long he was out.  Patient versus some pain around his right eye but denies any other acute pain.  He states he had some pain around his right shoulder before the fall.  States he is taking his Eliquis.  He denies any neck pain, acute back pain, abdominal pain, chest pain, extremity pain, rash, or other recent sick symptoms including fevers, chills, cough, nausea, vomiting, diarrhea, dysuria, rash, or other recent falls or injuries.  Denies EtOH use, stroke use, tobacco use.         Past Medical History:  Diagnosis Date  . Allergy   . Benign essential hypertension 06/23/2014  . Bradycardia   . Colorectal polyps   . Diverticulosis   . GERD (gastroesophageal reflux disease)   . Hypertension     Patient Active Problem List   Diagnosis Date Noted  . Syncope and collapse 09/05/2020  . History of urinary retention 06/24/2020  . Atrial flutter ([place]) 12/10/2019  . Benign prostatic hyperplasia with urinary frequency 04/27/2019  . Helicobacter pylori antibody positive   . Hemorrhage of rectum and anus   . Diverticulosis of colon (without mention of hemorrhage)   . Acute blood loss anemia   . GI bleed 12/05/2017  . Bradycardia 08/30/2016  . PVC (premature  ventricular contraction) 08/30/2016  . HTN (hypertension), benign 06/23/2014  . GERD (gastroesophageal reflux disease) 06/23/2014  . OSA (obstructive sleep apnea) 06/23/2014    Past Surgical History:  Procedure Laterality Date  . CATARACT EXTRACTION    . COLONOSCOPY WITH PROPOFOL N/A 12/06/2017   Procedure: COLONOSCOPY WITH PROPOFOL;  Surgeon: Lin Landsman, MD;  Location: [facility] ENDOSCOPY;  Service: Gastroenterology;  Laterality: N/A;  . ESOPHAGOGASTRODUODENOSCOPY (EGD) WITH PROPOFOL N/A 01/01/2018   Procedure: ESOPHAGOGASTRODUODENOSCOPY (EGD) WITH PROPOFOL;  Surgeon: Lin Landsman, MD;  Location: [facility] ENDOSCOPY;  Service: Gastroenterology;  Laterality: N/A;  . TONSILECTOMY, ADENOIDECTOMY, BILATERAL MYRINGOTOMY AND TUBES      Prior to Admission medications   Medication Sig Start Date End Date Taking? Authorizing Provider  ELIQUIS 2.5 MG TABS tablet SMARTSIG:1 Tablet(s) By Mouth Every 12 Hours 12/07/19   [provider]  fluorouracil (EFUDEX) 5 % cream Apply to [place] 5-6 days 08/02/19   [provider]  fluticasone (FLONASE) 50 MCG/ACT nasal spray Place 1 spray into both nostrils daily as needed.  07/03/17   [provider]  losartan (COZAAR) 25 MG tablet  04/07/19   [provider]  Multiple Vitamin (MULTI-VITAMINS) TABS Take 1 tablet by mouth daily.     [provider]  mupirocin ointment (BACTROBAN) 2 %  08/02/19   [provider]  omeprazole (PRILOSEC) 20 MG capsule  03/06/18   [provider]  tamsulosin (FLOMAX) 0.4 MG CAPS capsule TAKE  2 CAPSULES EVERY DAY 08/20/20   Zara Council A, PA-C    Allergies Ace inhibitors, Lovastatin, and Simvastatin  Family History  Problem Relation Age of Onset  . Diabetes Father   . Diabetes Brother   . Bladder Cancer Neg Hx   . Kidney cancer Neg Hx   . Prostate cancer Neg Hx     Social History Social History   Tobacco Use  . Smoking status: Former Research scientist (life sciences)    . Smokeless tobacco: Never Used  Vaping Use  . Vaping Use: Never used  Substance Use Topics  . Alcohol use: Yes    Alcohol/week: 1.0 standard drink    Types: 1 Glasses of wine per week    Comment: 1 glass of wine daily  . Drug use: No    Review of Systems  Review of Systems  Constitutional: Negative for chills and fever.  HENT: Negative for sore throat.   Eyes: Negative for pain.  Respiratory: Negative for cough and stridor.   Cardiovascular: Negative for chest pain.  Gastrointestinal: Negative for vomiting.  Genitourinary: Negative for dysuria.  Musculoskeletal: Negative for back pain and neck pain.  Skin: Negative for rash.  Neurological: Negative for seizures, loss of consciousness and headaches.  Psychiatric/Behavioral: Negative for suicidal ideas.  All other systems reviewed and are negative.     ____________________________________________   PHYSICAL EXAM:  VITAL SIGNS: ED Triage Vitals  Enc Vitals Group     BP 09/05/20 1109 (!) 176/62     Pulse Rate 09/05/20 1109 (!) 35     Resp 09/05/20 1109 17     Temp 09/05/20 1109 98.6 F (37 C)     Temp Source 09/05/20 1109 Oral     SpO2 09/05/20 1109 98 %     Weight 09/05/20 1110 170 lb (77.1 kg)     Height 09/05/20 1110 6' (1.829 m)     Head Circumference --      Peak Flow --      Pain Score 09/05/20 1110 6     Pain Loc --      Pain Edu? --      Excl. in [place]? --    Vitals:   09/05/20 1300 09/05/20 1336  BP:  (!) 185/78  Pulse: (!) 37 (!) 53  Resp:  18  Temp:    SpO2: 97% 97%   Physical Exam Vitals and nursing note reviewed.  Constitutional:      Appearance: He is well-developed.  HENT:     Head: Normocephalic and atraumatic.     Right Ear: External ear normal.     Left Ear: External ear normal.     Nose: Nose normal.     Mouth/Throat:     Mouth: Mucous membranes are dry.  Eyes:     Conjunctiva/sclera: Conjunctivae normal.  Cardiovascular:     Rate and Rhythm: Normal rate and regular rhythm.      Heart sounds: No murmur heard.   Pulmonary:     Effort: Pulmonary effort is normal. No respiratory distress.     Breath sounds: Normal breath sounds.  Abdominal:     Palpations: Abdomen is soft.     Tenderness: There is no abdominal tenderness.  Musculoskeletal:     Cervical back: Neck supple.  Skin:    General: Skin is warm and dry.     Capillary Refill: Capillary refill takes less than 2 seconds.  Neurological:     Mental Status: He is alert and oriented to person, place, and  time.  Psychiatric:        Mood and Affect: Mood normal.     Patient has some right-sided periorbital ecchymosis and a very small abrasion over the right eyebrow.  PERRLA.  EOMI.  Cranial nerves II to XII grossly intact.  Patient is full and symmetric strength of all extremities.  Sensation is intact to light touch of all extremities.  2+ bilateral radial and DP pulses.  No step-offs tenderness or deformities over the C/T/L-spine.  No deformities overlying skin changes or tenderness over the bilateral shoulders, elbows, wrists, knees, or ankles. ____________________________________________   LABS (all labs ordered are listed, but only abnormal results are displayed)  Labs Reviewed  BASIC METABOLIC PANEL - Abnormal; Notable for the following components:      Result Value   CO2 19 (*)    Glucose, Bld 111 (*)    BUN 30 (*)    Creatinine, Ser 1.33 (*)    Calcium 8.6 (*)    GFR, Estimated 51 (*)    All other components within normal limits  URINALYSIS, COMPLETE (UACMP) WITH MICROSCOPIC - Abnormal; Notable for the following components:   Color, Urine YELLOW (*)    APPearance CLEAR (*)    Hgb urine dipstick SMALL (*)    Protein, ur 30 (*)    All other components within normal limits  HEPATIC FUNCTION PANEL - Abnormal; Notable for the following components:   AST 63 (*)    All other components within normal limits  RESPIRATORY PANEL BY RT PCR (FLU A&B, COVID)  CBC  PROTIME-INR  LACTIC ACID, PLASMA  CBG  MONITORING, ED  TROPONIN I (HIGH SENSITIVITY)  TROPONIN I (HIGH SENSITIVITY)   ____________________________________________  EKG  A flutter with a bradycardic rate at 35 with normal QRS and QTc interval nonspecific ST changes throughout. ____________________________________________  RADIOLOGY  ED MD interpretation: Multiple facial fractures on the right without evidence of intracranial hemorrhage, calvarial skull fracture, rib fractures, or spine fracture.  No pneumothorax, clear infectious process in the chest abdomen pelvis.  Official radiology report(s): CT HEAD WO CONTRAST  Result Date: 09/05/2020 CLINICAL DATA:  Head trauma, loss of consciousness. Facial trauma. Neck trauma. Additional history provided: Ground level fall, injury to right face/eye, TMJ pain. EXAM: CT HEAD WITHOUT CONTRAST CT MAXILLOFACIAL WITHOUT CONTRAST CT CERVICAL SPINE WITHOUT CONTRAST TECHNIQUE: Multidetector CT imaging of the head, cervical spine, and maxillofacial structures were performed using the standard protocol without intravenous contrast. Multiplanar CT image reconstructions of the cervical spine and maxillofacial structures were also generated. COMPARISON:  MRI/MRA head 08/19/2010. FINDINGS: CT HEAD FINDINGS Brain: Mild generalized cerebral atrophy. Advanced ill-defined hypoattenuation within the cerebral white matter is nonspecific, but compatible with chronic small vessel ischemic disease. There is no acute intracranial hemorrhage. No demarcated cortical infarct. No extra-axial fluid collection. No evidence of intracranial mass. No midline shift. Vascular: No hyperdense vessel.  Atherosclerotic calcifications. Skull: Normal. Negative for fracture or focal lesion. Other: Trace fluid within the inferior right mastoid air cells. CT MAXILLOFACIAL FINDINGS Osseous: Minimally displaced acute fracture of the lateral right orbital rim at the expected location of the zygomaticofrontal suture (series 8, image 28).  Acute, comminuted, mildly displaced fractures of the lateral wall of the right orbit (series 3, images 20-24). Acute comminuted and mildly displaced fractures of the posterolateral wall of the right maxillary sinus. Acute, comminuted and mildly displaced fractures of the anterior wall the right maxillary sinus. Suspect associated nondisplaced acute fractures of the inferior right orbital rim and right orbital  floor, involving the right infraorbital foramen. Orbits: There are tiny foci of extraconal gas and hemorrhage within the inferior right orbit (for instance as seen on series 6, image 31). The globes are normal in size and contour. The extraocular muscles and optic nerve sheath complexes are symmetric and unremarkable. Sinuses: Near complete opacification of the right maxillary sinus with hyperdense fluid, likely reflecting blood products. Mild ethmoid and left maxillary sinus mucosal thickening. Small amount of frothy secretions within the left maxillary sinus. Soft tissues: Prominent right periorbital and pre maxillary soft tissue swelling. Small foci of gas within the soft tissues ventral and posterior to the maxillary sinus. CT CERVICAL SPINE FINDINGS Alignment: Reversal of the expected cervical lordosis. Trace C2-C3 and C3-C4 grade 1 anterolisthesis. Trace C4-C5 grade 1 retrolisthesis. Trace T1-T2 grade 1 anterolisthesis. Skull base and vertebrae: The basion-dental and atlanto-dental intervals are maintained.No evidence of acute fracture to the cervical spine. Small T2 superior endplate Schmorl node. Soft tissues and spinal canal: No prevertebral fluid or swelling. No visible canal hematoma. Disc levels: Spondylosis with multilevel disc space narrowing, posterior disc osteophytes, uncovertebral hypertrophy and facet arthrosis. Disc space narrowing is severe at C4-C5 and C6-C7. Upper chest: Reported separately. No consolidation within the imaged lung apices. No visible pneumothorax IMPRESSION: CT head: 1. No  evidence of acute intracranial abnormality. 2. Advanced cerebral white matter chronic small vessel ischemic disease 3. Mild cerebral atrophy. 4. Trace right mastoid effusion. CT maxillofacial: 1. Acute fractures of the lateral rim, lateral wall and likely inferior rim/floor of the right orbit as described. 2. Acute fractures of the anterior and posterolateral walls of the right maxillary sinus as described. 3. Prominent right periorbital and pre maxillary soft tissue swelling. 4. Small amount of extraconal gas and hemorrhage within the inferior right orbit. 5. Blood products near completely opacify the right maxillary sinus. CT cervical spine: 1. No evidence of acute fracture to the cervical spine. 2. Nonspecific reversal of the expected cervical lordosis. Mild multilevel grade 1 spondylolisthesis as detailed. 3. Cervical spondylosis as described. Electronically Signed   By: Kellie Simmering DO   On: 09/05/2020 12:55   CT Cervical Spine Wo Contrast  Result Date: 09/05/2020 CLINICAL DATA:  Head trauma, loss of consciousness. Facial trauma. Neck trauma. Additional history provided: Ground level fall, injury to right face/eye, TMJ pain. EXAM: CT HEAD WITHOUT CONTRAST CT MAXILLOFACIAL WITHOUT CONTRAST CT CERVICAL SPINE WITHOUT CONTRAST TECHNIQUE: Multidetector CT imaging of the head, cervical spine, and maxillofacial structures were performed using the standard protocol without intravenous contrast. Multiplanar CT image reconstructions of the cervical spine and maxillofacial structures were also generated. COMPARISON:  MRI/MRA head 08/19/2010. FINDINGS: CT HEAD FINDINGS Brain: Mild generalized cerebral atrophy. Advanced ill-defined hypoattenuation within the cerebral white matter is nonspecific, but compatible with chronic small vessel ischemic disease. There is no acute intracranial hemorrhage. No demarcated cortical infarct. No extra-axial fluid collection. No evidence of intracranial mass. No midline shift. Vascular:  No hyperdense vessel.  Atherosclerotic calcifications. Skull: Normal. Negative for fracture or focal lesion. Other: Trace fluid within the inferior right mastoid air cells. CT MAXILLOFACIAL FINDINGS Osseous: Minimally displaced acute fracture of the lateral right orbital rim at the expected location of the zygomaticofrontal suture (series 8, image 28). Acute, comminuted, mildly displaced fractures of the lateral wall of the right orbit (series 3, images 20-24). Acute comminuted and mildly displaced fractures of the posterolateral wall of the right maxillary sinus. Acute, comminuted and mildly displaced fractures of the anterior wall the right maxillary sinus. Suspect  associated nondisplaced acute fractures of the inferior right orbital rim and right orbital floor, involving the right infraorbital foramen. Orbits: There are tiny foci of extraconal gas and hemorrhage within the inferior right orbit (for instance as seen on series 6, image 31). The globes are normal in size and contour. The extraocular muscles and optic nerve sheath complexes are symmetric and unremarkable. Sinuses: Near complete opacification of the right maxillary sinus with hyperdense fluid, likely reflecting blood products. Mild ethmoid and left maxillary sinus mucosal thickening. Small amount of frothy secretions within the left maxillary sinus. Soft tissues: Prominent right periorbital and pre maxillary soft tissue swelling. Small foci of gas within the soft tissues ventral and posterior to the maxillary sinus. CT CERVICAL SPINE FINDINGS Alignment: Reversal of the expected cervical lordosis. Trace C2-C3 and C3-C4 grade 1 anterolisthesis. Trace C4-C5 grade 1 retrolisthesis. Trace T1-T2 grade 1 anterolisthesis. Skull base and vertebrae: The basion-dental and atlanto-dental intervals are maintained.No evidence of acute fracture to the cervical spine. Small T2 superior endplate Schmorl node. Soft tissues and spinal canal: No prevertebral fluid or  swelling. No visible canal hematoma. Disc levels: Spondylosis with multilevel disc space narrowing, posterior disc osteophytes, uncovertebral hypertrophy and facet arthrosis. Disc space narrowing is severe at C4-C5 and C6-C7. Upper chest: Reported separately. No consolidation within the imaged lung apices. No visible pneumothorax IMPRESSION: CT head: 1. No evidence of acute intracranial abnormality. 2. Advanced cerebral white matter chronic small vessel ischemic disease 3. Mild cerebral atrophy. 4. Trace right mastoid effusion. CT maxillofacial: 1. Acute fractures of the lateral rim, lateral wall and likely inferior rim/floor of the right orbit as described. 2. Acute fractures of the anterior and posterolateral walls of the right maxillary sinus as described. 3. Prominent right periorbital and pre maxillary soft tissue swelling. 4. Small amount of extraconal gas and hemorrhage within the inferior right orbit. 5. Blood products near completely opacify the right maxillary sinus. CT cervical spine: 1. No evidence of acute fracture to the cervical spine. 2. Nonspecific reversal of the expected cervical lordosis. Mild multilevel grade 1 spondylolisthesis as detailed. 3. Cervical spondylosis as described. Electronically Signed   By: Kellie Simmering DO   On: 09/05/2020 12:55   CT CHEST ABDOMEN PELVIS W CONTRAST  Result Date: 09/05/2020 CLINICAL DATA:  86 year old male with history of trauma from a fall earlier today. Patient is on anticoagulation. EXAM: CT CHEST, ABDOMEN, AND PELVIS WITH CONTRAST TECHNIQUE: Multidetector CT imaging of the chest, abdomen and pelvis was performed following the standard protocol during bolus administration of intravenous contrast. CONTRAST:  132mL OMNIPAQUE IOHEXOL 300 MG/ML  SOLN COMPARISON:  No prior chest CT. CT the abdomen and pelvis 12/06/2017. FINDINGS: CT CHEST FINDINGS Cardiovascular: Heart size is normal. There is no significant pericardial fluid, thickening or pericardial  calcification. No acute abnormality of the thoracic aorta. There is aortic atherosclerosis, as well as atherosclerosis of the great vessels of the mediastinum and the coronary arteries, including calcified atherosclerotic plaque in the left main, left anterior descending and left circumflex coronary arteries. Thickening and calcification of the aortic valve. Mediastinum/Nodes: No high attenuation fluid collection within the mediastinum to suggest significant posttraumatic hemorrhage. No pathologically enlarged mediastinal or hilar lymph nodes. Esophagus is unremarkable in appearance. No axillary lymphadenopathy. Lungs/Pleura: No pneumothorax. No acute consolidative airspace disease. No hemothorax or pleural effusions. Diffuse bronchial wall thickening with mild to moderate centrilobular and paraseptal emphysema. In addition, there are patchy areas of ground-glass attenuation, septal thickening, traction bronchiectasis and peripheral bronchiolectasis most evident throughout  the mid to lower lungs bilaterally, indicative of underlying interstitial lung disease. Musculoskeletal: No acute displaced fractures or aggressive appearing lytic or blastic lesions are noted in the visualized portions of the skeleton. CT ABDOMEN PELVIS FINDINGS Hepatobiliary: No evidence of significant acute traumatic injury to the liver. Multiple subcentimeter low-attenuation lesions scattered throughout the hepatic parenchyma, similar in size and number to the prior examination, statistically likely to represent cysts. No other larger suspicious appearing hepatic lesions. No intra or extrahepatic biliary ductal dilatation. Gallbladder is normal in appearance. Pancreas: No evidence of significant acute traumatic injury to the pancreas. No pancreatic mass. No pancreatic ductal dilatation. No pancreatic or peripancreatic fluid collections or inflammatory changes. Spleen: Unremarkable. Specifically, no evidence of acute traumatic injury to the  spleen. Adrenals/Urinary Tract: No evidence of acute traumatic injury to either kidney or adrenal gland. 1.6 cm exophytic low-attenuation lesion in the lower pole of the left kidney, compatible with a simple cyst. Right kidney and bilateral adrenal glands are normal in appearance. No hydroureteronephrosis. Urinary bladder is intact and normal in appearance. Stomach/Bowel: No definitive evidence of acute traumatic injury to the hollow viscera. The appearance of the stomach is normal. There is no pathologic dilatation of small bowel or colon. Numerous colonic diverticulae are noted, without surrounding inflammatory changes to suggests an acute diverticulitis at this time. The appendix is not confidently identified and may be surgically absent. Regardless, there are no inflammatory changes noted adjacent to the cecum to suggest the presence of an acute appendicitis at this time. Vascular/Lymphatic: Aortic atherosclerosis, without evidence of aneurysm or dissection in the abdominal or pelvic vasculature. No lymphadenopathy noted in the abdomen or pelvis. Reproductive: Prostate gland is severely enlarged with median lobe hypertrophy measuring 6.6 x 4.6 x 7.4 cm. Seminal vesicles are unremarkable in appearance. Other: No significant volume of ascites.  No pneumoperitoneum. Musculoskeletal: There are no acute displaced fractures or aggressive appearing lytic or blastic lesions noted in the visualized portions of the skeleton. IMPRESSION: 1. No definite evidence of significant acute traumatic injury to the chest, abdomen or pelvis. 2. Mild diffuse bronchial wall thickening with mild to moderate centrilobular and paraseptal emphysema; imaging findings suggestive of underlying COPD. 3. In addition, there is evidence of interstitial lung disease, with a spectrum of findings categorized as probable usual interstitial pneumonia (UIP) per current ATS guidelines. 4. Colonic diverticulosis without evidence of acute diverticulitis  at this time. 5. Aortic atherosclerosis, in addition to left main, left anterior descending and left circumflex coronary artery disease. 6. There are calcifications of the aortic valve. Echocardiographic correlation for evaluation of potential valvular dysfunction may be warranted if clinically indicated. 7. Prostatomegaly with median lobe hypertrophy. 8. Additional incidental findings, as above. Electronically Signed   By: Vinnie Langton M.D.   On: 09/05/2020 12:52   CT T-SPINE NO CHARGE  Result Date: 09/05/2020 CLINICAL DATA:  86 year old male status post ground level fall. Pain. EXAM: CT THORACIC SPINE WITH CONTRAST TECHNIQUE: Multiplanar CT images of the thoracic spine were reconstructed from contemporary CT of the Chest. CONTRAST:  None or No additional COMPARISON:  CT cervical, chest, Abdomen, and Pelvis today are reported separately. FINDINGS: Limited cervical spine imaging: Cervicothoracic junction alignment is within normal limits. Thoracic spine segmentation:  Normal. Alignment: Preserved thoracic kyphosis. No spondylolisthesis *CRASH * that there is subtle degenerative appearing anterolisthesis of T1 on T2, with associated chronic facet and endplate degeneration. No other spondylolisthesis. Vertebrae: No acute osseous abnormality identified. Visible posterior ribs also appear intact. There is ankylosis related  to anterior endplate osteophytes from the T6 to the T10 level. Possible developing facet ankylosis also on the left at T1-T2. Paraspinal and other soft tissues: Chest and upper abdominal viscera reported separately. Thoracic paraspinal soft tissues are within normal limits. Disc levels: Mild grade 1 anterolisthesis of T1 on T2 with moderate to severe disc space loss, and endplate spurring on the right. Moderate facet hypertrophy. However, evidence of developing left side facet ankylosis. Mild bilateral T1 foraminal stenosis. Elsewhere mild for age thoracic spine degeneration. No CT evidence of  thoracic spinal stenosis. IMPRESSION: 1. No acute traumatic injury identified in the thoracic spine. CT Chest, Abdomen, and Pelvis are reported separately. 2. Mild anterolisthesis at T1-T2 with disc and facet degeneration. Elsewhere mild for age thoracic spine degeneration. No CT evidence of thoracic spinal stenosis. Electronically Signed   By: Genevie Ann M.D.   On: 09/05/2020 12:43   CT L-SPINE NO CHARGE  Result Date: 09/05/2020 CLINICAL DATA:  86 year old male status post ground level fall. Pain. EXAM: CT LUMBAR SPINE WITH CONTRAST TECHNIQUE: Technique: Multiplanar CT images of the lumbar spine were reconstructed from contemporary CT of the Abdomen and Pelvis. CONTRAST:  No additional COMPARISON:  CT thoracic spine, chest, Abdomen, and Pelvis today are reported separately. CT Abdomen and Pelvis 12/06/2017. FINDINGS: Segmentation: Normal, concordant with the thoracic numbering today. Alignment: Straightening of lumbar lordosis. Mild dextroconvex lumbar scoliosis. No spondylolisthesis. Vertebrae: The lumbar vertebrae appear stable since 2019 and intact. Visible sacrum and SI joints also appear intact. Paraspinal and other soft tissues: Abdominal and pelvic viscera reported separately today. Lumbar paraspinal soft tissues remain normal. Disc levels: Chronic lumbar spine degeneration maximal at L2-L3, L4-L5, and L5-S1 does not appear significantly changed from the 2019 CT. There is moderate to severe multifactorial spinal stenosis suspected at L4-L5. IMPRESSION: 1. No acute traumatic injury identified in the lumbar spine. 2.  CT Abdomen and Pelvis today are reported separately. 3. 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 Electronically Signed   By: Genevie Ann M.D.   On: 09/05/2020 12:49   CT Maxillofacial Wo Contrast  Result Date: 09/05/2020 CLINICAL DATA:  Head trauma, loss of consciousness. Facial trauma. Neck  trauma. Additional history provided: Ground level fall, injury to right face/eye, TMJ pain. EXAM: CT HEAD WITHOUT CONTRAST CT MAXILLOFACIAL WITHOUT CONTRAST CT CERVICAL SPINE WITHOUT CONTRAST TECHNIQUE: Multidetector CT imaging of the head, cervical spine, and maxillofacial structures were performed using the standard protocol without intravenous contrast. Multiplanar CT image reconstructions of the cervical spine and maxillofacial structures were also generated. COMPARISON:  MRI/MRA head 08/19/2010. FINDINGS: CT HEAD FINDINGS Brain: Mild generalized cerebral atrophy. Advanced ill-defined hypoattenuation within the cerebral white matter is nonspecific, but compatible with chronic small vessel ischemic disease. There is no acute intracranial hemorrhage. No demarcated cortical infarct. No extra-axial fluid collection. No evidence of intracranial mass. No midline shift. Vascular: No hyperdense vessel.  Atherosclerotic calcifications. Skull: Normal. Negative for fracture or focal lesion. Other: Trace fluid within the inferior right mastoid air cells. CT MAXILLOFACIAL FINDINGS Osseous: Minimally displaced acute fracture of the lateral right orbital rim at the expected location of the zygomaticofrontal suture (series 8, image 28). Acute, comminuted, mildly displaced fractures of the lateral wall of the right orbit (series 3, images 20-24). Acute comminuted and mildly displaced fractures of the posterolateral wall of the right maxillary sinus. Acute, comminuted and mildly displaced fractures of the anterior wall the right maxillary sinus. Suspect associated nondisplaced  acute fractures of the inferior right orbital rim and right orbital floor, involving the right infraorbital foramen. Orbits: There are tiny foci of extraconal gas and hemorrhage within the inferior right orbit (for instance as seen on series 6, image 31). The globes are normal in size and contour. The extraocular muscles and optic nerve sheath complexes are  symmetric and unremarkable. Sinuses: Near complete opacification of the right maxillary sinus with hyperdense fluid, likely reflecting blood products. Mild ethmoid and left maxillary sinus mucosal thickening. Small amount of frothy secretions within the left maxillary sinus. Soft tissues: Prominent right periorbital and pre maxillary soft tissue swelling. Small foci of gas within the soft tissues ventral and posterior to the maxillary sinus. CT CERVICAL SPINE FINDINGS Alignment: Reversal of the expected cervical lordosis. Trace C2-C3 and C3-C4 grade 1 anterolisthesis. Trace C4-C5 grade 1 retrolisthesis. Trace T1-T2 grade 1 anterolisthesis. Skull base and vertebrae: The basion-dental and atlanto-dental intervals are maintained.No evidence of acute fracture to the cervical spine. Small T2 superior endplate Schmorl node. Soft tissues and spinal canal: No prevertebral fluid or swelling. No visible canal hematoma. Disc levels: Spondylosis with multilevel disc space narrowing, posterior disc osteophytes, uncovertebral hypertrophy and facet arthrosis. Disc space narrowing is severe at C4-C5 and C6-C7. Upper chest: Reported separately. No consolidation within the imaged lung apices. No visible pneumothorax IMPRESSION: CT head: 1. No evidence of acute intracranial abnormality. 2. Advanced cerebral white matter chronic small vessel ischemic disease 3. Mild cerebral atrophy. 4. Trace right mastoid effusion. CT maxillofacial: 1. Acute fractures of the lateral rim, lateral wall and likely inferior rim/floor of the right orbit as described. 2. Acute fractures of the anterior and posterolateral walls of the right maxillary sinus as described. 3. Prominent right periorbital and pre maxillary soft tissue swelling. 4. Small amount of extraconal gas and hemorrhage within the inferior right orbit. 5. Blood products near completely opacify the right maxillary sinus. CT cervical spine: 1. No evidence of acute fracture to the cervical  spine. 2. Nonspecific reversal of the expected cervical lordosis. Mild multilevel grade 1 spondylolisthesis as detailed. 3. Cervical spondylosis as described. Electronically Signed   By: Kellie Simmering DO   On: 09/05/2020 12:55    ____________________________________________   PROCEDURES  Procedure(s) performed (including Critical Care):  .1-3 Lead EKG Interpretation Performed by: Lucrezia Starch, MD Authorized by: Lucrezia Starch, MD     Interpretation: abnormal     ECG rate assessment: bradycardic     Rhythm: sinus bradycardia     Ectopy: none     Conduction: abnormal       ____________________________________________   INITIAL IMPRESSION / ASSESSMENT AND PLAN / ED COURSE        Patient presents above-stated exam for assessment of right eye pain after witnessed syncopal episode as described above.  Patient is hypertensive with a BP of 176/62 and bradycardic with heart rate of 35 otherwise stable vital signs on arrival.  Exam as above remarkable for right periorbital ecchymosis and tenderness.  There is also a small abrasion over the right eyebrow.  Patient is neurovascularly intact in all extremities and has no evidence of ocular entrapment.  Denies any visual complaints.  CTs as noted above evidence for multiple right-sided facial fractures.  I did discuss these findings with on-call otolaryngologist Dr. Richardson Landry who stated there was no indication for surgery for any of these fractures and that they could manage on outpatient basis.  Given concern for abrasion over right periorbital ecchymosis and possible open fracture  I also gave the patient 1 dose of Unasyn.  Remainder of CT scans including in the head, C-spine, chest, abdomen, and pelvis showed no evidence of fracture, dislocation, or visceral bleeding hemorrhage or other visceral injury.  I did review patient's medication list and note he is also on Flomax.  It could be that he syncopized from a side effect of this versus  symptomatic bradycardia.  CBC shows no evidence of anemia or significant derangements.  BMP shows a bicarb of 19 and a creatinine of 1.33 with no other significant derangements.  Patient's creatinine is 1.32 years ago and this does not appear to be an acute change.  UA has small hemoglobin 30 protein and is otherwise unremarkable.   Given concern for possible symptomatic bradycardia contributing to patient's presentation I did discuss this with on-call cardiologist Dr. Jose Persia who stated he would see the patient and make recommendations regarding future anticoagulation and possible placement of pacemaker.  Low suspicion for ACS at this time as patient denies any chest pain and his EKG appears very similar to prior although we will also plan to send a troponin.  Plan admit to patient to hospital service for further evaluation management.  Tetanus updated and below noted analgesia given.  Patient was also given a dose of his home blood pressure medication as his blood pressure was noted to be persistently high on several rechecks.  I will plan to admit to hospital service for further evaluation and management.  ____________________________________________   FINAL CLINICAL IMPRESSION(S) / ED DIAGNOSES  Final diagnoses:  Fall  Bradycardia  Multiple open fractures of facial bones, initial encounter ([place])    Medications  Tdap (BOOSTRIX) injection 0.5 mL (has no administration in time range)  ampicillin-sulbactam (UNASYN) 1.5 g in sodium chloride 0.9 % 100 mL IVPB (has no administration in time range)  iohexol (OMNIPAQUE) 300 MG/ML solution 100 mL (100 mLs Intravenous Contrast Given 09/05/20 1204)  losartan (COZAAR) tablet 25 mg (25 mg Oral Given 09/05/20 1336)  acetaminophen (TYLENOL) tablet 1,000 mg (1,000 mg Oral Given 09/05/20 1336)  lactated ringers bolus 500 mL (500 mLs Intravenous New Bag/Given 09/05/20 1329)     ED Discharge Orders    None       Note:  This document was prepared using  Dragon voice recognition software and may include unintentional dictation errors.   Lucrezia Starch, MD 09/05/20 [id number]

## 2020-09-05 NOTE — ED Notes (Signed)
Pt's daughter brought pt some pajama pants and his personal CPAP machine from home to use.

## 2020-09-05 NOTE — ED Notes (Signed)
Pt's CPAP set up at this time for him.

## 2020-09-06 DIAGNOSIS — I495 Sick sinus syndrome: Secondary | ICD-10-CM

## 2020-09-06 DIAGNOSIS — I498 Other specified cardiac arrhythmias: Secondary | ICD-10-CM

## 2020-09-06 LAB — SURGICAL PCR SCREEN
MRSA, PCR: NEGATIVE
Staphylococcus aureus: POSITIVE — AB

## 2020-09-06 LAB — ECHOCARDIOGRAM COMPLETE
Height: 72 in
S' Lateral: 2.78 cm
Weight: 2720 oz

## 2020-09-06 LAB — CBG MONITORING, ED: Glucose-Capillary: 85 mg/dL (ref 70–99)

## 2020-09-06 MED ORDER — CEFAZOLIN SODIUM-DEXTROSE 2-4 GM/100ML-% IV SOLN
2.0000 g | INTRAVENOUS | Status: AC
  Filled 2020-09-06: qty 100

## 2020-09-06 MED ORDER — LOSARTAN POTASSIUM 50 MG PO TABS: 25.0000 mg | ORAL_TABLET | Freq: Every day | ORAL | Status: DC

## 2020-09-06 MED ORDER — SODIUM CHLORIDE 0.9 % IV SOLN: INTRAVENOUS | Status: DC

## 2020-09-06 MED ORDER — SODIUM CHLORIDE 0.45 % IV SOLN: INTRAVENOUS | Status: DC

## 2020-09-06 NOTE — ED Notes (Signed)
Daughter, Coralyn Mark, called nursing station. Updated by this RN.

## 2020-09-06 NOTE — ED Notes (Signed)
Pt replacing batteries to hearing aides upon RN assessment btu then requested lights dimmed again so he could rest some more. Pt denies pain or further needs at this time.

## 2020-09-06 NOTE — Progress Notes (Addendum)
PROGRESS NOTE    [name]  TMA:[id number] DOB: 1928/06/20 DOA: 09/05/2020 PCP: Derinda Late, MD   Chief Complaint  Patient presents with  . Loss of Consciousness  . Fall    Brief Narrative:  Michael Gilbert is Michael Gilbert 86 y.o. male with medical history significant of atrial flutter on Eliquis and htn for syncope and collapse with fall on face with facial Fracture and cardiology Dr.Kowalski Consulted by edmd Dr.smith. Suspect pt had syncope related to his bph med or could be related to his bradycardia .  Assessment & Plan:   Principal Problem:   Syncope and collapse Active Problems:   HTN (hypertension), benign   GERD (gastroesophageal reflux disease)   OSA (obstructive sleep apnea)   Atrial flutter (HCC)   CKD (chronic kidney disease) stage 3, GFR 30-59 ml/min (HCC)  Atrial Flutter with Slow Ventricular Response  Sick Sinus Syndrome  Syncope likely 2/2 Bradycardia:  EKG with flutter with rate in 30's  Negative troponin x1 Echo with EF 55-60%, no RWMA (see report) Orthostatics pending Cardiology c/s, appreciate recommendations - planning for pacemaker placement  Atrial Flutter:  Currently rate controlled (bradycardic) Holding eliquis per cardiology recommendations with procedure Follow for resumption chadvasc 3 (age, HTN)  Hypertension: Hold losartan for now with recent syncopal event  Acute Fractures of the Lateral Rim, Lateral Wall, and Inferior Rim/Floor of R Orbit  Acute Fractures of the Anterior and Posterolateral Walls of the R Maxillary Sinus:  Discussed with Dr. Richardson Landry with ENT by EDP (Dr. Tamala Julian) who noted there was no indication for surgery, can be managed outpatient  OSA Discussed with Dr. Richardson Landry today, recommending holding CPAP for 7-10 days  Abnormal CT scan: findings suggestive of underlying COPD and interstitial lung disease -> needs outpatient follow up  DVT prophylaxis: SCD Code Status: (full  Family Communication: none at bedside Disposition:    Status is: Inpatient  Remains inpatient appropriate because:Inpatient level of care appropriate due to severity of illness   Dispo: The patient is from: Home              Anticipated d/c is to: pending              Anticipated d/c date is: > 3 days              Patient currently is not medically stable to d/c.   Consultants:   Cardiology  ENT over phone  Procedures:  Echo IMPRESSIONS    1. Left ventricular ejection fraction, by estimation, is 55 to 60%. The  left ventricle has normal function. The left ventricle has no regional  wall motion abnormalities. Left ventricular diastolic parameters were  normal.  2. Right ventricular systolic function is normal. The right ventricular  size is normal.  3. The mitral valve is normal in structure. Mild mitral valve  regurgitation.  4. The aortic valve is normal in structure. Aortic valve regurgitation is  trivial.   Antimicrobials:  Anti-infectives (From admission, onward)   Start     Dose/Rate Route Frequency Ordered Stop   09/06/20 1100  ceFAZolin (ANCEF) IVPB 2g/100 mL premix        2 g 200 mL/hr over 30 Minutes Intravenous On call 09/06/20 1055 09/07/20 1100   09/05/20 1400  ampicillin-sulbactam (UNASYN) 1.5 g in sodium chloride 0.9 % 100 mL IVPB        1.5 g 200 mL/hr over 30 Minutes Intravenous  Once 09/05/20 1317 09/05/20 1629     Subjective: No new  complaints Says he was walking and fell flat on his face  Objective: Vitals:   09/06/20 0800 09/06/20 0900 09/06/20 1000 09/06/20 1100  BP: 129/72 (!) 148/67 (!) 148/79 (!) 153/66  Pulse: (!) 42 69 (!) 58 (!) 41  Resp:    (!) 22  Temp:      TempSrc:      SpO2: 95% 94% 96% 94%  Weight:      Height:        Intake/Output Summary (Last 24 hours) at 09/06/2020 1156 Last data filed at 09/06/2020 1100 Gross per 24 hour  Intake 100 ml  Output 650 ml  Net -550 ml   Filed Weights   09/05/20 1110  Weight: 77.1 kg    Examination:  General exam: Appears  calm and comfortable  Respiratory system: Clear to auscultation. Respiratory effort normal. Cardiovascular system: bradycardic, regular rhythm  Gastrointestinal system: Abdomen is nondistended, soft and nontender. Central nervous system: Alert and oriented. No focal neurological deficits. Extremities: no LEE Skin: No rashes, lesions or ulcers Psychiatry: Judgement and insight appear normal. Mood & affect appropriate.     Data Reviewed: I have personally reviewed following labs and imaging studies  CBC: Recent Labs  Lab 09/05/20 1112  WBC 7.2  HGB 13.6  HCT 42.0  MCV 90.1  PLT 102    Basic Metabolic Panel: Recent Labs  Lab 09/05/20 1112  NA 135  K 4.2  CL 106  CO2 19*  GLUCOSE 111*  BUN 30*  CREATININE 1.33*  CALCIUM 8.6*    GFR: Estimated Creatinine Clearance: 40.3 mL/min (Johntavius Shepard) (by C-G formula based on SCr of 1.33 mg/dL (H)).  Liver Function Tests: Recent Labs  Lab 09/05/20 1325  AST 63*  ALT 29  ALKPHOS 83  BILITOT 0.9  PROT 7.9  ALBUMIN 3.7    CBG: Recent Labs  Lab 09/06/20 0618  GLUCAP 85     Recent Results (from the past 240 hour(s))  Respiratory Panel by RT PCR (Flu Mary-Anne Polizzi&B, Covid) - Nasopharyngeal Swab     Status: None   Collection Time: 09/05/20  1:25 PM   Specimen: Nasopharyngeal Swab  Result Value Ref Range Status   SARS Coronavirus 2 by RT PCR NEGATIVE NEGATIVE Final    Comment: (NOTE) SARS-CoV-2 target nucleic acids are NOT DETECTED.  The SARS-CoV-2 RNA is generally detectable in upper respiratoy specimens during the acute phase of infection. The lowest concentration of SARS-CoV-2 viral copies this assay can detect is 131 copies/mL. Parnika Tweten negative result does not preclude SARS-Cov-2 infection and should not be used as the sole basis for treatment or other patient management decisions. Linsie Lupo negative result may occur with  improper specimen collection/handling, submission of specimen other than nasopharyngeal swab, presence of viral mutation(s)  within the areas targeted by this assay, and inadequate number of viral copies (<131 copies/mL). Velia Pamer negative result must be combined with clinical observations, patient history, and epidemiological information. The expected result is Negative.  Fact Sheet for Patients:  PinkCheek.be  Fact Sheet for Healthcare Providers:  GravelBags.it  This test is no t yet approved or cleared by the [place] FDA and  has been authorized for detection and/or diagnosis of SARS-CoV-2 by FDA under an Emergency Use Authorization (EUA). This EUA will remain  in effect (meaning this test can be used) for the duration of the COVID-19 declaration under Section 564(b)(1) of the Act, 21 U.S.C. section 360bbb-3(b)(1), unless the authorization is terminated or revoked sooner.     Influenza Hassen Bruun by PCR  NEGATIVE NEGATIVE Final   Influenza B by PCR NEGATIVE NEGATIVE Final    Comment: (NOTE) The Xpert Xpress SARS-CoV-2/FLU/RSV assay is intended as an aid in  the diagnosis of influenza from Nasopharyngeal swab specimens and  should not be used as Ramiro Pangilinan sole basis for treatment. Nasal washings and  aspirates are unacceptable for Xpert Xpress SARS-CoV-2/FLU/RSV  testing.  Fact Sheet for Patients: PinkCheek.be  Fact Sheet for Healthcare Providers: GravelBags.it  This test is not yet approved or cleared by the [place] FDA and  has been authorized for detection and/or diagnosis of SARS-CoV-2 by  FDA under an Emergency Use Authorization (EUA). This EUA will remain  in effect (meaning this test can be used) for the duration of the  Covid-19 declaration under Section 564(b)(1) of the Act, 21  U.S.C. section 360bbb-3(b)(1), unless the authorization is  terminated or revoked. Performed at [facility], [address], [place], [place] [address]          Radiology Studies: CT HEAD WO  CONTRAST  Result Date: 09/05/2020 CLINICAL DATA:  Head trauma, loss of consciousness. Facial trauma. Neck trauma. Additional history provided: Ground level fall, injury to right face/eye, TMJ pain. EXAM: CT HEAD WITHOUT CONTRAST CT MAXILLOFACIAL WITHOUT CONTRAST CT CERVICAL SPINE WITHOUT CONTRAST TECHNIQUE: Multidetector CT imaging of the head, cervical spine, and maxillofacial structures were performed using the standard protocol without intravenous contrast. Multiplanar CT image reconstructions of the cervical spine and maxillofacial structures were also generated. COMPARISON:  MRI/MRA head 08/19/2010. FINDINGS: CT HEAD FINDINGS Brain: Mild generalized cerebral atrophy. Advanced ill-defined hypoattenuation within the cerebral white matter is nonspecific, but compatible with chronic small vessel ischemic disease. There is no acute intracranial hemorrhage. No demarcated cortical infarct. No extra-axial fluid collection. No evidence of intracranial mass. No midline shift. Vascular: No hyperdense vessel.  Atherosclerotic calcifications. Skull: Normal. Negative for fracture or focal lesion. Other: Trace fluid within the inferior right mastoid air cells. CT MAXILLOFACIAL FINDINGS Osseous: Minimally displaced acute fracture of the lateral right orbital rim at the expected location of the zygomaticofrontal suture (series 8, image 28). Acute, comminuted, mildly displaced fractures of the lateral wall of the right orbit (series 3, images 20-24). Acute comminuted and mildly displaced fractures of the posterolateral wall of the right maxillary sinus. Acute, comminuted and mildly displaced fractures of the anterior wall the right maxillary sinus. Suspect associated nondisplaced acute fractures of the inferior right orbital rim and right orbital floor, involving the right infraorbital foramen. Orbits: There are tiny foci of extraconal gas and hemorrhage within the inferior right orbit (for instance as seen on series 6, image  31). The globes are normal in size and contour. The extraocular muscles and optic nerve sheath complexes are symmetric and unremarkable. Sinuses: Near complete opacification of the right maxillary sinus with hyperdense fluid, likely reflecting blood products. Mild ethmoid and left maxillary sinus mucosal thickening. Small amount of frothy secretions within the left maxillary sinus. Soft tissues: Prominent right periorbital and pre maxillary soft tissue swelling. Small foci of gas within the soft tissues ventral and posterior to the maxillary sinus. CT CERVICAL SPINE FINDINGS Alignment: Reversal of the expected cervical lordosis. Trace C2-C3 and C3-C4 grade 1 anterolisthesis. Trace C4-C5 grade 1 retrolisthesis. Trace T1-T2 grade 1 anterolisthesis. Skull base and vertebrae: The basion-dental and atlanto-dental intervals are maintained.No evidence of acute fracture to the cervical spine. Small T2 superior endplate Schmorl node. Soft tissues and spinal canal: No prevertebral fluid or swelling. No visible canal hematoma. Disc levels: Spondylosis  with multilevel disc space narrowing, posterior disc osteophytes, uncovertebral hypertrophy and facet arthrosis. Disc space narrowing is severe at C4-C5 and C6-C7. Upper chest: Reported separately. No consolidation within the imaged lung apices. No visible pneumothorax IMPRESSION: CT head: 1. No evidence of acute intracranial abnormality. 2. Advanced cerebral white matter chronic small vessel ischemic disease 3. Mild cerebral atrophy. 4. Trace right mastoid effusion. CT maxillofacial: 1. Acute fractures of the lateral rim, lateral wall and likely inferior rim/floor of the right orbit as described. 2. Acute fractures of the anterior and posterolateral walls of the right maxillary sinus as described. 3. Prominent right periorbital and pre maxillary soft tissue swelling. 4. Small amount of extraconal gas and hemorrhage within the inferior right orbit. 5. Blood products near  completely opacify the right maxillary sinus. CT cervical spine: 1. No evidence of acute fracture to the cervical spine. 2. Nonspecific reversal of the expected cervical lordosis. Mild multilevel grade 1 spondylolisthesis as detailed. 3. Cervical spondylosis as described. Electronically Signed   By: Kellie Simmering DO   On: 09/05/2020 12:55   CT Cervical Spine Wo Contrast  Result Date: 09/05/2020 CLINICAL DATA:  Head trauma, loss of consciousness. Facial trauma. Neck trauma. Additional history provided: Ground level fall, injury to right face/eye, TMJ pain. EXAM: CT HEAD WITHOUT CONTRAST CT MAXILLOFACIAL WITHOUT CONTRAST CT CERVICAL SPINE WITHOUT CONTRAST TECHNIQUE: Multidetector CT imaging of the head, cervical spine, and maxillofacial structures were performed using the standard protocol without intravenous contrast. Multiplanar CT image reconstructions of the cervical spine and maxillofacial structures were also generated. COMPARISON:  MRI/MRA head 08/19/2010. FINDINGS: CT HEAD FINDINGS Brain: Mild generalized cerebral atrophy. Advanced ill-defined hypoattenuation within the cerebral white matter is nonspecific, but compatible with chronic small vessel ischemic disease. There is no acute intracranial hemorrhage. No demarcated cortical infarct. No extra-axial fluid collection. No evidence of intracranial mass. No midline shift. Vascular: No hyperdense vessel.  Atherosclerotic calcifications. Skull: Normal. Negative for fracture or focal lesion. Other: Trace fluid within the inferior right mastoid air cells. CT MAXILLOFACIAL FINDINGS Osseous: Minimally displaced acute fracture of the lateral right orbital rim at the expected location of the zygomaticofrontal suture (series 8, image 28). Acute, comminuted, mildly displaced fractures of the lateral wall of the right orbit (series 3, images 20-24). Acute comminuted and mildly displaced fractures of the posterolateral wall of the right maxillary sinus. Acute,  comminuted and mildly displaced fractures of the anterior wall the right maxillary sinus. Suspect associated nondisplaced acute fractures of the inferior right orbital rim and right orbital floor, involving the right infraorbital foramen. Orbits: There are tiny foci of extraconal gas and hemorrhage within the inferior right orbit (for instance as seen on series 6, image 31). The globes are normal in size and contour. The extraocular muscles and optic nerve sheath complexes are symmetric and unremarkable. Sinuses: Near complete opacification of the right maxillary sinus with hyperdense fluid, likely reflecting blood products. Mild ethmoid and left maxillary sinus mucosal thickening. Small amount of frothy secretions within the left maxillary sinus. Soft tissues: Prominent right periorbital and pre maxillary soft tissue swelling. Small foci of gas within the soft tissues ventral and posterior to the maxillary sinus. CT CERVICAL SPINE FINDINGS Alignment: Reversal of the expected cervical lordosis. Trace C2-C3 and C3-C4 grade 1 anterolisthesis. Trace C4-C5 grade 1 retrolisthesis. Trace T1-T2 grade 1 anterolisthesis. Skull base and vertebrae: The basion-dental and atlanto-dental intervals are maintained.No evidence of acute fracture to the cervical spine. Small T2 superior endplate Schmorl node. Soft tissues and spinal  canal: No prevertebral fluid or swelling. No visible canal hematoma. Disc levels: Spondylosis with multilevel disc space narrowing, posterior disc osteophytes, uncovertebral hypertrophy and facet arthrosis. Disc space narrowing is severe at C4-C5 and C6-C7. Upper chest: Reported separately. No consolidation within the imaged lung apices. No visible pneumothorax IMPRESSION: CT head: 1. No evidence of acute intracranial abnormality. 2. Advanced cerebral white matter chronic small vessel ischemic disease 3. Mild cerebral atrophy. 4. Trace right mastoid effusion. CT maxillofacial: 1. Acute fractures of the  lateral rim, lateral wall and likely inferior rim/floor of the right orbit as described. 2. Acute fractures of the anterior and posterolateral walls of the right maxillary sinus as described. 3. Prominent right periorbital and pre maxillary soft tissue swelling. 4. Small amount of extraconal gas and hemorrhage within the inferior right orbit. 5. Blood products near completely opacify the right maxillary sinus. CT cervical spine: 1. No evidence of acute fracture to the cervical spine. 2. Nonspecific reversal of the expected cervical lordosis. Mild multilevel grade 1 spondylolisthesis as detailed. 3. Cervical spondylosis as described. Electronically Signed   By: Kellie Simmering DO   On: 09/05/2020 12:55   CT CHEST ABDOMEN PELVIS W CONTRAST  Result Date: 09/05/2020 CLINICAL DATA:  86 year old male with history of trauma from Jarelyn Bambach fall earlier today. Patient is on anticoagulation. EXAM: CT CHEST, ABDOMEN, AND PELVIS WITH CONTRAST TECHNIQUE: Multidetector CT imaging of the chest, abdomen and pelvis was performed following the standard protocol during bolus administration of intravenous contrast. CONTRAST:  120mL OMNIPAQUE IOHEXOL 300 MG/ML  SOLN COMPARISON:  No prior chest CT. CT the abdomen and pelvis 12/06/2017. FINDINGS: CT CHEST FINDINGS Cardiovascular: Heart size is normal. There is no significant pericardial fluid, thickening or pericardial calcification. No acute abnormality of the thoracic aorta. There is aortic atherosclerosis, as well as atherosclerosis of the great vessels of the mediastinum and the coronary arteries, including calcified atherosclerotic plaque in the left main, left anterior descending and left circumflex coronary arteries. Thickening and calcification of the aortic valve. Mediastinum/Nodes: No high attenuation fluid collection within the mediastinum to suggest significant posttraumatic hemorrhage. No pathologically enlarged mediastinal or hilar lymph nodes. Esophagus is unremarkable in  appearance. No axillary lymphadenopathy. Lungs/Pleura: No pneumothorax. No acute consolidative airspace disease. No hemothorax or pleural effusions. Diffuse bronchial wall thickening with mild to moderate centrilobular and paraseptal emphysema. In addition, there are patchy areas of ground-glass attenuation, septal thickening, traction bronchiectasis and peripheral bronchiolectasis most evident throughout the mid to lower lungs bilaterally, indicative of underlying interstitial lung disease. Musculoskeletal: No acute displaced fractures or aggressive appearing lytic or blastic lesions are noted in the visualized portions of the skeleton. CT ABDOMEN PELVIS FINDINGS Hepatobiliary: No evidence of significant acute traumatic injury to the liver. Multiple subcentimeter low-attenuation lesions scattered throughout the hepatic parenchyma, similar in size and number to the prior examination, statistically likely to represent cysts. No other larger suspicious appearing hepatic lesions. No intra or extrahepatic biliary ductal dilatation. Gallbladder is normal in appearance. Pancreas: No evidence of significant acute traumatic injury to the pancreas. No pancreatic mass. No pancreatic ductal dilatation. No pancreatic or peripancreatic fluid collections or inflammatory changes. Spleen: Unremarkable. Specifically, no evidence of acute traumatic injury to the spleen. Adrenals/Urinary Tract: No evidence of acute traumatic injury to either kidney or adrenal gland. 1.6 cm exophytic low-attenuation lesion in the lower pole of the left kidney, compatible with Morene Cecilio simple cyst. Right kidney and bilateral adrenal glands are normal in appearance. No hydroureteronephrosis. Urinary bladder is intact and normal in  appearance. Stomach/Bowel: No definitive evidence of acute traumatic injury to the hollow viscera. The appearance of the stomach is normal. There is no pathologic dilatation of small bowel or colon. Numerous colonic diverticulae are  noted, without surrounding inflammatory changes to suggests an acute diverticulitis at this time. The appendix is not confidently identified and may be surgically absent. Regardless, there are no inflammatory changes noted adjacent to the cecum to suggest the presence of an acute appendicitis at this time. Vascular/Lymphatic: Aortic atherosclerosis, without evidence of aneurysm or dissection in the abdominal or pelvic vasculature. No lymphadenopathy noted in the abdomen or pelvis. Reproductive: Prostate gland is severely enlarged with median lobe hypertrophy measuring 6.6 x 4.6 x 7.4 cm. Seminal vesicles are unremarkable in appearance. Other: No significant volume of ascites.  No pneumoperitoneum. Musculoskeletal: There are no acute displaced fractures or aggressive appearing lytic or blastic lesions noted in the visualized portions of the skeleton. IMPRESSION: 1. No definite evidence of significant acute traumatic injury to the chest, abdomen or pelvis. 2. Mild diffuse bronchial wall thickening with mild to moderate centrilobular and paraseptal emphysema; imaging findings suggestive of underlying COPD. 3. In addition, there is evidence of interstitial lung disease, with Janyth Riera spectrum of findings categorized as probable usual interstitial pneumonia (UIP) per current ATS guidelines. 4. Colonic diverticulosis without evidence of acute diverticulitis at this time. 5. Aortic atherosclerosis, in addition to left main, left anterior descending and left circumflex coronary artery disease. 6. There are calcifications of the aortic valve. Echocardiographic correlation for evaluation of potential valvular dysfunction may be warranted if clinically indicated. 7. Prostatomegaly with median lobe hypertrophy. 8. Additional incidental findings, as above. Electronically Signed   By: Vinnie Langton M.D.   On: 09/05/2020 12:52   CT T-SPINE NO CHARGE  Result Date: 09/05/2020 CLINICAL DATA:  86 year old male status post ground level  fall. Pain. EXAM: CT THORACIC SPINE WITH CONTRAST TECHNIQUE: Multiplanar CT images of the thoracic spine were reconstructed from contemporary CT of the Chest. CONTRAST:  None or No additional COMPARISON:  CT cervical, chest, Abdomen, and Pelvis today are reported separately. FINDINGS: Limited cervical spine imaging: Cervicothoracic junction alignment is within normal limits. Thoracic spine segmentation:  Normal. Alignment: Preserved thoracic kyphosis. No spondylolisthesis *CRASH * that there is subtle degenerative appearing anterolisthesis of T1 on T2, with associated chronic facet and endplate degeneration. No other spondylolisthesis. Vertebrae: No acute osseous abnormality identified. Visible posterior ribs also appear intact. There is ankylosis related to anterior endplate osteophytes from the T6 to the T10 level. Possible developing facet ankylosis also on the left at T1-T2. Paraspinal and other soft tissues: Chest and upper abdominal viscera reported separately. Thoracic paraspinal soft tissues are within normal limits. Disc levels: Mild grade 1 anterolisthesis of T1 on T2 with moderate to severe disc space loss, and endplate spurring on the right. Moderate facet hypertrophy. However, evidence of developing left side facet ankylosis. Mild bilateral T1 foraminal stenosis. Elsewhere mild for age thoracic spine degeneration. No CT evidence of thoracic spinal stenosis. IMPRESSION: 1. No acute traumatic injury identified in the thoracic spine. CT Chest, Abdomen, and Pelvis are reported separately. 2. Mild anterolisthesis at T1-T2 with disc and facet degeneration. Elsewhere mild for age thoracic spine degeneration. No CT evidence of thoracic spinal stenosis. Electronically Signed   By: Genevie Ann M.D.   On: 09/05/2020 12:43   CT L-SPINE NO CHARGE  Result Date: 09/05/2020 CLINICAL DATA:  86 year old male status post ground level fall. Pain. EXAM: CT LUMBAR SPINE WITH CONTRAST TECHNIQUE: Technique:  Multiplanar CT  images of the lumbar spine were reconstructed from contemporary CT of the Abdomen and Pelvis. CONTRAST:  No additional COMPARISON:  CT thoracic spine, chest, Abdomen, and Pelvis today are reported separately. CT Abdomen and Pelvis 12/06/2017. FINDINGS: Segmentation: Normal, concordant with the thoracic numbering today. Alignment: Straightening of lumbar lordosis. Mild dextroconvex lumbar scoliosis. No spondylolisthesis. Vertebrae: The lumbar vertebrae appear stable since 2019 and intact. Visible sacrum and SI joints also appear intact. Paraspinal and other soft tissues: Abdominal and pelvic viscera reported separately today. Lumbar paraspinal soft tissues remain normal. Disc levels: Chronic lumbar spine degeneration maximal at L2-L3, L4-L5, and L5-S1 does not appear significantly changed from the 2019 CT. There is moderate to severe multifactorial spinal stenosis suspected at L4-L5. IMPRESSION: 1. No acute traumatic injury identified in the lumbar spine. 2.  CT Abdomen and Pelvis today are reported separately. 3. 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 Electronically Signed   By: Genevie Ann M.D.   On: 09/05/2020 12:49   ECHOCARDIOGRAM COMPLETE  Result Date: 09/06/2020    ECHOCARDIOGRAM REPORT   Patient Name:   DELMONT PROSCH [facility] Date of Exam: 09/05/2020 Medical Rec #:  [id number]     Height:       72.0 in Accession #:    [id number]    Weight:       170.0 lb Date of Birth:  1927/12/26      BSA:          1.988 m Patient Age:    84 years      BP:           106/90 mmHg Patient Gender: M             HR:           58 bpm. Exam Location:  [facility] Procedure: 2D Echo, Cardiac Doppler and Color Doppler Indications:     R55 Syncope  History:         Patient has no prior history of Echocardiogram examinations.                  Risk Factors:Hypertension. Bradycardia.  Sonographer:     Wilford Sports Rodgers-Jones Referring Phys:  [place] Diagnosing Phys: Serafina Royals MD IMPRESSIONS  1. Left ventricular ejection fraction, by estimation, is 55 to 60%. The left ventricle has normal function. The left ventricle has no regional wall motion abnormalities. Left ventricular diastolic parameters were normal.  2. Right ventricular systolic function is normal. The right ventricular size is normal.  3. The mitral valve is normal in structure. Mild mitral valve regurgitation.  4. The aortic valve is normal in structure. Aortic valve regurgitation is trivial. FINDINGS  Left Ventricle: Left ventricular ejection fraction, by estimation, is 55 to 60%. The left ventricle has normal function. The left ventricle has no regional wall motion abnormalities. The left ventricular internal cavity size was normal in size. There is  no left ventricular hypertrophy. Left ventricular diastolic parameters were normal. Right Ventricle: The right ventricular size is normal. No increase in right ventricular wall thickness. Right ventricular systolic function is normal. Left Atrium: Left atrial size was normal in size. Right Atrium: Right atrial size was normal in size. Pericardium: There is no evidence of pericardial effusion. Mitral Valve: The mitral valve is normal in structure. Mild mitral valve regurgitation. Tricuspid Valve: The tricuspid valve is normal in structure. Tricuspid valve regurgitation is mild. Aortic Valve:  The aortic valve is normal in structure. Aortic valve regurgitation is trivial. Pulmonic Valve: The pulmonic valve was normal in structure. Pulmonic valve regurgitation is not visualized. Aorta: The aortic root and ascending aorta are structurally normal, with no evidence of dilitation. IAS/Shunts: No atrial level shunt detected by color flow Doppler.  LEFT VENTRICLE PLAX 2D LVIDd:         4.69 cm LVIDs:         2.78 cm LV PW:         0.81 cm LV IVS:        0.83 cm LVOT diam:     2.30 cm LV SV:         90 LV SV Index:   45 LVOT Area:     4.15 cm  RIGHT  VENTRICLE RV Basal diam:  5.22 cm RV S prime:     21.05 cm/s TAPSE (M-mode): 4.0 cm LEFT ATRIUM              Index       RIGHT ATRIUM           Index LA diam:        5.40 cm  2.72 cm/m  RA Area:     27.10 cm LA Vol (A2C):   119.0 ml 59.85 ml/m RA Volume:   102.00 ml 51.30 ml/m LA Vol (A4C):   101.0 ml 50.80 ml/m LA Biplane Vol: 110.0 ml 55.32 ml/m  AORTIC VALVE LVOT Vmax:   99.95 cm/s LVOT Vmean:  72.950 cm/s LVOT VTI:    0.217 m  AORTA Ao Root diam: 3.90 cm Ao Asc diam:  4.30 cm MV E velocity: 89.97 cm/s  TRICUSPID VALVE                            TR Peak grad:   46.5 mmHg                            TR Vmax:        341.00 cm/s                             SHUNTS                            Systemic VTI:  0.22 m                            Systemic Diam: 2.30 cm Serafina Royals MD Electronically signed by Serafina Royals MD Signature Date/Time: 09/06/2020/7:16:58 AM    Final    CT Maxillofacial Wo Contrast  Result Date: 09/05/2020 CLINICAL DATA:  Head trauma, loss of consciousness. Facial trauma. Neck trauma. Additional history provided: Ground level fall, injury to right face/eye, TMJ pain. EXAM: CT HEAD WITHOUT CONTRAST CT MAXILLOFACIAL WITHOUT CONTRAST CT CERVICAL SPINE WITHOUT CONTRAST TECHNIQUE: Multidetector CT imaging of the head, cervical spine, and maxillofacial structures were performed using the standard protocol without intravenous contrast. Multiplanar CT image reconstructions of the cervical spine and maxillofacial structures were also generated. COMPARISON:  MRI/MRA head 08/19/2010. FINDINGS: CT HEAD FINDINGS Brain: Mild generalized cerebral atrophy. Advanced ill-defined hypoattenuation within the cerebral white matter is nonspecific, but compatible with chronic small vessel ischemic disease. There is no acute intracranial hemorrhage. No demarcated cortical infarct. No extra-axial  fluid collection. No evidence of intracranial mass. No midline shift. Vascular: No hyperdense vessel.  Atherosclerotic  calcifications. Skull: Normal. Negative for fracture or focal lesion. Other: Trace fluid within the inferior right mastoid air cells. CT MAXILLOFACIAL FINDINGS Osseous: Minimally displaced acute fracture of the lateral right orbital rim at the expected location of the zygomaticofrontal suture (series 8, image 28). Acute, comminuted, mildly displaced fractures of the lateral wall of the right orbit (series 3, images 20-24). Acute comminuted and mildly displaced fractures of the posterolateral wall of the right maxillary sinus. Acute, comminuted and mildly displaced fractures of the anterior wall the right maxillary sinus. Suspect associated nondisplaced acute fractures of the inferior right orbital rim and right orbital floor, involving the right infraorbital foramen. Orbits: There are tiny foci of extraconal gas and hemorrhage within the inferior right orbit (for instance as seen on series 6, image 31). The globes are normal in size and contour. The extraocular muscles and optic nerve sheath complexes are symmetric and unremarkable. Sinuses: Near complete opacification of the right maxillary sinus with hyperdense fluid, likely reflecting blood products. Mild ethmoid and left maxillary sinus mucosal thickening. Small amount of frothy secretions within the left maxillary sinus. Soft tissues: Prominent right periorbital and pre maxillary soft tissue swelling. Small foci of gas within the soft tissues ventral and posterior to the maxillary sinus. CT CERVICAL SPINE FINDINGS Alignment: Reversal of the expected cervical lordosis. Trace C2-C3 and C3-C4 grade 1 anterolisthesis. Trace C4-C5 grade 1 retrolisthesis. Trace T1-T2 grade 1 anterolisthesis. Skull base and vertebrae: The basion-dental and atlanto-dental intervals are maintained.No evidence of acute fracture to the cervical spine. Small T2 superior endplate Schmorl node. Soft tissues and spinal canal: No prevertebral fluid or swelling. No visible canal hematoma. Disc  levels: Spondylosis with multilevel disc space narrowing, posterior disc osteophytes, uncovertebral hypertrophy and facet arthrosis. Disc space narrowing is severe at C4-C5 and C6-C7. Upper chest: Reported separately. No consolidation within the imaged lung apices. No visible pneumothorax IMPRESSION: CT head: 1. No evidence of acute intracranial abnormality. 2. Advanced cerebral white matter chronic small vessel ischemic disease 3. Mild cerebral atrophy. 4. Trace right mastoid effusion. CT maxillofacial: 1. Acute fractures of the lateral rim, lateral wall and likely inferior rim/floor of the right orbit as described. 2. Acute fractures of the anterior and posterolateral walls of the right maxillary sinus as described. 3. Prominent right periorbital and pre maxillary soft tissue swelling. 4. Small amount of extraconal gas and hemorrhage within the inferior right orbit. 5. Blood products near completely opacify the right maxillary sinus. CT cervical spine: 1. No evidence of acute fracture to the cervical spine. 2. Nonspecific reversal of the expected cervical lordosis. Mild multilevel grade 1 spondylolisthesis as detailed. 3. Cervical spondylosis as described. Electronically Signed   By: Kellie Simmering DO   On: 09/05/2020 12:55        Scheduled Meds: . pantoprazole (PROTONIX) IV  40 mg Intravenous Q24H  . sodium chloride flush  3 mL Intravenous Q12H  . tamsulosin  0.8 mg Oral Daily  . Tdap  0.5 mL Intramuscular Once   Continuous Infusions: . sodium chloride    . sodium chloride 50 mL/hr at 09/05/20 1547  . sodium chloride    .  ceFAZolin (ANCEF) IV       LOS: 1 day    Time spent: over 30 min    Fayrene Helper, MD [facility]   To contact the attending provider between 7A-7P or the covering provider during after hours  7P-7A, please log into the web site [email or web address] and access using universal [place] password for that web site. If you do not have the password, please call the  hospital operator.  09/06/2020, 11:56 AM

## 2020-09-06 NOTE — ED Notes (Signed)
RN to bedside to administer tylenol per pt request. Pt sleeping at this time. Will continue to assess. NAD noted. Pt wearing CPAP.

## 2020-09-06 NOTE — ED Notes (Addendum)
Cardiologist at bedside.  

## 2020-09-06 NOTE — Progress Notes (Signed)
Patient heart rate dropped to 37-38 bpm.  Patient is asymptomatic.  Scheduled for pacemaker placement tomorrow.  Will continue to monitor.

## 2020-09-06 NOTE — Progress Notes (Signed)
Patient transferred to room 260. Report called to Anderson [place], [place]

## 2020-09-06 NOTE — Consult Note (Signed)
[facility] Consultation Note  Patient ID: Michael Gilbert, MRN: [id number], DOB/AGE: 1928/04/12 86 y.o. Admit date: 09/05/2020   Date of Consult: 09/06/2020 Primary Physician: Derinda Late, MD Primary Cardiologist: [facility]  Chief Complaint:  Chief Complaint  Patient presents with  . Loss of Consciousness  . Fall   Reason for Consult: Symptomatic bradycardia with syncope  HPI: 86 y.o. male with known hypertension hyperlipidemia chronic obstructive pulmonary disease with chronic atrial flutter with slow ventricular rate.  The patient has been on appropriate medication management for further risk reduction in stroke including anticoagulation.  The patient has had some bradycardia recently for which she has not had any significant symptoms.  This bradycardia was not due to medication management due to the patient has not had any medications lowering his heart rate for years.  With last recent visit the patient had bradycardia to 50 bpm.  He has been very active with no evidence of significant anginal symptoms or congestive heart failure.  He recently had of been walking and had an episode of syncope without warning.  This occurred with no evidence of shortness of breath chest pain or other concerns.  When seen in the emergency room the patient had an EKG showing atrial flutter with slow ventricular rate at 35 bpm.  He occasionally has had preventricular contractions which could have exacerbated this.  Troponin was 10 and glomerular filtration rate 51.  Currently he continues to have slow ventricular rate of atrial flutter consistent with the primary cause of his syncope.  With further discussion the patient understands that this is likely the cause of his syncope and will need leadless micropacemaker placement.  This will treatment will help with the current issues.  He understands the risk and benefits of this particular.  Treatment this includes a possibility death stroke heart attack  infection bleeding blood clot and infection and cardiac perforation.  He is at low risk for moderate sedation  Past Medical History:  Diagnosis Date  . Allergy   . Benign essential hypertension 06/23/2014  . Bradycardia   . Colorectal polyps   . Diverticulosis   . GERD (gastroesophageal reflux disease)   . Hypertension       Surgical History:  Past Surgical History:  Procedure Laterality Date  . CATARACT EXTRACTION    . COLONOSCOPY WITH PROPOFOL N/A 12/06/2017   Procedure: COLONOSCOPY WITH PROPOFOL;  Surgeon: Lin Landsman, MD;  Location: [facility] ENDOSCOPY;  Service: Gastroenterology;  Laterality: N/A;  . ESOPHAGOGASTRODUODENOSCOPY (EGD) WITH PROPOFOL N/A 01/01/2018   Procedure: ESOPHAGOGASTRODUODENOSCOPY (EGD) WITH PROPOFOL;  Surgeon: Lin Landsman, MD;  Location[facility] ENDOSCOPY;  Service: Gastroenterology;  Laterality: N/A;  . TONSILECTOMY, ADENOIDECTOMY, BILATERAL MYRINGOTOMY AND TUBES       Home Meds: Prior to Admission medications   Medication Sig Start Date End Date Taking? Authorizing Provider  ELIQUIS 2.5 MG TABS tablet SMARTSIG:1 Tablet(s) By Mouth Every 12 Hours 12/07/19  Yes [provider]  fluticasone (FLONASE) 50 MCG/ACT nasal spray Place 1 spray into both nostrils daily.  07/03/17  Yes [provider]  losartan (COZAAR) 25 MG tablet Take 25 mg by mouth daily.  04/07/19  Yes [provider]  Multiple Vitamin (MULTI-VITAMINS) TABS Take 1 tablet by mouth daily.    Yes [provider]  omeprazole (PRILOSEC) 40 MG capsule Take 40 mg by mouth daily.  03/06/18  Yes [provider]  tamsulosin (FLOMAX) 0.4 MG CAPS capsule TAKE 2 CAPSULES EVERY DAY 08/20/20  Yes Ernestine Conrad, Larene Beach  A, PA-C    Inpatient Medications:  . pantoprazole (PROTONIX) IV  40 mg Intravenous Q24H  . sodium chloride flush  3 mL Intravenous Q12H  . tamsulosin  0.8 mg Oral Daily  . Tdap  0.5 mL Intramuscular Once   . sodium chloride 50 mL/hr at 09/05/20 1547     Allergies:  Allergies  Allergen Reactions  . Ace Inhibitors Cough  . Lovastatin Other (See Comments)    Other reaction(s): Muscle Pain  . Simvastatin     Other reaction(s): Muscle Pain    Social History   Socioeconomic History  . Marital status: Married    Spouse name: Not on file  . Number of children: Not on file  . Years of education: Not on file  . Highest education level: Not on file  Occupational History  . Not on file  Tobacco Use  . Smoking status: Former Research scientist (life sciences)  . Smokeless tobacco: Never Used  Vaping Use  . Vaping Use: Never used  Substance and Sexual Activity  . Alcohol use: Yes    Alcohol/week: 1.0 standard drink    Types: 1 Glasses of wine per week    Comment: 1 glass of wine daily  . Drug use: No  . Sexual activity: Not on file  Other Topics Concern  . Not on file  Social History Narrative  . Not on file   Social Determinants of Health   Financial Resource Strain:   . Difficulty of Paying Living Expenses: Not on file  Food Insecurity:   . Worried About Charity fundraiser in the Last Year: Not on file  . Ran Out of Food in the Last Year: Not on file  Transportation Needs:   . Lack of Transportation (Medical): Not on file  . Lack of Transportation (Non-Medical): Not on file  Physical Activity:   . Days of Exercise per Week: Not on file  . Minutes of Exercise per Session: Not on file  Stress:   . Feeling of Stress : Not on file  Social Connections:   . Frequency of Communication with Friends and Family: Not on file  . Frequency of Social Gatherings with Friends and Family: Not on file  . Attends Religious Services: Not on file  . Active Member of Clubs or Organizations: Not on file  . Attends Archivist Meetings: Not on file  . Marital Status: Not on file  Intimate Partner Violence:   . Fear of Current or Ex-Partner: Not on file  . Emotionally Abused: Not on file  . Physically Abused: Not on file  . Sexually Abused: Not on  file     Family History  Problem Relation Age of Onset  . Diabetes Father   . Diabetes Brother   . Bladder Cancer Neg Hx   . Kidney cancer Neg Hx   . Prostate cancer Neg Hx      Review of Systems Positive for syncope Negative for: General:  chills, fever, night sweats or weight changes.  Cardiovascular: PND orthopnea positive for syncope dizziness  Dermatological skin lesions rashes Respiratory: Cough congestion Urologic: Frequent urination urination at night and hematuria Abdominal: negative for nausea, vomiting, diarrhea, bright red blood per rectum, melena, or hematemesis Neurologic: negative for visual changes, and/or hearing changes  All other systems reviewed and are otherwise negative except as noted above.  Labs: No results for input(s): CKTOTAL, CKMB, TROPONINI in the last 72 hours. Lab Results  Component Value Date   WBC 7.2 09/05/2020  HGB 13.6 09/05/2020   HCT 42.0 09/05/2020   MCV 90.1 09/05/2020   PLT 160 09/05/2020    Recent Labs  Lab 09/05/20 1112 09/05/20 1325  NA 135  --   K 4.2  --   CL 106  --   CO2 19*  --   BUN 30*  --   CREATININE 1.33*  --   CALCIUM 8.6*  --   PROT  --  7.9  BILITOT  --  0.9  ALKPHOS  --  83  ALT  --  29  AST  --  63*  GLUCOSE 111*  --    No results found for: CHOL, HDL, LDLCALC, TRIG No results found for: DDIMER  Radiology/Studies:  CT HEAD WO CONTRAST  Result Date: 09/05/2020 CLINICAL DATA:  Head trauma, loss of consciousness. Facial trauma. Neck trauma. Additional history provided: Ground level fall, injury to right face/eye, TMJ pain. EXAM: CT HEAD WITHOUT CONTRAST CT MAXILLOFACIAL WITHOUT CONTRAST CT CERVICAL SPINE WITHOUT CONTRAST TECHNIQUE: Multidetector CT imaging of the head, cervical spine, and maxillofacial structures were performed using the standard protocol without intravenous contrast. Multiplanar CT image reconstructions of the cervical spine and maxillofacial structures were also generated.  COMPARISON:  MRI/MRA head 08/19/2010. FINDINGS: CT HEAD FINDINGS Brain: Mild generalized cerebral atrophy. Advanced ill-defined hypoattenuation within the cerebral white matter is nonspecific, but compatible with chronic small vessel ischemic disease. There is no acute intracranial hemorrhage. No demarcated cortical infarct. No extra-axial fluid collection. No evidence of intracranial mass. No midline shift. Vascular: No hyperdense vessel.  Atherosclerotic calcifications. Skull: Normal. Negative for fracture or focal lesion. Other: Trace fluid within the inferior right mastoid air cells. CT MAXILLOFACIAL FINDINGS Osseous: Minimally displaced acute fracture of the lateral right orbital rim at the expected location of the zygomaticofrontal suture (series 8, image 28). Acute, comminuted, mildly displaced fractures of the lateral wall of the right orbit (series 3, images 20-24). Acute comminuted and mildly displaced fractures of the posterolateral wall of the right maxillary sinus. Acute, comminuted and mildly displaced fractures of the anterior wall the right maxillary sinus. Suspect associated nondisplaced acute fractures of the inferior right orbital rim and right orbital floor, involving the right infraorbital foramen. Orbits: There are tiny foci of extraconal gas and hemorrhage within the inferior right orbit (for instance as seen on series 6, image 31). The globes are normal in size and contour. The extraocular muscles and optic nerve sheath complexes are symmetric and unremarkable. Sinuses: Near complete opacification of the right maxillary sinus with hyperdense fluid, likely reflecting blood products. Mild ethmoid and left maxillary sinus mucosal thickening. Small amount of frothy secretions within the left maxillary sinus. Soft tissues: Prominent right periorbital and pre maxillary soft tissue swelling. Small foci of gas within the soft tissues ventral and posterior to the maxillary sinus. CT CERVICAL SPINE  FINDINGS Alignment: Reversal of the expected cervical lordosis. Trace C2-C3 and C3-C4 grade 1 anterolisthesis. Trace C4-C5 grade 1 retrolisthesis. Trace T1-T2 grade 1 anterolisthesis. Skull base and vertebrae: The basion-dental and atlanto-dental intervals are maintained.No evidence of acute fracture to the cervical spine. Small T2 superior endplate Schmorl node. Soft tissues and spinal canal: No prevertebral fluid or swelling. No visible canal hematoma. Disc levels: Spondylosis with multilevel disc space narrowing, posterior disc osteophytes, uncovertebral hypertrophy and facet arthrosis. Disc space narrowing is severe at C4-C5 and C6-C7. Upper chest: Reported separately. No consolidation within the imaged lung apices. No visible pneumothorax IMPRESSION: CT head: 1. No evidence of acute intracranial abnormality. 2.  Advanced cerebral white matter chronic small vessel ischemic disease 3. Mild cerebral atrophy. 4. Trace right mastoid effusion. CT maxillofacial: 1. Acute fractures of the lateral rim, lateral wall and likely inferior rim/floor of the right orbit as described. 2. Acute fractures of the anterior and posterolateral walls of the right maxillary sinus as described. 3. Prominent right periorbital and pre maxillary soft tissue swelling. 4. Small amount of extraconal gas and hemorrhage within the inferior right orbit. 5. Blood products near completely opacify the right maxillary sinus. CT cervical spine: 1. No evidence of acute fracture to the cervical spine. 2. Nonspecific reversal of the expected cervical lordosis. Mild multilevel grade 1 spondylolisthesis as detailed. 3. Cervical spondylosis as described. Electronically Signed   By: Kellie Simmering DO   On: 09/05/2020 12:55   CT Cervical Spine Wo Contrast  Result Date: 09/05/2020 CLINICAL DATA:  Head trauma, loss of consciousness. Facial trauma. Neck trauma. Additional history provided: Ground level fall, injury to right face/eye, TMJ pain. EXAM: CT HEAD  WITHOUT CONTRAST CT MAXILLOFACIAL WITHOUT CONTRAST CT CERVICAL SPINE WITHOUT CONTRAST TECHNIQUE: Multidetector CT imaging of the head, cervical spine, and maxillofacial structures were performed using the standard protocol without intravenous contrast. Multiplanar CT image reconstructions of the cervical spine and maxillofacial structures were also generated. COMPARISON:  MRI/MRA head 08/19/2010. FINDINGS: CT HEAD FINDINGS Brain: Mild generalized cerebral atrophy. Advanced ill-defined hypoattenuation within the cerebral white matter is nonspecific, but compatible with chronic small vessel ischemic disease. There is no acute intracranial hemorrhage. No demarcated cortical infarct. No extra-axial fluid collection. No evidence of intracranial mass. No midline shift. Vascular: No hyperdense vessel.  Atherosclerotic calcifications. Skull: Normal. Negative for fracture or focal lesion. Other: Trace fluid within the inferior right mastoid air cells. CT MAXILLOFACIAL FINDINGS Osseous: Minimally displaced acute fracture of the lateral right orbital rim at the expected location of the zygomaticofrontal suture (series 8, image 28). Acute, comminuted, mildly displaced fractures of the lateral wall of the right orbit (series 3, images 20-24). Acute comminuted and mildly displaced fractures of the posterolateral wall of the right maxillary sinus. Acute, comminuted and mildly displaced fractures of the anterior wall the right maxillary sinus. Suspect associated nondisplaced acute fractures of the inferior right orbital rim and right orbital floor, involving the right infraorbital foramen. Orbits: There are tiny foci of extraconal gas and hemorrhage within the inferior right orbit (for instance as seen on series 6, image 31). The globes are normal in size and contour. The extraocular muscles and optic nerve sheath complexes are symmetric and unremarkable. Sinuses: Near complete opacification of the right maxillary sinus with  hyperdense fluid, likely reflecting blood products. Mild ethmoid and left maxillary sinus mucosal thickening. Small amount of frothy secretions within the left maxillary sinus. Soft tissues: Prominent right periorbital and pre maxillary soft tissue swelling. Small foci of gas within the soft tissues ventral and posterior to the maxillary sinus. CT CERVICAL SPINE FINDINGS Alignment: Reversal of the expected cervical lordosis. Trace C2-C3 and C3-C4 grade 1 anterolisthesis. Trace C4-C5 grade 1 retrolisthesis. Trace T1-T2 grade 1 anterolisthesis. Skull base and vertebrae: The basion-dental and atlanto-dental intervals are maintained.No evidence of acute fracture to the cervical spine. Small T2 superior endplate Schmorl node. Soft tissues and spinal canal: No prevertebral fluid or swelling. No visible canal hematoma. Disc levels: Spondylosis with multilevel disc space narrowing, posterior disc osteophytes, uncovertebral hypertrophy and facet arthrosis. Disc space narrowing is severe at C4-C5 and C6-C7. Upper chest: Reported separately. No consolidation within the imaged lung apices. No  visible pneumothorax IMPRESSION: CT head: 1. No evidence of acute intracranial abnormality. 2. Advanced cerebral white matter chronic small vessel ischemic disease 3. Mild cerebral atrophy. 4. Trace right mastoid effusion. CT maxillofacial: 1. Acute fractures of the lateral rim, lateral wall and likely inferior rim/floor of the right orbit as described. 2. Acute fractures of the anterior and posterolateral walls of the right maxillary sinus as described. 3. Prominent right periorbital and pre maxillary soft tissue swelling. 4. Small amount of extraconal gas and hemorrhage within the inferior right orbit. 5. Blood products near completely opacify the right maxillary sinus. CT cervical spine: 1. No evidence of acute fracture to the cervical spine. 2. Nonspecific reversal of the expected cervical lordosis. Mild multilevel grade 1  spondylolisthesis as detailed. 3. Cervical spondylosis as described. Electronically Signed   By: Kellie Simmering DO   On: 09/05/2020 12:55   CT CHEST ABDOMEN PELVIS W CONTRAST  Result Date: 09/05/2020 CLINICAL DATA:  86 year old male with history of trauma from a fall earlier today. Patient is on anticoagulation. EXAM: CT CHEST, ABDOMEN, AND PELVIS WITH CONTRAST TECHNIQUE: Multidetector CT imaging of the chest, abdomen and pelvis was performed following the standard protocol during bolus administration of intravenous contrast. CONTRAST:  115mL OMNIPAQUE IOHEXOL 300 MG/ML  SOLN COMPARISON:  No prior chest CT. CT the abdomen and pelvis 12/06/2017. FINDINGS: CT CHEST FINDINGS Cardiovascular: Heart size is normal. There is no significant pericardial fluid, thickening or pericardial calcification. No acute abnormality of the thoracic aorta. There is aortic atherosclerosis, as well as atherosclerosis of the great vessels of the mediastinum and the coronary arteries, including calcified atherosclerotic plaque in the left main, left anterior descending and left circumflex coronary arteries. Thickening and calcification of the aortic valve. Mediastinum/Nodes: No high attenuation fluid collection within the mediastinum to suggest significant posttraumatic hemorrhage. No pathologically enlarged mediastinal or hilar lymph nodes. Esophagus is unremarkable in appearance. No axillary lymphadenopathy. Lungs/Pleura: No pneumothorax. No acute consolidative airspace disease. No hemothorax or pleural effusions. Diffuse bronchial wall thickening with mild to moderate centrilobular and paraseptal emphysema. In addition, there are patchy areas of ground-glass attenuation, septal thickening, traction bronchiectasis and peripheral bronchiolectasis most evident throughout the mid to lower lungs bilaterally, indicative of underlying interstitial lung disease. Musculoskeletal: No acute displaced fractures or aggressive appearing lytic or  blastic lesions are noted in the visualized portions of the skeleton. CT ABDOMEN PELVIS FINDINGS Hepatobiliary: No evidence of significant acute traumatic injury to the liver. Multiple subcentimeter low-attenuation lesions scattered throughout the hepatic parenchyma, similar in size and number to the prior examination, statistically likely to represent cysts. No other larger suspicious appearing hepatic lesions. No intra or extrahepatic biliary ductal dilatation. Gallbladder is normal in appearance. Pancreas: No evidence of significant acute traumatic injury to the pancreas. No pancreatic mass. No pancreatic ductal dilatation. No pancreatic or peripancreatic fluid collections or inflammatory changes. Spleen: Unremarkable. Specifically, no evidence of acute traumatic injury to the spleen. Adrenals/Urinary Tract: No evidence of acute traumatic injury to either kidney or adrenal gland. 1.6 cm exophytic low-attenuation lesion in the lower pole of the left kidney, compatible with a simple cyst. Right kidney and bilateral adrenal glands are normal in appearance. No hydroureteronephrosis. Urinary bladder is intact and normal in appearance. Stomach/Bowel: No definitive evidence of acute traumatic injury to the hollow viscera. The appearance of the stomach is normal. There is no pathologic dilatation of small bowel or colon. Numerous colonic diverticulae are noted, without surrounding inflammatory changes to suggests an acute diverticulitis at this time.  The appendix is not confidently identified and may be surgically absent. Regardless, there are no inflammatory changes noted adjacent to the cecum to suggest the presence of an acute appendicitis at this time. Vascular/Lymphatic: Aortic atherosclerosis, without evidence of aneurysm or dissection in the abdominal or pelvic vasculature. No lymphadenopathy noted in the abdomen or pelvis. Reproductive: Prostate gland is severely enlarged with median lobe hypertrophy measuring  6.6 x 4.6 x 7.4 cm. Seminal vesicles are unremarkable in appearance. Other: No significant volume of ascites.  No pneumoperitoneum. Musculoskeletal: There are no acute displaced fractures or aggressive appearing lytic or blastic lesions noted in the visualized portions of the skeleton. IMPRESSION: 1. No definite evidence of significant acute traumatic injury to the chest, abdomen or pelvis. 2. Mild diffuse bronchial wall thickening with mild to moderate centrilobular and paraseptal emphysema; imaging findings suggestive of underlying COPD. 3. In addition, there is evidence of interstitial lung disease, with a spectrum of findings categorized as probable usual interstitial pneumonia (UIP) per current ATS guidelines. 4. Colonic diverticulosis without evidence of acute diverticulitis at this time. 5. Aortic atherosclerosis, in addition to left main, left anterior descending and left circumflex coronary artery disease. 6. There are calcifications of the aortic valve. Echocardiographic correlation for evaluation of potential valvular dysfunction may be warranted if clinically indicated. 7. Prostatomegaly with median lobe hypertrophy. 8. Additional incidental findings, as above. Electronically Signed   By: Vinnie Langton M.D.   On: 09/05/2020 12:52   CT T-SPINE NO CHARGE  Result Date: 09/05/2020 CLINICAL DATA:  86 year old male status post ground level fall. Pain. EXAM: CT THORACIC SPINE WITH CONTRAST TECHNIQUE: Multiplanar CT images of the thoracic spine were reconstructed from contemporary CT of the Chest. CONTRAST:  None or No additional COMPARISON:  CT cervical, chest, Abdomen, and Pelvis today are reported separately. FINDINGS: Limited cervical spine imaging: Cervicothoracic junction alignment is within normal limits. Thoracic spine segmentation:  Normal. Alignment: Preserved thoracic kyphosis. No spondylolisthesis *CRASH * that there is subtle degenerative appearing anterolisthesis of T1 on T2, with associated  chronic facet and endplate degeneration. No other spondylolisthesis. Vertebrae: No acute osseous abnormality identified. Visible posterior ribs also appear intact. There is ankylosis related to anterior endplate osteophytes from the T6 to the T10 level. Possible developing facet ankylosis also on the left at T1-T2. Paraspinal and other soft tissues: Chest and upper abdominal viscera reported separately. Thoracic paraspinal soft tissues are within normal limits. Disc levels: Mild grade 1 anterolisthesis of T1 on T2 with moderate to severe disc space loss, and endplate spurring on the right. Moderate facet hypertrophy. However, evidence of developing left side facet ankylosis. Mild bilateral T1 foraminal stenosis. Elsewhere mild for age thoracic spine degeneration. No CT evidence of thoracic spinal stenosis. IMPRESSION: 1. No acute traumatic injury identified in the thoracic spine. CT Chest, Abdomen, and Pelvis are reported separately. 2. Mild anterolisthesis at T1-T2 with disc and facet degeneration. Elsewhere mild for age thoracic spine degeneration. No CT evidence of thoracic spinal stenosis. Electronically Signed   By: Genevie Ann M.D.   On: 09/05/2020 12:43   CT L-SPINE NO CHARGE  Result Date: 09/05/2020 CLINICAL DATA:  86 year old male status post ground level fall. Pain. EXAM: CT LUMBAR SPINE WITH CONTRAST TECHNIQUE: Technique: Multiplanar CT images of the lumbar spine were reconstructed from contemporary CT of the Abdomen and Pelvis. CONTRAST:  No additional COMPARISON:  CT thoracic spine, chest, Abdomen, and Pelvis today are reported separately. CT Abdomen and Pelvis 12/06/2017. FINDINGS: Segmentation: Normal, concordant with the thoracic numbering  today. Alignment: Straightening of lumbar lordosis. Mild dextroconvex lumbar scoliosis. No spondylolisthesis. Vertebrae: The lumbar vertebrae appear stable since 2019 and intact. Visible sacrum and SI joints also appear intact. Paraspinal and other soft tissues:  Abdominal and pelvic viscera reported separately today. Lumbar paraspinal soft tissues remain normal. Disc levels: Chronic lumbar spine degeneration maximal at L2-L3, L4-L5, and L5-S1 does not appear significantly changed from the 2019 CT. There is moderate to severe multifactorial spinal stenosis suspected at L4-L5. IMPRESSION: 1. No acute traumatic injury identified in the lumbar spine. 2.  CT Abdomen and Pelvis today are reported separately. 3. 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 Electronically Signed   By: Genevie Ann M.D.   On: 09/05/2020 12:49   ECHOCARDIOGRAM COMPLETE  Result Date: 09/06/2020    ECHOCARDIOGRAM REPORT   Patient Name:   Michael Gilbert [facility] Date of Exam: 09/05/2020 Medical Rec #:  [id number]     Height:       72.0 in Accession #:    [id number]    Weight:       170.0 lb Date of Birth:  [birth year]      BSA:          1.988 m Patient Age:    61 years      BP:           106/90 mmHg Patient Gender: M             HR:           58 bpm. Exam Location:  [facility] Procedure: 2D Echo, Cardiac Doppler and Color Doppler Indications:     R55 Syncope  History:         Patient has no prior history of Echocardiogram examinations.                  Risk Factors:Hypertension. Bradycardia.  Sonographer:     Wilford Sports Rodgers-Jones Referring Phys:  [place] Diagnosing Phys: Serafina Royals MD IMPRESSIONS  1. Left ventricular ejection fraction, by estimation, is 55 to 60%. The left ventricle has normal function. The left ventricle has no regional wall motion abnormalities. Left ventricular diastolic parameters were normal.  2. Right ventricular systolic function is normal. The right ventricular size is normal.  3. The mitral valve is normal in structure. Mild mitral valve regurgitation.  4. The aortic valve is normal in structure. Aortic valve regurgitation is trivial. FINDINGS  Left Ventricle: Left ventricular ejection  fraction, by estimation, is 55 to 60%. The left ventricle has normal function. The left ventricle has no regional wall motion abnormalities. The left ventricular internal cavity size was normal in size. There is  no left ventricular hypertrophy. Left ventricular diastolic parameters were normal. Right Ventricle: The right ventricular size is normal. No increase in right ventricular wall thickness. Right ventricular systolic function is normal. Left Atrium: Left atrial size was normal in size. Right Atrium: Right atrial size was normal in size. Pericardium: There is no evidence of pericardial effusion. Mitral Valve: The mitral valve is normal in structure. Mild mitral valve regurgitation. Tricuspid Valve: The tricuspid valve is normal in structure. Tricuspid valve regurgitation is mild. Aortic Valve: The aortic valve is normal in structure. Aortic valve regurgitation is trivial. Pulmonic Valve: The pulmonic valve was normal in structure. Pulmonic valve regurgitation is not visualized. Aorta: The aortic root and ascending aorta are structurally normal, with no evidence of dilitation. IAS/Shunts: No atrial level shunt  detected by color flow Doppler.  LEFT VENTRICLE PLAX 2D LVIDd:         4.69 cm LVIDs:         2.78 cm LV PW:         0.81 cm LV IVS:        0.83 cm LVOT diam:     2.30 cm LV SV:         90 LV SV Index:   45 LVOT Area:     4.15 cm  RIGHT VENTRICLE RV Basal diam:  5.22 cm RV S prime:     21.05 cm/s TAPSE (M-mode): 4.0 cm LEFT ATRIUM              Index       RIGHT ATRIUM           Index LA diam:        5.40 cm  2.72 cm/m  RA Area:     27.10 cm LA Vol (A2C):   119.0 ml 59.85 ml/m RA Volume:   102.00 ml 51.30 ml/m LA Vol (A4C):   101.0 ml 50.80 ml/m LA Biplane Vol: 110.0 ml 55.32 ml/m  AORTIC VALVE LVOT Vmax:   99.95 cm/s LVOT Vmean:  72.950 cm/s LVOT VTI:    0.217 m  AORTA Ao Root diam: 3.90 cm Ao Asc diam:  4.30 cm MV E velocity: 89.97 cm/s  TRICUSPID VALVE                            TR Peak grad:    46.5 mmHg                            TR Vmax:        341.00 cm/s                             SHUNTS                            Systemic VTI:  0.22 m                            Systemic Diam: 2.30 cm Serafina Royals MD Electronically signed by Serafina Royals MD Signature Date/Time: 09/06/2020/7:16:58 AM    Final    CT Maxillofacial Wo Contrast  Result Date: 09/05/2020 CLINICAL DATA:  Head trauma, loss of consciousness. Facial trauma. Neck trauma. Additional history provided: Ground level fall, injury to right face/eye, TMJ pain. EXAM: CT HEAD WITHOUT CONTRAST CT MAXILLOFACIAL WITHOUT CONTRAST CT CERVICAL SPINE WITHOUT CONTRAST TECHNIQUE: Multidetector CT imaging of the head, cervical spine, and maxillofacial structures were performed using the standard protocol without intravenous contrast. Multiplanar CT image reconstructions of the cervical spine and maxillofacial structures were also generated. COMPARISON:  MRI/MRA head 08/19/2010. FINDINGS: CT HEAD FINDINGS Brain: Mild generalized cerebral atrophy. Advanced ill-defined hypoattenuation within the cerebral white matter is nonspecific, but compatible with chronic small vessel ischemic disease. There is no acute intracranial hemorrhage. No demarcated cortical infarct. No extra-axial fluid collection. No evidence of intracranial mass. No midline shift. Vascular: No hyperdense vessel.  Atherosclerotic calcifications. Skull: Normal. Negative for fracture or focal lesion. Other: Trace fluid within the inferior right mastoid air cells. CT MAXILLOFACIAL FINDINGS Osseous: Minimally displaced acute fracture of the lateral right  orbital rim at the expected location of the zygomaticofrontal suture (series 8, image 28). Acute, comminuted, mildly displaced fractures of the lateral wall of the right orbit (series 3, images 20-24). Acute comminuted and mildly displaced fractures of the posterolateral wall of the right maxillary sinus. Acute, comminuted and mildly displaced  fractures of the anterior wall the right maxillary sinus. Suspect associated nondisplaced acute fractures of the inferior right orbital rim and right orbital floor, involving the right infraorbital foramen. Orbits: There are tiny foci of extraconal gas and hemorrhage within the inferior right orbit (for instance as seen on series 6, image 31). The globes are normal in size and contour. The extraocular muscles and optic nerve sheath complexes are symmetric and unremarkable. Sinuses: Near complete opacification of the right maxillary sinus with hyperdense fluid, likely reflecting blood products. Mild ethmoid and left maxillary sinus mucosal thickening. Small amount of frothy secretions within the left maxillary sinus. Soft tissues: Prominent right periorbital and pre maxillary soft tissue swelling. Small foci of gas within the soft tissues ventral and posterior to the maxillary sinus. CT CERVICAL SPINE FINDINGS Alignment: Reversal of the expected cervical lordosis. Trace C2-C3 and C3-C4 grade 1 anterolisthesis. Trace C4-C5 grade 1 retrolisthesis. Trace T1-T2 grade 1 anterolisthesis. Skull base and vertebrae: The basion-dental and atlanto-dental intervals are maintained.No evidence of acute fracture to the cervical spine. Small T2 superior endplate Schmorl node. Soft tissues and spinal canal: No prevertebral fluid or swelling. No visible canal hematoma. Disc levels: Spondylosis with multilevel disc space narrowing, posterior disc osteophytes, uncovertebral hypertrophy and facet arthrosis. Disc space narrowing is severe at C4-C5 and C6-C7. Upper chest: Reported separately. No consolidation within the imaged lung apices. No visible pneumothorax IMPRESSION: CT head: 1. No evidence of acute intracranial abnormality. 2. Advanced cerebral white matter chronic small vessel ischemic disease 3. Mild cerebral atrophy. 4. Trace right mastoid effusion. CT maxillofacial: 1. Acute fractures of the lateral rim, lateral wall and  likely inferior rim/floor of the right orbit as described. 2. Acute fractures of the anterior and posterolateral walls of the right maxillary sinus as described. 3. Prominent right periorbital and pre maxillary soft tissue swelling. 4. Small amount of extraconal gas and hemorrhage within the inferior right orbit. 5. Blood products near completely opacify the right maxillary sinus. CT cervical spine: 1. No evidence of acute fracture to the cervical spine. 2. Nonspecific reversal of the expected cervical lordosis. Mild multilevel grade 1 spondylolisthesis as detailed. 3. Cervical spondylosis as described. Electronically Signed   By: Kellie Simmering DO   On: 09/05/2020 12:55    EKG: Atrial flutter with slow ventricular rate  Weights: Filed Weights   09/05/20 1110  Weight: 77.1 kg     Physical Exam: Blood pressure (!) 167/77, pulse 60, temperature 98.1 F (36.7 C), temperature source Oral, resp. rate 19, height 6' (1.829 m), weight 77.1 kg, SpO2 97 %. Body mass index is 23.06 kg/m. General: Well developed, well nourished, in no acute distress. Head eyes ears nose throat: Normocephalic, atraumatic, sclera non-icteric, no xanthomas, nares are without discharge. No apparent thyromegaly and/or mass  Lungs: Normal respiratory effort.  no wheezes, no rales, no rhonchi.  Heart: RRR with normal S1 S2.  2+ right upper sternal border murmur gallop, no rub, PMI is normal size and placement, carotid upstroke normal without bruit, jugular venous pressure is normal Abdomen: Soft, non-tender, non-distended with normoactive bowel sounds. No hepatomegaly. No rebound/guarding. No obvious abdominal masses. Abdominal aorta is normal size without bruit Extremities: Trace edema. no  cyanosis, no clubbing, no ulcers  Peripheral : 2+ bilateral upper extremity pulses, 2+ bilateral femoral pulses, 2+ bilateral dorsal pedal pulse Neuro: Alert and oriented. No facial asymmetry. No focal deficit. Moves all extremities  spontaneously. Musculoskeletal: Normal muscle tone without kyphosis Psych:  Responds to questions appropriately with a normal affect.    Assessment: 86 year old male with hypertension hyperlipidemia and chronic nonvalvular atrial flutter with significant slow rate now causing an episode of syncope consistent with sick sinus syndrome and no current evidence of myocardial infarction acute coronary syndrome congestive heart failure or angina  Plan: 1.  Continue to abstain from any medication that may cause hypotension and/or bradycardia exacerbating episode of syncope 2.  Proceed to leadless Micra pacemaker placement for symptomatic bradycardia sick sinus syndrome with syncope.  This has been ordered and planned for Thursday 3.  Holding Eliquis at this time for risk reduction and bleeding complications with procedure 4.  No further cardiac diagnostics necessary at this time with echocardiogram showing normal LV systolic function and no further evidence of concerns of acute coronary syndrome or congestive heart failure  Signed, Corey Skains M.D. [facility] 09/06/2020, 8:09 AM

## 2020-09-06 NOTE — ED Notes (Signed)
Pt woke upon RN rounding. Urinal emptied and pt denies needing anything at this time. NAD noted.

## 2020-09-07 ENCOUNTER — Ambulatory Visit: Payer: Medicare Other | Admitting: Podiatry

## 2020-09-07 ENCOUNTER — Encounter
Admission: EM | Disposition: A | Discharge status: Home / Self Care | Payer: Self-pay | Source: Home / Self Care | Attending: Family Medicine

## 2020-09-07 HISTORY — PX: PACEMAKER LEADLESS INSERTION: EP1219

## 2020-09-07 LAB — CBC WITH DIFFERENTIAL/PLATELET
Abs Immature Granulocytes: 0.03 10*3/uL (ref 0.00–0.07)
Basophils Absolute: 0.1 10*3/uL (ref 0.0–0.1)
Basophils Relative: 1 %
Eosinophils Absolute: 0.3 10*3/uL (ref 0.0–0.5)
Eosinophils Relative: 5 %
HCT: 39.4 % (ref 39.0–52.0)
Hemoglobin: 13.1 g/dL (ref 13.0–17.0)
Immature Granulocytes: 1 %
Lymphocytes Relative: 24 %
Lymphs Abs: 1.4 10*3/uL (ref 0.7–4.0)
MCH: 29.2 pg (ref 26.0–34.0)
MCHC: 33.2 g/dL (ref 30.0–36.0)
MCV: 87.8 fL (ref 80.0–100.0)
Monocytes Absolute: 0.6 10*3/uL (ref 0.1–1.0)
Monocytes Relative: 10 %
Neutro Abs: 3.6 10*3/uL (ref 1.7–7.7)
Neutrophils Relative %: 59 %
Platelets: 160 10*3/uL (ref 150–400)
RBC: 4.49 MIL/uL (ref 4.22–5.81)
RDW: 14.7 % (ref 11.5–15.5)
WBC: 6 10*3/uL (ref 4.0–10.5)
nRBC: 0 % (ref 0.0–0.2)

## 2020-09-07 LAB — COMPREHENSIVE METABOLIC PANEL
ALT: 25 U/L (ref 0–44)
AST: 53 U/L — ABNORMAL HIGH (ref 15–41)
Albumin: 3.3 g/dL — ABNORMAL LOW (ref 3.5–5.0)
Alkaline Phosphatase: 67 U/L (ref 38–126)
Anion gap: 9 (ref 5–15)
BUN: 23 mg/dL (ref 8–23)
CO2: 21 mmol/L — ABNORMAL LOW (ref 22–32)
Calcium: 8.5 mg/dL — ABNORMAL LOW (ref 8.9–10.3)
Chloride: 105 mmol/L (ref 98–111)
Creatinine, Ser: 1.32 mg/dL — ABNORMAL HIGH (ref 0.61–1.24)
GFR, Estimated: 51 mL/min — ABNORMAL LOW (ref >60)
Glucose, Bld: 93 mg/dL (ref 70–99)
Potassium: 3.8 mmol/L (ref 3.5–5.1)
Sodium: 135 mmol/L (ref 135–145)
Total Bilirubin: 1.1 mg/dL (ref 0.3–1.2)
Total Protein: 6.8 g/dL (ref 6.5–8.1)

## 2020-09-07 LAB — GLUCOSE, CAPILLARY: Glucose-Capillary: 83 mg/dL (ref 70–99)

## 2020-09-07 LAB — MAGNESIUM: Magnesium: 2.1 mg/dL (ref 1.7–2.4)

## 2020-09-07 LAB — PHOSPHORUS: Phosphorus: 3.3 mg/dL (ref 2.5–4.6)

## 2020-09-07 SURGERY — PACEMAKER LEADLESS INSERTION
Anesthesia: Moderate Sedation

## 2020-09-07 MED ORDER — IOHEXOL 300 MG/ML  SOLN
INTRAMUSCULAR | Status: DC | PRN
  Administered 2020-09-07: 10 mL

## 2020-09-07 MED ORDER — MIDAZOLAM HCL 2 MG/2ML IJ SOLN
INTRAMUSCULAR | Status: DC | PRN
  Administered 2020-09-07: 1 mg via INTRAVENOUS

## 2020-09-07 MED ORDER — HEPARIN SODIUM (PORCINE) 1000 UNIT/ML IJ SOLN
INTRAMUSCULAR | Status: AC
  Filled 2020-09-07: qty 1

## 2020-09-07 MED ORDER — SODIUM CHLORIDE 0.9 % IV SOLN: 250.0000 mL | INTRAVENOUS | Status: DC | PRN

## 2020-09-07 MED ORDER — HEPARIN (PORCINE) IN NACL 1000-0.9 UT/500ML-% IV SOLN
INTRAVENOUS | Status: DC | PRN
  Administered 2020-09-07: 1000 mL

## 2020-09-07 MED ORDER — MIDAZOLAM HCL 2 MG/2ML IJ SOLN
INTRAMUSCULAR | Status: AC
  Filled 2020-09-07: qty 2

## 2020-09-07 MED ORDER — ONDANSETRON HCL 4 MG/2ML IJ SOLN: 4.0000 mg | Freq: Four times a day (QID) | INTRAMUSCULAR | Status: DC | PRN

## 2020-09-07 MED ORDER — FENTANYL CITRATE (PF) 100 MCG/2ML IJ SOLN
INTRAMUSCULAR | Status: AC
  Filled 2020-09-07: qty 2

## 2020-09-07 MED ORDER — SODIUM CHLORIDE 0.9% FLUSH
3.0000 mL | Freq: Two times a day (BID) | INTRAVENOUS | Status: DC
  Administered 2020-09-08: 3 mL via INTRAVENOUS

## 2020-09-07 MED ORDER — CHLORHEXIDINE GLUCONATE CLOTH 2 % EX PADS
6.0000 | MEDICATED_PAD | Freq: Every day | CUTANEOUS | Status: DC
  Administered 2020-09-07: 6 via TOPICAL

## 2020-09-07 MED ORDER — ACETAMINOPHEN 325 MG PO TABS: 650.0000 mg | ORAL_TABLET | ORAL | Status: DC | PRN

## 2020-09-07 MED ORDER — SODIUM CHLORIDE 0.9% FLUSH: 3.0000 mL | INTRAVENOUS | Status: DC | PRN

## 2020-09-07 MED ORDER — HEPARIN SODIUM (PORCINE) 1000 UNIT/ML IJ SOLN
INTRAMUSCULAR | Status: DC | PRN
  Administered 2020-09-07: 4000 [IU] via INTRAVENOUS

## 2020-09-07 MED ORDER — HEPARIN (PORCINE) IN NACL 1000-0.9 UT/500ML-% IV SOLN
INTRAVENOUS | Status: AC
  Filled 2020-09-07: qty 1000

## 2020-09-07 MED ORDER — FENTANYL CITRATE (PF) 100 MCG/2ML IJ SOLN
INTRAMUSCULAR | Status: DC | PRN
  Administered 2020-09-07: 25 ug via INTRAVENOUS

## 2020-09-07 MED ORDER — LIDOCAINE HCL (PF) 1 % IJ SOLN
INTRAMUSCULAR | Status: AC
  Filled 2020-09-07: qty 30

## 2020-09-07 MED ORDER — MUPIROCIN 2 % EX OINT
1.0000 "application " | TOPICAL_OINTMENT | Freq: Two times a day (BID) | CUTANEOUS | Status: DC
  Administered 2020-09-07 – 2020-09-08 (×3): 1 via NASAL
  Filled 2020-09-07: qty 22

## 2020-09-07 SURGICAL SUPPLY — 13 items
DILATOR VESSEL 38 20CM 12FR (INTRODUCER) ×3 IMPLANT
DILATOR VESSEL 38 20CM 14FR (INTRODUCER) ×3 IMPLANT
DILATOR VESSEL 38 20CM 18FR (INTRODUCER) ×3 IMPLANT
DILATOR VESSEL 38 20CM 8FR (INTRODUCER) ×3 IMPLANT
GUIDEWIRE SUPER STIFF .035X180 (WIRE) ×3 IMPLANT
MICRA AV TRANSCATH PACING SYS (Pacemaker) ×3 IMPLANT
MICRA INTRODUCER SHEATH (SHEATH) ×3
NEEDLE PERC 18GX7CM (NEEDLE) ×3 IMPLANT
PACK CARDIAC CATH (CUSTOM PROCEDURE TRAY) ×3 IMPLANT
SHEATH AVANTI 7FRX11 (SHEATH) ×3 IMPLANT
SHEATH INTRODUCER MICRA (SHEATH) ×1 IMPLANT
SYSTEM PACING TRNSCTH AV MICRA (Pacemaker) ×1 IMPLANT
WIRE GUIDERIGHT .035X150 (WIRE) ×6 IMPLANT

## 2020-09-07 NOTE — Progress Notes (Addendum)
PROGRESS NOTE    Michael Gilbert  HKV:[id number] DOB: 06-02-1928 DOA: 09/05/2020 PCP: Derinda Late, MD   Chief Complaint  Patient presents with  . Loss of Consciousness  . Fall    Brief Narrative:  Michael Gilbert is a 86 y.o. male with medical history significant of atrial flutter on Eliquis and htn for syncope and collapse with fall on face with facial Fracture and cardiology Dr.Kowalski Consulted by edmd Dr.smith. Suspect pt had syncope related to his bph med or could be related to his bradycardia .  Assessment & Plan:   Principal Problem:   Syncope and collapse Active Problems:   HTN (hypertension), benign   GERD (gastroesophageal reflux disease)   OSA (obstructive sleep apnea)   Atrial flutter (HCC)   CKD (chronic kidney disease) stage 3, GFR 30-59 ml/min (HCC)   Slow ventricular response   Sick sinus syndrome (HCC)  Atrial Flutter with Slow Ventricular Response  Sick Sinus Syndrome  Syncope likely 2/2 Bradycardia:  EKG with flutter with rate in 30's  Negative troponin x1 Echo with EF 55-60%, no RWMA (see report) Orthostatics pending Cardiology c/s, appreciate recommendations -  --Micra pacemaker today --Hold anticoagulation for 2 days after procedure  Atrial Flutter:  Currently rate controlled (bradycardic) Holding eliquis per cardiology recommendations with procedure Follow for resumption chadvasc 3 (age, HTN) --Hold anticoagulation for 2 days after procedure  Hypertension: --resume home losartan  Acute Fractures of the Lateral Rim, Lateral Wall, and Inferior Rim/Floor of R Orbit  Acute Fractures of the Anterior and Posterolateral Walls of the R Maxillary Sinus:  Discussed with Dr. Richardson Landry with ENT by EDP (Dr. Tamala Julian) who noted there was no indication for surgery, can be managed outpatient  OSA  Discussed with Dr. Richardson Landry today, recommending holding CPAP for 7-10 days --cont to hold CPAP  Abnormal CT scan: findings suggestive of underlying COPD and  interstitial lung disease -> needs outpatient follow up   DVT prophylaxis: SCD Code Status: (full  Family Communication: family updated at bedside today Disposition:   Status is: Inpatient  Remains inpatient appropriate because:Inpatient level of care appropriate due to severity of illness   Dispo: The patient is from: Home              Anticipated d/c is to: pending PT eval after procedure              Anticipated d/c date is: 1-2 days              Patient currently is not medically stable to d/c.  Getting pacemaker today.    Consultants:   Cardiology  ENT over phone  Procedures:  Echo IMPRESSIONS    1. Left ventricular ejection fraction, by estimation, is 55 to 60%. The  left ventricle has normal function. The left ventricle has no regional  wall motion abnormalities. Left ventricular diastolic parameters were  normal.  2. Right ventricular systolic function is normal. The right ventricular  size is normal.  3. The mitral valve is normal in structure. Mild mitral valve  regurgitation.  4. The aortic valve is normal in structure. Aortic valve regurgitation is  trivial.   Antimicrobials:  Anti-infectives (From admission, onward)   Start     Dose/Rate Route Frequency Ordered Stop   09/06/20 1100  ceFAZolin (ANCEF) IVPB 2g/100 mL premix        2 g 200 mL/hr over 30 Minutes Intravenous On call 09/06/20 1055 09/07/20 1100   09/05/20 1400  ampicillin-sulbactam (UNASYN) 1.5 g  in sodium chloride 0.9 % 100 mL IVPB        1.5 g 200 mL/hr over 30 Minutes Intravenous  Once 09/05/20 1317 09/05/20 1629     Subjective: Pt reported doing well.  Less pain over his face.    Getting pacemaker today.   Objective: Vitals:   09/07/20 0509 09/07/20 0821 09/07/20 1413 09/07/20 1500  BP: (!) 156/71 (!) 153/96 (!) 147/89 (!) 163/95  Pulse: (!) 46 (!) 50 61 60  Resp: 17 18 20 18   Temp: 98.6 F (37 C) 97.6 F (36.4 C) 97.8 F (36.6 C) 98.8 F (37.1 C)  TempSrc: Oral Oral  Oral Oral  SpO2: 95% 98% 95% 95%  Weight: 78.6 kg     Height:        Intake/Output Summary (Last 24 hours) at 09/07/2020 1631 Last data filed at 09/07/2020 1306 Gross per 24 hour  Intake 0 ml  Output 1225 ml  Net -1225 ml   Filed Weights   09/05/20 1110 09/06/20 1600 09/07/20 0509  Weight: 77.1 kg 78.5 kg 78.6 kg    Examination:  Constitutional: NAD, AAOx3 HEENT: conjunctivae and lids normal, EOMI CV: No cyanosis.   RESP: normal respiratory effort, on RA Extremities: No effusions, edema in BLE SKIN: warm, dry and intact Neuro: II - XII grossly intact.   Psych: Normal mood and affect.  Appropriate judgement and reason    Data Reviewed: I have personally reviewed following labs and imaging studies  CBC: Recent Labs  Lab 09/05/20 1112 09/07/20 0451  WBC 7.2 6.0  NEUTROABS  --  3.6  HGB 13.6 13.1  HCT 42.0 39.4  MCV 90.1 87.8  PLT 160 681    Basic Metabolic Panel: Recent Labs  Lab 09/05/20 1112 09/07/20 0451  NA 135 135  K 4.2 3.8  CL 106 105  CO2 19* 21*  GLUCOSE 111* 93  BUN 30* 23  CREATININE 1.33* 1.32*  CALCIUM 8.6* 8.5*  MG  --  2.1  PHOS  --  3.3    GFR: Estimated Creatinine Clearance: 39.6 mL/min (A) (by C-G formula based on SCr of 1.32 mg/dL (H)).  Liver Function Tests: Recent Labs  Lab 09/05/20 1325 09/07/20 0451  AST 63* 53*  ALT 29 25  ALKPHOS 83 67  BILITOT 0.9 1.1  PROT 7.9 6.8  ALBUMIN 3.7 3.3*    CBG: Recent Labs  Lab 09/06/20 0618 09/07/20 0820  GLUCAP 85 83     Recent Results (from the past 240 hour(s))  Respiratory Panel by RT PCR (Flu A&B, Covid) - Nasopharyngeal Swab     Status: None   Collection Time: 09/05/20  1:25 PM   Specimen: Nasopharyngeal Swab  Result Value Ref Range Status   SARS Coronavirus 2 by RT PCR NEGATIVE NEGATIVE Final    Comment: (NOTE) SARS-CoV-2 target nucleic acids are NOT DETECTED.  The SARS-CoV-2 RNA is generally detectable in upper respiratoy specimens during the acute phase of  infection. The lowest concentration of SARS-CoV-2 viral copies this assay can detect is 131 copies/mL. A negative result does not preclude SARS-Cov-2 infection and should not be used as the sole basis for treatment or other patient management decisions. A negative result may occur with  improper specimen collection/handling, submission of specimen other than nasopharyngeal swab, presence of viral mutation(s) within the areas targeted by this assay, and inadequate number of viral copies (<131 copies/mL). A negative result must be combined with clinical observations, patient history, and epidemiological information. The expected result is  Negative.  Fact Sheet for Patients:  PinkCheek.be  Fact Sheet for Healthcare Providers:  GravelBags.it  This test is no t yet approved or cleared by the [place] FDA and  has been authorized for detection and/or diagnosis of SARS-CoV-2 by FDA under an Emergency Use Authorization (EUA). This EUA will remain  in effect (meaning this test can be used) for the duration of the COVID-19 declaration under Section 564(b)(1) of the Act, 21 U.S.C. section 360bbb-3(b)(1), unless the authorization is terminated or revoked sooner.     Influenza A by PCR NEGATIVE NEGATIVE Final   Influenza B by PCR NEGATIVE NEGATIVE Final    Comment: (NOTE) The Xpert Xpress SARS-CoV-2/FLU/RSV assay is intended as an aid in  the diagnosis of influenza from Nasopharyngeal swab specimens and  should not be used as a sole basis for treatment. Nasal washings and  aspirates are unacceptable for Xpert Xpress SARS-CoV-2/FLU/RSV  testing.  Fact Sheet for Patients: PinkCheek.be  Fact Sheet for Healthcare Providers: GravelBags.it  This test is not yet approved or cleared by the [place] FDA and  has been authorized for detection and/or diagnosis of SARS-CoV-2  by  FDA under an Emergency Use Authorization (EUA). This EUA will remain  in effect (meaning this test can be used) for the duration of the  Covid-19 declaration under Section 564(b)(1) of the Act, 21  U.S.C. section 360bbb-3(b)(1), unless the authorization is  terminated or revoked. Performed at [facility], [address], [place], [place] [address]   Surgical PCR screen     Status: Abnormal   Collection Time: 09/06/20 10:56 AM   Specimen: Nasal Mucosa; Nasal Swab  Result Value Ref Range Status   MRSA, PCR NEGATIVE NEGATIVE Final   Staphylococcus aureus POSITIVE (A) NEGATIVE Final    Comment: (NOTE) The Xpert SA Assay (FDA approved for NASAL specimens in patients 34 years of age and older), is one component of a comprehensive surveillance program. It is not intended to diagnose infection nor to guide or monitor treatment. Performed at [facility], [address], [place], [place] [address]          Radiology Studies: ECHOCARDIOGRAM COMPLETE  Result Date: 09/06/2020    ECHOCARDIOGRAM REPORT   Patient Name:   Michael Gilbert [facility] Date of Exam: 09/05/2020 Medical Rec #:  [id number]     Height:       72.0 in Accession #:    [id number]    Weight:       170.0 lb Date of Birth:  08/08/1928      BSA:          1.988 m Patient Age:    80 years      BP:           106/90 mmHg Patient Gender: M             HR:           58 bpm. Exam Location:  [facility] Procedure: 2D Echo, Cardiac Doppler and Color Doppler Indications:     R55 Syncope  History:         Patient has no prior history of Echocardiogram examinations.                  Risk Factors:Hypertension. Bradycardia.  Sonographer:     Wilford Sports Rodgers-Jones Referring Phys:  [place] Diagnosing Phys: Serafina Royals MD IMPRESSIONS  1. Left ventricular ejection fraction, by estimation, is 55 to 60%. The left ventricle has normal  function. The left ventricle has no regional wall motion abnormalities. Left ventricular diastolic  parameters were normal.  2. Right ventricular systolic function is normal. The right ventricular size is normal.  3. The mitral valve is normal in structure. Mild mitral valve regurgitation.  4. The aortic valve is normal in structure. Aortic valve regurgitation is trivial. FINDINGS  Left Ventricle: Left ventricular ejection fraction, by estimation, is 55 to 60%. The left ventricle has normal function. The left ventricle has no regional wall motion abnormalities. The left ventricular internal cavity size was normal in size. There is  no left ventricular hypertrophy. Left ventricular diastolic parameters were normal. Right Ventricle: The right ventricular size is normal. No increase in right ventricular wall thickness. Right ventricular systolic function is normal. Left Atrium: Left atrial size was normal in size. Right Atrium: Right atrial size was normal in size. Pericardium: There is no evidence of pericardial effusion. Mitral Valve: The mitral valve is normal in structure. Mild mitral valve regurgitation. Tricuspid Valve: The tricuspid valve is normal in structure. Tricuspid valve regurgitation is mild. Aortic Valve: The aortic valve is normal in structure. Aortic valve regurgitation is trivial. Pulmonic Valve: The pulmonic valve was normal in structure. Pulmonic valve regurgitation is not visualized. Aorta: The aortic root and ascending aorta are structurally normal, with no evidence of dilitation. IAS/Shunts: No atrial level shunt detected by color flow Doppler.  LEFT VENTRICLE PLAX 2D LVIDd:         4.69 cm LVIDs:         2.78 cm LV PW:         0.81 cm LV IVS:        0.83 cm LVOT diam:     2.30 cm LV SV:         90 LV SV Index:   45 LVOT Area:     4.15 cm  RIGHT VENTRICLE RV Basal diam:  5.22 cm RV S prime:     21.05 cm/s TAPSE (M-mode): 4.0 cm LEFT ATRIUM              Index       RIGHT ATRIUM           Index LA diam:        5.40 cm  2.72 cm/m  RA Area:     27.10 cm LA Vol (A2C):   119.0 ml 59.85 ml/m RA  Volume:   102.00 ml 51.30 ml/m LA Vol (A4C):   101.0 ml 50.80 ml/m LA Biplane Vol: 110.0 ml 55.32 ml/m  AORTIC VALVE LVOT Vmax:   99.95 cm/s LVOT Vmean:  72.950 cm/s LVOT VTI:    0.217 m  AORTA Ao Root diam: 3.90 cm Ao Asc diam:  4.30 cm MV E velocity: 89.97 cm/s  TRICUSPID VALVE                            TR Peak grad:   46.5 mmHg                            TR Vmax:        341.00 cm/s                             SHUNTS  Systemic VTI:  0.22 m                            Systemic Diam: 2.30 cm Serafina Royals MD Electronically signed by Serafina Royals MD Signature Date/Time: 09/06/2020/7:16:58 AM    Final         Scheduled Meds: . [MAR Hold] Chlorhexidine Gluconate Cloth  6 each Topical Q0600  . [MAR Hold] mupirocin ointment  1 application Nasal BID  . [MAR Hold] pantoprazole (PROTONIX) IV  40 mg Intravenous Q24H  . [MAR Hold] sodium chloride flush  3 mL Intravenous Q12H  . [MAR Hold] tamsulosin  0.8 mg Oral Daily  . [MAR Hold] Tdap  0.5 mL Intramuscular Once   Continuous Infusions: . sodium chloride    . sodium chloride 20 mL/hr at 09/07/20 1449     LOS: 2 days     Enzo Bi, MD [facility]   To contact the attending provider between 7A-7P or the covering provider during after hours 7P-7A, please log into the web site [email or web address] and access using universal [place] password for that web site. If you do not have the password, please call the hospital operator.  09/07/2020, 4:31 PM

## 2020-09-07 NOTE — Progress Notes (Signed)
[facility] Encounter Note  Patient: Michael Gilbert / Admit Date: 09/05/2020 / Date of Encounter: 09/07/2020, 9:08 AM   Subjective: Patient overall feels well today.  No evidence of shortness of breath chest pain weakness fatigue.  Heart rate still significantly low into the 30 bpm range most consistent with sick sinus syndrome of his atrial fibrillation.  The patient therefore will continue to need pacemaker placement.  We have discussed at length the [place] pacemaker implant again and discussed all of the risks and benefits.  The patient continues to be at low risk for conscious sedation  Review of Systems: Positive for: None Negative for: Vision change, hearing change, syncope, dizziness, nausea, vomiting,diarrhea, bloody stool, stomach pain, cough, congestion, diaphoresis, urinary frequency, urinary pain,skin lesions, skin rashes Others previously listed  Objective: Telemetry: Atrial flutter with slow ventricular rate Physical Exam: Blood pressure (!) 153/96, pulse (!) 50, temperature 97.6 F (36.4 C), temperature source Oral, resp. rate 18, height 5\' 11"  (1.803 m), weight 78.6 kg, SpO2 98 %. Body mass index is 24.17 kg/m. General: Well developed, well nourished, in no acute distress. Head: Normocephalic, atraumatic, sclera non-icteric, no xanthomas, nares are without discharge. Neck: No apparent masses Lungs: Normal respirations with no wheezes, no rhonchi, no rales , no crackles   Heart: Regular rate and rhythm, normal S1 S2, no murmur, no rub, no gallop, PMI is normal size and placement, carotid upstroke normal without bruit, jugular venous pressure normal Abdomen: Soft, non-tender, non-distended with normoactive bowel sounds. No hepatosplenomegaly. Abdominal aorta is normal size without bruit Extremities: No edema, no clubbing, no cyanosis, no ulcers,  Peripheral: 2+ radial, 2+ femoral, 2+ dorsal pedal pulses Neuro: Alert and oriented. Moves all  extremities spontaneously. Psych:  Responds to questions appropriately with a normal affect.   Intake/Output Summary (Last 24 hours) at 09/07/2020 0908 Last data filed at 09/07/2020 0648 Gross per 24 hour  Intake 0 ml  Output 550 ml  Net -550 ml    Inpatient Medications:  . Chlorhexidine Gluconate Cloth  6 each Topical Q0600  . mupirocin ointment  1 application Nasal BID  . pantoprazole (PROTONIX) IV  40 mg Intravenous Q24H  . sodium chloride flush  3 mL Intravenous Q12H  . tamsulosin  0.8 mg Oral Daily  . Tdap  0.5 mL Intramuscular Once   Infusions:  . sodium chloride    . sodium chloride    .  ceFAZolin (ANCEF) IV      Labs: Recent Labs    09/05/20 1112 09/07/20 0451  NA 135 135  K 4.2 3.8  CL 106 105  CO2 19* 21*  GLUCOSE 111* 93  BUN 30* 23  CREATININE 1.33* 1.32*  CALCIUM 8.6* 8.5*  MG  --  2.1  PHOS  --  3.3   Recent Labs    09/05/20 1325 09/07/20 0451  AST 63* 53*  ALT 29 25  ALKPHOS 83 67  BILITOT 0.9 1.1  PROT 7.9 6.8  ALBUMIN 3.7 3.3*   Recent Labs    09/05/20 1112 09/07/20 0451  WBC 7.2 6.0  NEUTROABS  --  3.6  HGB 13.6 13.1  HCT 42.0 39.4  MCV 90.1 87.8  PLT 160 160   No results for input(s): CKTOTAL, CKMB, TROPONINI in the last 72 hours. Invalid input(s): POCBNP No results for input(s): HGBA1C in the last 72 hours.   Weights: Filed Weights   09/05/20 1110 09/06/20 1600 09/07/20 0509  Weight: 77.1 kg 78.5 kg 78.6 kg  Radiology/Studies:  CT HEAD WO CONTRAST  Result Date: 09/05/2020 CLINICAL DATA:  Head trauma, loss of consciousness. Facial trauma. Neck trauma. Additional history provided: Ground level fall, injury to right face/eye, TMJ pain. EXAM: CT HEAD WITHOUT CONTRAST CT MAXILLOFACIAL WITHOUT CONTRAST CT CERVICAL SPINE WITHOUT CONTRAST TECHNIQUE: Multidetector CT imaging of the head, cervical spine, and maxillofacial structures were performed using the standard protocol without intravenous contrast. Multiplanar CT image  reconstructions of the cervical spine and maxillofacial structures were also generated. COMPARISON:  MRI/MRA head 08/19/2010. FINDINGS: CT HEAD FINDINGS Brain: Mild generalized cerebral atrophy. Advanced ill-defined hypoattenuation within the cerebral white matter is nonspecific, but compatible with chronic small vessel ischemic disease. There is no acute intracranial hemorrhage. No demarcated cortical infarct. No extra-axial fluid collection. No evidence of intracranial mass. No midline shift. Vascular: No hyperdense vessel.  Atherosclerotic calcifications. Skull: Normal. Negative for fracture or focal lesion. Other: Trace fluid within the inferior right mastoid air cells. CT MAXILLOFACIAL FINDINGS Osseous: Minimally displaced acute fracture of the lateral right orbital rim at the expected location of the zygomaticofrontal suture (series 8, image 28). Acute, comminuted, mildly displaced fractures of the lateral wall of the right orbit (series 3, images 20-24). Acute comminuted and mildly displaced fractures of the posterolateral wall of the right maxillary sinus. Acute, comminuted and mildly displaced fractures of the anterior wall the right maxillary sinus. Suspect associated nondisplaced acute fractures of the inferior right orbital rim and right orbital floor, involving the right infraorbital foramen. Orbits: There are tiny foci of extraconal gas and hemorrhage within the inferior right orbit (for instance as seen on series 6, image 31). The globes are normal in size and contour. The extraocular muscles and optic nerve sheath complexes are symmetric and unremarkable. Sinuses: Near complete opacification of the right maxillary sinus with hyperdense fluid, likely reflecting blood products. Mild ethmoid and left maxillary sinus mucosal thickening. Small amount of frothy secretions within the left maxillary sinus. Soft tissues: Prominent right periorbital and pre maxillary soft tissue swelling. Small foci of gas  within the soft tissues ventral and posterior to the maxillary sinus. CT CERVICAL SPINE FINDINGS Alignment: Reversal of the expected cervical lordosis. Trace C2-C3 and C3-C4 grade 1 anterolisthesis. Trace C4-C5 grade 1 retrolisthesis. Trace T1-T2 grade 1 anterolisthesis. Skull base and vertebrae: The basion-dental and atlanto-dental intervals are maintained.No evidence of acute fracture to the cervical spine. Small T2 superior endplate Schmorl node. Soft tissues and spinal canal: No prevertebral fluid or swelling. No visible canal hematoma. Disc levels: Spondylosis with multilevel disc space narrowing, posterior disc osteophytes, uncovertebral hypertrophy and facet arthrosis. Disc space narrowing is severe at C4-C5 and C6-C7. Upper chest: Reported separately. No consolidation within the imaged lung apices. No visible pneumothorax IMPRESSION: CT head: 1. No evidence of acute intracranial abnormality. 2. Advanced cerebral white matter chronic small vessel ischemic disease 3. Mild cerebral atrophy. 4. Trace right mastoid effusion. CT maxillofacial: 1. Acute fractures of the lateral rim, lateral wall and likely inferior rim/floor of the right orbit as described. 2. Acute fractures of the anterior and posterolateral walls of the right maxillary sinus as described. 3. Prominent right periorbital and pre maxillary soft tissue swelling. 4. Small amount of extraconal gas and hemorrhage within the inferior right orbit. 5. Blood products near completely opacify the right maxillary sinus. CT cervical spine: 1. No evidence of acute fracture to the cervical spine. 2. Nonspecific reversal of the expected cervical lordosis. Mild multilevel grade 1 spondylolisthesis as detailed. 3. Cervical spondylosis as described. Electronically Signed  By: Kellie Simmering DO   On: 09/05/2020 12:55   CT Cervical Spine Wo Contrast  Result Date: 09/05/2020 CLINICAL DATA:  Head trauma, loss of consciousness. Facial trauma. Neck trauma. Additional  history provided: Ground level fall, injury to right face/eye, TMJ pain. EXAM: CT HEAD WITHOUT CONTRAST CT MAXILLOFACIAL WITHOUT CONTRAST CT CERVICAL SPINE WITHOUT CONTRAST TECHNIQUE: Multidetector CT imaging of the head, cervical spine, and maxillofacial structures were performed using the standard protocol without intravenous contrast. Multiplanar CT image reconstructions of the cervical spine and maxillofacial structures were also generated. COMPARISON:  MRI/MRA head 08/19/2010. FINDINGS: CT HEAD FINDINGS Brain: Mild generalized cerebral atrophy. Advanced ill-defined hypoattenuation within the cerebral white matter is nonspecific, but compatible with chronic small vessel ischemic disease. There is no acute intracranial hemorrhage. No demarcated cortical infarct. No extra-axial fluid collection. No evidence of intracranial mass. No midline shift. Vascular: No hyperdense vessel.  Atherosclerotic calcifications. Skull: Normal. Negative for fracture or focal lesion. Other: Trace fluid within the inferior right mastoid air cells. CT MAXILLOFACIAL FINDINGS Osseous: Minimally displaced acute fracture of the lateral right orbital rim at the expected location of the zygomaticofrontal suture (series 8, image 28). Acute, comminuted, mildly displaced fractures of the lateral wall of the right orbit (series 3, images 20-24). Acute comminuted and mildly displaced fractures of the posterolateral wall of the right maxillary sinus. Acute, comminuted and mildly displaced fractures of the anterior wall the right maxillary sinus. Suspect associated nondisplaced acute fractures of the inferior right orbital rim and right orbital floor, involving the right infraorbital foramen. Orbits: There are tiny foci of extraconal gas and hemorrhage within the inferior right orbit (for instance as seen on series 6, image 31). The globes are normal in size and contour. The extraocular muscles and optic nerve sheath complexes are symmetric and  unremarkable. Sinuses: Near complete opacification of the right maxillary sinus with hyperdense fluid, likely reflecting blood products. Mild ethmoid and left maxillary sinus mucosal thickening. Small amount of frothy secretions within the left maxillary sinus. Soft tissues: Prominent right periorbital and pre maxillary soft tissue swelling. Small foci of gas within the soft tissues ventral and posterior to the maxillary sinus. CT CERVICAL SPINE FINDINGS Alignment: Reversal of the expected cervical lordosis. Trace C2-C3 and C3-C4 grade 1 anterolisthesis. Trace C4-C5 grade 1 retrolisthesis. Trace T1-T2 grade 1 anterolisthesis. Skull base and vertebrae: The basion-dental and atlanto-dental intervals are maintained.No evidence of acute fracture to the cervical spine. Small T2 superior endplate Schmorl node. Soft tissues and spinal canal: No prevertebral fluid or swelling. No visible canal hematoma. Disc levels: Spondylosis with multilevel disc space narrowing, posterior disc osteophytes, uncovertebral hypertrophy and facet arthrosis. Disc space narrowing is severe at C4-C5 and C6-C7. Upper chest: Reported separately. No consolidation within the imaged lung apices. No visible pneumothorax IMPRESSION: CT head: 1. No evidence of acute intracranial abnormality. 2. Advanced cerebral white matter chronic small vessel ischemic disease 3. Mild cerebral atrophy. 4. Trace right mastoid effusion. CT maxillofacial: 1. Acute fractures of the lateral rim, lateral wall and likely inferior rim/floor of the right orbit as described. 2. Acute fractures of the anterior and posterolateral walls of the right maxillary sinus as described. 3. Prominent right periorbital and pre maxillary soft tissue swelling. 4. Small amount of extraconal gas and hemorrhage within the inferior right orbit. 5. Blood products near completely opacify the right maxillary sinus. CT cervical spine: 1. No evidence of acute fracture to the cervical spine. 2.  Nonspecific reversal of the expected cervical lordosis. Mild multilevel  grade 1 spondylolisthesis as detailed. 3. Cervical spondylosis as described. Electronically Signed   By: Kellie Simmering DO   On: 09/05/2020 12:55   CT CHEST ABDOMEN PELVIS W CONTRAST  Result Date: 09/05/2020 CLINICAL DATA:  86 year old male with history of trauma from a fall earlier today. Patient is on anticoagulation. EXAM: CT CHEST, ABDOMEN, AND PELVIS WITH CONTRAST TECHNIQUE: Multidetector CT imaging of the chest, abdomen and pelvis was performed following the standard protocol during bolus administration of intravenous contrast. CONTRAST:  136mL OMNIPAQUE IOHEXOL 300 MG/ML  SOLN COMPARISON:  No prior chest CT. CT the abdomen and pelvis 12/06/2017. FINDINGS: CT CHEST FINDINGS Cardiovascular: Heart size is normal. There is no significant pericardial fluid, thickening or pericardial calcification. No acute abnormality of the thoracic aorta. There is aortic atherosclerosis, as well as atherosclerosis of the great vessels of the mediastinum and the coronary arteries, including calcified atherosclerotic plaque in the left main, left anterior descending and left circumflex coronary arteries. Thickening and calcification of the aortic valve. Mediastinum/Nodes: No high attenuation fluid collection within the mediastinum to suggest significant posttraumatic hemorrhage. No pathologically enlarged mediastinal or hilar lymph nodes. Esophagus is unremarkable in appearance. No axillary lymphadenopathy. Lungs/Pleura: No pneumothorax. No acute consolidative airspace disease. No hemothorax or pleural effusions. Diffuse bronchial wall thickening with mild to moderate centrilobular and paraseptal emphysema. In addition, there are patchy areas of ground-glass attenuation, septal thickening, traction bronchiectasis and peripheral bronchiolectasis most evident throughout the mid to lower lungs bilaterally, indicative of underlying interstitial lung disease.  Musculoskeletal: No acute displaced fractures or aggressive appearing lytic or blastic lesions are noted in the visualized portions of the skeleton. CT ABDOMEN PELVIS FINDINGS Hepatobiliary: No evidence of significant acute traumatic injury to the liver. Multiple subcentimeter low-attenuation lesions scattered throughout the hepatic parenchyma, similar in size and number to the prior examination, statistically likely to represent cysts. No other larger suspicious appearing hepatic lesions. No intra or extrahepatic biliary ductal dilatation. Gallbladder is normal in appearance. Pancreas: No evidence of significant acute traumatic injury to the pancreas. No pancreatic mass. No pancreatic ductal dilatation. No pancreatic or peripancreatic fluid collections or inflammatory changes. Spleen: Unremarkable. Specifically, no evidence of acute traumatic injury to the spleen. Adrenals/Urinary Tract: No evidence of acute traumatic injury to either kidney or adrenal gland. 1.6 cm exophytic low-attenuation lesion in the lower pole of the left kidney, compatible with a simple cyst. Right kidney and bilateral adrenal glands are normal in appearance. No hydroureteronephrosis. Urinary bladder is intact and normal in appearance. Stomach/Bowel: No definitive evidence of acute traumatic injury to the hollow viscera. The appearance of the stomach is normal. There is no pathologic dilatation of small bowel or colon. Numerous colonic diverticulae are noted, without surrounding inflammatory changes to suggests an acute diverticulitis at this time. The appendix is not confidently identified and may be surgically absent. Regardless, there are no inflammatory changes noted adjacent to the cecum to suggest the presence of an acute appendicitis at this time. Vascular/Lymphatic: Aortic atherosclerosis, without evidence of aneurysm or dissection in the abdominal or pelvic vasculature. No lymphadenopathy noted in the abdomen or pelvis.  Reproductive: Prostate gland is severely enlarged with median lobe hypertrophy measuring 6.6 x 4.6 x 7.4 cm. Seminal vesicles are unremarkable in appearance. Other: No significant volume of ascites.  No pneumoperitoneum. Musculoskeletal: There are no acute displaced fractures or aggressive appearing lytic or blastic lesions noted in the visualized portions of the skeleton. IMPRESSION: 1. No definite evidence of significant acute traumatic injury to the chest, abdomen  or pelvis. 2. Mild diffuse bronchial wall thickening with mild to moderate centrilobular and paraseptal emphysema; imaging findings suggestive of underlying COPD. 3. In addition, there is evidence of interstitial lung disease, with a spectrum of findings categorized as probable usual interstitial pneumonia (UIP) per current ATS guidelines. 4. Colonic diverticulosis without evidence of acute diverticulitis at this time. 5. Aortic atherosclerosis, in addition to left main, left anterior descending and left circumflex coronary artery disease. 6. There are calcifications of the aortic valve. Echocardiographic correlation for evaluation of potential valvular dysfunction may be warranted if clinically indicated. 7. Prostatomegaly with median lobe hypertrophy. 8. Additional incidental findings, as above. Electronically Signed   By: Vinnie Langton M.D.   On: 09/05/2020 12:52   CT T-SPINE NO CHARGE  Result Date: 09/05/2020 CLINICAL DATA:  86 year old male status post ground level fall. Pain. EXAM: CT THORACIC SPINE WITH CONTRAST TECHNIQUE: Multiplanar CT images of the thoracic spine were reconstructed from contemporary CT of the Chest. CONTRAST:  None or No additional COMPARISON:  CT cervical, chest, Abdomen, and Pelvis today are reported separately. FINDINGS: Limited cervical spine imaging: Cervicothoracic junction alignment is within normal limits. Thoracic spine segmentation:  Normal. Alignment: Preserved thoracic kyphosis. No spondylolisthesis *CRASH *  that there is subtle degenerative appearing anterolisthesis of T1 on T2, with associated chronic facet and endplate degeneration. No other spondylolisthesis. Vertebrae: No acute osseous abnormality identified. Visible posterior ribs also appear intact. There is ankylosis related to anterior endplate osteophytes from the T6 to the T10 level. Possible developing facet ankylosis also on the left at T1-T2. Paraspinal and other soft tissues: Chest and upper abdominal viscera reported separately. Thoracic paraspinal soft tissues are within normal limits. Disc levels: Mild grade 1 anterolisthesis of T1 on T2 with moderate to severe disc space loss, and endplate spurring on the right. Moderate facet hypertrophy. However, evidence of developing left side facet ankylosis. Mild bilateral T1 foraminal stenosis. Elsewhere mild for age thoracic spine degeneration. No CT evidence of thoracic spinal stenosis. IMPRESSION: 1. No acute traumatic injury identified in the thoracic spine. CT Chest, Abdomen, and Pelvis are reported separately. 2. Mild anterolisthesis at T1-T2 with disc and facet degeneration. Elsewhere mild for age thoracic spine degeneration. No CT evidence of thoracic spinal stenosis. Electronically Signed   By: Genevie Ann M.D.   On: 09/05/2020 12:43   CT L-SPINE NO CHARGE  Result Date: 09/05/2020 CLINICAL DATA:  86 year old male status post ground level fall. Pain. EXAM: CT LUMBAR SPINE WITH CONTRAST TECHNIQUE: Technique: Multiplanar CT images of the lumbar spine were reconstructed from contemporary CT of the Abdomen and Pelvis. CONTRAST:  No additional COMPARISON:  CT thoracic spine, chest, Abdomen, and Pelvis today are reported separately. CT Abdomen and Pelvis 12/06/2017. FINDINGS: Segmentation: Normal, concordant with the thoracic numbering today. Alignment: Straightening of lumbar lordosis. Mild dextroconvex lumbar scoliosis. No spondylolisthesis. Vertebrae: The lumbar vertebrae appear stable since 2019 and  intact. Visible sacrum and SI joints also appear intact. Paraspinal and other soft tissues: Abdominal and pelvic viscera reported separately today. Lumbar paraspinal soft tissues remain normal. Disc levels: Chronic lumbar spine degeneration maximal at L2-L3, L4-L5, and L5-S1 does not appear significantly changed from the 2019 CT. There is moderate to severe multifactorial spinal stenosis suspected at L4-L5. IMPRESSION: 1. No acute traumatic injury identified in the lumbar spine. 2.  CT Abdomen and Pelvis today are reported separately. 3. 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 Electronically Signed  By: Genevie Ann M.D.   On: 09/05/2020 12:49   ECHOCARDIOGRAM COMPLETE  Result Date: 09/06/2020    ECHOCARDIOGRAM REPORT   Patient Name:   QUINDARIUS CABELLO [facility] Date of Exam: 09/05/2020 Medical Rec #:  [id number]     Height:       72.0 in Accession #:    [id number]    Weight:       170.0 lb Date of Birth:  [birth year]      BSA:          1.988 m Patient Age:    77 years      BP:           106/90 mmHg Patient Gender: M             HR:           58 bpm. Exam Location:  [facility] Procedure: 2D Echo, Cardiac Doppler and Color Doppler Indications:     R55 Syncope  History:         Patient has no prior history of Echocardiogram examinations.                  Risk Factors:Hypertension. Bradycardia.  Sonographer:     Wilford Sports Rodgers-Jones Referring Phys:  [place] Diagnosing Phys: Serafina Royals MD IMPRESSIONS  1. Left ventricular ejection fraction, by estimation, is 55 to 60%. The left ventricle has normal function. The left ventricle has no regional wall motion abnormalities. Left ventricular diastolic parameters were normal.  2. Right ventricular systolic function is normal. The right ventricular size is normal.  3. The mitral valve is normal in structure. Mild mitral valve regurgitation.  4. The aortic valve is normal in  structure. Aortic valve regurgitation is trivial. FINDINGS  Left Ventricle: Left ventricular ejection fraction, by estimation, is 55 to 60%. The left ventricle has normal function. The left ventricle has no regional wall motion abnormalities. The left ventricular internal cavity size was normal in size. There is  no left ventricular hypertrophy. Left ventricular diastolic parameters were normal. Right Ventricle: The right ventricular size is normal. No increase in right ventricular wall thickness. Right ventricular systolic function is normal. Left Atrium: Left atrial size was normal in size. Right Atrium: Right atrial size was normal in size. Pericardium: There is no evidence of pericardial effusion. Mitral Valve: The mitral valve is normal in structure. Mild mitral valve regurgitation. Tricuspid Valve: The tricuspid valve is normal in structure. Tricuspid valve regurgitation is mild. Aortic Valve: The aortic valve is normal in structure. Aortic valve regurgitation is trivial. Pulmonic Valve: The pulmonic valve was normal in structure. Pulmonic valve regurgitation is not visualized. Aorta: The aortic root and ascending aorta are structurally normal, with no evidence of dilitation. IAS/Shunts: No atrial level shunt detected by color flow Doppler.  LEFT VENTRICLE PLAX 2D LVIDd:         4.69 cm LVIDs:         2.78 cm LV PW:         0.81 cm LV IVS:        0.83 cm LVOT diam:     2.30 cm LV SV:         90 LV SV Index:   45 LVOT Area:     4.15 cm  RIGHT VENTRICLE RV Basal diam:  5.22 cm RV S prime:     21.05 cm/s TAPSE (M-mode): 4.0 cm LEFT ATRIUM  Index       RIGHT ATRIUM           Index LA diam:        5.40 cm  2.72 cm/m  RA Area:     27.10 cm LA Vol (A2C):   119.0 ml 59.85 ml/m RA Volume:   102.00 ml 51.30 ml/m LA Vol (A4C):   101.0 ml 50.80 ml/m LA Biplane Vol: 110.0 ml 55.32 ml/m  AORTIC VALVE LVOT Vmax:   99.95 cm/s LVOT Vmean:  72.950 cm/s LVOT VTI:    0.217 m  AORTA Ao Root diam: 3.90 cm Ao Asc  diam:  4.30 cm MV E velocity: 89.97 cm/s  TRICUSPID VALVE                            TR Peak grad:   46.5 mmHg                            TR Vmax:        341.00 cm/s                             SHUNTS                            Systemic VTI:  0.22 m                            Systemic Diam: 2.30 cm Serafina Royals MD Electronically signed by Serafina Royals MD Signature Date/Time: 09/06/2020/7:16:58 AM    Final    CT Maxillofacial Wo Contrast  Result Date: 09/05/2020 CLINICAL DATA:  Head trauma, loss of consciousness. Facial trauma. Neck trauma. Additional history provided: Ground level fall, injury to right face/eye, TMJ pain. EXAM: CT HEAD WITHOUT CONTRAST CT MAXILLOFACIAL WITHOUT CONTRAST CT CERVICAL SPINE WITHOUT CONTRAST TECHNIQUE: Multidetector CT imaging of the head, cervical spine, and maxillofacial structures were performed using the standard protocol without intravenous contrast. Multiplanar CT image reconstructions of the cervical spine and maxillofacial structures were also generated. COMPARISON:  MRI/MRA head 08/19/2010. FINDINGS: CT HEAD FINDINGS Brain: Mild generalized cerebral atrophy. Advanced ill-defined hypoattenuation within the cerebral white matter is nonspecific, but compatible with chronic small vessel ischemic disease. There is no acute intracranial hemorrhage. No demarcated cortical infarct. No extra-axial fluid collection. No evidence of intracranial mass. No midline shift. Vascular: No hyperdense vessel.  Atherosclerotic calcifications. Skull: Normal. Negative for fracture or focal lesion. Other: Trace fluid within the inferior right mastoid air cells. CT MAXILLOFACIAL FINDINGS Osseous: Minimally displaced acute fracture of the lateral right orbital rim at the expected location of the zygomaticofrontal suture (series 8, image 28). Acute, comminuted, mildly displaced fractures of the lateral wall of the right orbit (series 3, images 20-24). Acute comminuted and mildly displaced fractures  of the posterolateral wall of the right maxillary sinus. Acute, comminuted and mildly displaced fractures of the anterior wall the right maxillary sinus. Suspect associated nondisplaced acute fractures of the inferior right orbital rim and right orbital floor, involving the right infraorbital foramen. Orbits: There are tiny foci of extraconal gas and hemorrhage within the inferior right orbit (for instance as seen on series 6, image 31). The globes are normal in size and contour. The extraocular muscles and optic nerve sheath complexes are symmetric and unremarkable.  Sinuses: Near complete opacification of the right maxillary sinus with hyperdense fluid, likely reflecting blood products. Mild ethmoid and left maxillary sinus mucosal thickening. Small amount of frothy secretions within the left maxillary sinus. Soft tissues: Prominent right periorbital and pre maxillary soft tissue swelling. Small foci of gas within the soft tissues ventral and posterior to the maxillary sinus. CT CERVICAL SPINE FINDINGS Alignment: Reversal of the expected cervical lordosis. Trace C2-C3 and C3-C4 grade 1 anterolisthesis. Trace C4-C5 grade 1 retrolisthesis. Trace T1-T2 grade 1 anterolisthesis. Skull base and vertebrae: The basion-dental and atlanto-dental intervals are maintained.No evidence of acute fracture to the cervical spine. Small T2 superior endplate Schmorl node. Soft tissues and spinal canal: No prevertebral fluid or swelling. No visible canal hematoma. Disc levels: Spondylosis with multilevel disc space narrowing, posterior disc osteophytes, uncovertebral hypertrophy and facet arthrosis. Disc space narrowing is severe at C4-C5 and C6-C7. Upper chest: Reported separately. No consolidation within the imaged lung apices. No visible pneumothorax IMPRESSION: CT head: 1. No evidence of acute intracranial abnormality. 2. Advanced cerebral white matter chronic small vessel ischemic disease 3. Mild cerebral atrophy. 4. Trace right  mastoid effusion. CT maxillofacial: 1. Acute fractures of the lateral rim, lateral wall and likely inferior rim/floor of the right orbit as described. 2. Acute fractures of the anterior and posterolateral walls of the right maxillary sinus as described. 3. Prominent right periorbital and pre maxillary soft tissue swelling. 4. Small amount of extraconal gas and hemorrhage within the inferior right orbit. 5. Blood products near completely opacify the right maxillary sinus. CT cervical spine: 1. No evidence of acute fracture to the cervical spine. 2. Nonspecific reversal of the expected cervical lordosis. Mild multilevel grade 1 spondylolisthesis as detailed. 3. Cervical spondylosis as described. Electronically Signed   By: Kellie Simmering DO   On: 09/05/2020 12:55     Assessment and Recommendation  86 y.o. male with known chronic obstructive pulmonary disease chronic kidney disease stage III with sick sinus syndrome and syncope likely secondary to symptomatic bradycardia of atrial flutter with slow ventricular rate 1.  Proceed to Micra pacemaker placement today for symptomatic bradycardia and syncope for which patient understands all risks and benefits and is at low risk for conscious sedation and agrees.  This will occur today 2.  Abstain from anticoagulation for atrial flutter until 1 to 2 days after implant for reduction of risk of bleeding complications 3.  No further cardiac diagnostics necessary at this time  Signed, Serafina Royals M.D. FACC

## 2020-09-07 NOTE — Progress Notes (Signed)
Report called to [employer].  Patient without c/o's, will send sandwich with patient.  Patient verbalizes he needs to stay flat for 4hrs, RN awake. Wife left for evening.

## 2020-09-07 NOTE — Progress Notes (Signed)
Dr. Saralyn Pilar spoke to patient and patient's wife regarding procedure, pt verbalizes understanding of procedure.  No c/o's

## 2020-09-07 NOTE — Plan of Care (Signed)

## 2020-09-07 NOTE — Plan of Care (Signed)

## 2020-09-08 ENCOUNTER — Encounter: Payer: Self-pay | Admitting: Cardiology

## 2020-09-08 LAB — BASIC METABOLIC PANEL
Anion gap: 10 (ref 5–15)
BUN: 26 mg/dL — ABNORMAL HIGH (ref 8–23)
CO2: 19 mmol/L — ABNORMAL LOW (ref 22–32)
Calcium: 9 mg/dL (ref 8.9–10.3)
Chloride: 104 mmol/L (ref 98–111)
Creatinine, Ser: 1.39 mg/dL — ABNORMAL HIGH (ref 0.61–1.24)
GFR, Estimated: 48 mL/min — ABNORMAL LOW (ref >60)
Glucose, Bld: 88 mg/dL (ref 70–99)
Potassium: 3.8 mmol/L (ref 3.5–5.1)
Sodium: 133 mmol/L — ABNORMAL LOW (ref 135–145)

## 2020-09-08 LAB — CBC
HCT: 41.9 % (ref 39.0–52.0)
Hemoglobin: 14.1 g/dL (ref 13.0–17.0)
MCH: 29 pg (ref 26.0–34.0)
MCHC: 33.7 g/dL (ref 30.0–36.0)
MCV: 86 fL (ref 80.0–100.0)
Platelets: 156 10*3/uL (ref 150–400)
RBC: 4.87 MIL/uL (ref 4.22–5.81)
RDW: 14.8 % (ref 11.5–15.5)
WBC: 5.9 10*3/uL (ref 4.0–10.5)
nRBC: 0 % (ref 0.0–0.2)

## 2020-09-08 LAB — MAGNESIUM: Magnesium: 2 mg/dL (ref 1.7–2.4)

## 2020-09-08 MED ORDER — PANTOPRAZOLE SODIUM 40 MG PO TBEC: 40.0000 mg | DELAYED_RELEASE_TABLET | Freq: Every day | ORAL | 0 refills | Status: DC

## 2020-09-08 MED ORDER — ELIQUIS 2.5 MG PO TABS: 2.5000 mg | ORAL_TABLET | Freq: Two times a day (BID) | ORAL | Status: DC

## 2020-09-08 MED ORDER — LOSARTAN POTASSIUM 25 MG PO TABS
25.0000 mg | ORAL_TABLET | Freq: Every day | ORAL | Status: DC
  Administered 2020-09-08: 25 mg via ORAL
  Filled 2020-09-08: qty 1

## 2020-09-08 NOTE — Evaluation (Addendum)
Physical Therapy Evaluation Patient Details Name: Michael Gilbert MRN: [id number] DOB: [birth year] Today's Date: 09/08/2020   History of Present Illness  Pt is a 86 y/o M admitted on 09/05/20 s/p fall at home with pt hitting his head & LOC while on Eliquis. Pt found to have facial fx. Pt admitted for tx for syncope & collapse suspected to slow ventricular rate of a-flutter. Pt underwent micra pacemaker placement on 09/07/20. PMH: HTN, a-fib a flutter with bradycardia anticoagulated on eliquis, COPD, CKD stage 3, OSA  Clinical Impression  Pt is very pleasant & agreeable to tx. Pt can ambulate increased distances without AD with CGA. Pt reports he feels slightly unsteady on his feet but possibly due to lying in bed for a few days. Pt without any overt LOB during gait. BP = 82 bpm at rest increased to 102 bpm with gait. Recommending OPPT for high level balance training following d/c from hospital to allow return to PLOF as pt was ambulating 1/2 to 1 mile per day with SPC.    Follow Up Recommendations Outpatient PT;Supervision for mobility/OOB    Equipment Recommendations  None recommended by PT    Recommendations for Other Services       Precautions / Restrictions Precautions Precautions: None Restrictions Weight Bearing Restrictions: No      Mobility  Bed Mobility Overal bed mobility: Modified Independent             General bed mobility comments: HOB elevated, bed rails    Transfers Overall transfer level: Needs assistance   Transfers: Sit to/from Stand Sit to Stand: Min guard            Ambulation/Gait Ambulation/Gait assistance: Min guard Gait Distance (Feet): 300 Feet Assistive device: Rolling walker (2 wheeled) Gait Pattern/deviations: Decreased stride length        Stairs            Wheelchair Mobility    Modified Rankin (Stroke Patients Only)       Balance Overall balance assessment: Needs assistance;Mild deficits observed, not formally tested            Standing balance-Leahy Scale: Good                               Pertinent Vitals/Pain Pain Assessment: Faces Faces Pain Scale: Hurts a little bit Pain Location: R rib Pain Descriptors / Indicators: Sore Pain Intervention(s): Monitored during session    Home Living Family/patient expects to be discharged to:: Private residence Living Arrangements: Spouse/significant other Available Help at Discharge: Family Type of Home: House Home Access: Stairs to enter Entrance Stairs-Rails: Psychiatric nurse of Steps: 2 Home Layout: One level        Prior Function Level of Independence: Independent without AD in home, using SPC to ambulate 1/2 to 1 mile per day prior to admission              Hand Dominance        Extremity/Trunk Assessment   Upper Extremity Assessment Upper Extremity Assessment: Overall WFL for tasks assessed    Lower Extremity Assessment Lower Extremity Assessment: Generalized weakness    Cervical / Trunk Assessment Cervical / Trunk Assessment: Kyphotic  Communication   Communication: No difficulties  Cognition Arousal/Alertness: Awake/alert Behavior During Therapy: WFL for tasks assessed/performed Overall Cognitive Status: Within Functional Limits for tasks assessed  General Comments      Exercises     Assessment/Plan    PT Assessment Patient needs continued PT services  PT Problem List Decreased balance;Decreased activity tolerance       PT Treatment Interventions Therapeutic exercise;Balance training;Gait training;Stair training;Patient/family education    PT Goals (Current goals can be found in the Care Plan section)  Acute Rehab PT Goals Patient Stated Goal: to go home PT Goal Formulation: With patient Time For Goal Achievement: 09/22/20 Potential to Achieve Goals: Good    Frequency Min 2X/week   Barriers to discharge         Co-evaluation               AM-PAC PT "6 Clicks" Mobility  Outcome Measure Help needed turning from your back to your side while in a flat bed without using bedrails?: None Help needed moving from lying on your back to sitting on the side of a flat bed without using bedrails?: None Help needed moving to and from a bed to a chair (including a wheelchair)?: A Little Help needed standing up from a chair using your arms (e.g., wheelchair or bedside chair)?: None Help needed to walk in hospital room?: A Little Help needed climbing 3-5 steps with a railing? : A Little 6 Click Score: 21    End of Session Equipment Utilized During Treatment: Gait belt Activity Tolerance: Patient tolerated treatment well Patient left: in bed;with call bell/phone within reach;with bed alarm set;with family/visitor present Nurse Communication: Mobility status PT Visit Diagnosis: Unsteadiness on feet (R26.81);Other abnormalities of gait and mobility (R26.89)    Time: 8110-3159 PT Time Calculation (min) (ACUTE ONLY): 17 min   Charges:   PT Evaluation $PT Eval Low Complexity: Hoople, PT, DPT 09/08/20, 12:40 PM   Waunita Schooner 09/08/2020, 12:38 PM

## 2020-09-08 NOTE — Progress Notes (Signed)
Michael Gilbert to be D/C'd Home per MD order.  Discussed prescriptions and follow up appointments with the patient. Prescriptions given to patient, medication list explained in detail. Pt verbalized understanding.  Allergies as of 09/08/2020      Reactions   Ace Inhibitors Cough   Lovastatin Other (See Comments)   Other reaction(s): Muscle Pain   Simvastatin    Other reaction(s): Muscle Pain      Medication List    STOP taking these medications   fluticasone 50 MCG/ACT nasal spray Commonly known as: FLONASE     TAKE these medications   Eliquis 2.5 MG Tabs tablet Generic drug: apixaban Take 1 tablet (2.5 mg total) by mouth 2 (two) times daily. Start taking on: September 09, 2020 What changed: See the new instructions.   losartan 25 MG tablet Commonly known as: COZAAR Take 25 mg by mouth daily.   Multi-Vitamins Tabs Take 1 tablet by mouth daily.   omeprazole 40 MG capsule Commonly known as: PRILOSEC Take 40 mg by mouth daily.   pantoprazole 40 MG tablet Commonly known as: Protonix Take 1 tablet (40 mg total) by mouth daily.   tamsulosin 0.4 MG Caps capsule Commonly known as: FLOMAX TAKE 2 CAPSULES EVERY DAY       Vitals:   09/08/20 0400 09/08/20 0843  BP: (!) 146/79 (!) 143/80  Pulse: (!) 59 (!) 59  Resp: 16 16  Temp: 97.9 F (36.6 C) (!) 97.5 F (36.4 C)  SpO2: 94% 97%    Tele box removed. Skin clean, dry and intact without evidence of skin break down, no evidence of skin tears noted. IV catheter discontinued intact. Site without signs and symptoms of complications. Dressing and pressure applied. Pt denies pain at this time. No complaints noted.  An After Visit Summary was printed and given to the patient. Patient escorted via [place], and D/C home via private auto.  [name]

## 2020-09-08 NOTE — Discharge Summary (Signed)
Physician Discharge Summary  Michael Gilbert XTG:[id number] DOB: 1928-04-09 DOA: 09/05/2020  PCP: Derinda Late, MD  Admit date: 09/05/2020 Discharge date: 09/08/2020  Time spent: 40 minutes  Recommendations for Outpatient Follow-up:  1. Follow outpatient CBC/CMP 2. Follow with cardiology in 1 week 3. Follow with ENT outpatient for facial fractures  4. Follow up with outpatient therapy 5. Follow abnormal CT findings (COPD and interstitial lung disease), consider pulm follow up outpatient 6. Follow stenosis of spine outpatient  Discharge Diagnoses:  Principal Problem:   Syncope and collapse Active Problems:   HTN (hypertension), benign   GERD (gastroesophageal reflux disease)   OSA (obstructive sleep apnea)   Atrial flutter (HCC)   CKD (chronic kidney disease) stage 3, GFR 30-59 ml/min (HCC)   Slow ventricular response   Sick sinus syndrome ([place])   Discharge Condition: stable  Diet recommendation: heart healthy  Filed Weights   09/06/20 1600 09/07/20 0509 09/08/20 0400  Weight: 78.5 kg 78.6 kg 77.6 kg    History of present illness:  Michael Gilbert 86 y.o.malewith medical history significant ofatrial flutter on Eliquis and htn for syncope and collapse with fall on face with facial Fracture and cardiology Dr.Kowalski Consulted by edmd Dr.smith. Syncope was most likely in setting of his bradycardia and now he's s/p pacemaker placement on 11/4.    Hospital Course:  Atrial Flutter with Slow Ventricular Response   Sick Sinus Syndrome   Syncope likely 2/2 Bradycardia:  EKG with flutter with rate in 30's  Negative troponin x1 Negative orthostatics Echo with EF 55-60%, no RWMA (see report) Cardiology c/s, appreciate recommendations -  S/p Micra pacemaker 11/4 Anticoagulation can resume on 11/6 Cardiology recommending outpatient follow up with cardiology in 1 week  Atrial Flutter:  Currently rate controlled, now s/p pacemaker placement Holding eliquis per cardiology  recommendations with procedure Follow for resumption chadvasc 3 (age, HTN) --eliquis to resume 11/6 per cards  Hypertension: --resume home losartan  Acute Fractures of the Lateral Rim, Lateral Wall, and Inferior Rim/Floor of R Orbit   Acute Fractures of the Anterior and Posterolateral Walls of the R Maxillary Sinus:  Discussed with Dr. Richardson Landry with ENT by EDP (Dr. Tamala Julian) who noted there was no indication for surgery, can be managed outpatient  OSA  Discussed with Dr. Richardson Landry 11/3, recommending holding CPAP for 7-10 days --cont to hold CPAP  Abnormal CT scan: findings suggestive of underlying COPD and interstitial lung disease -> needs outpatient follow up  Procedures: 11/4  Successful implantation of Micra AV leadless pacemaker  Echo IMPRESSIONS    1. Left ventricular ejection fraction, by estimation, is 55 to 60%. The  left ventricle has normal function. The left ventricle has no regional  wall motion abnormalities. Left ventricular diastolic parameters were  normal.  2. Right ventricular systolic function is normal. The right ventricular  size is normal.  3. The mitral valve is normal in structure. Mild mitral valve  regurgitation.  4. The aortic valve is normal in structure. Aortic valve regurgitation is  trivial.  Consultations:  Cardiology  ENT over phone  Discharge Exam: Vitals:   09/08/20 0400 09/08/20 0843  BP: (!) 146/79 (!) 143/80  Pulse: (!) 59 (!) 59  Resp: 16 16  Temp: 97.9 F (36.6 C) (!) 97.5 F (36.4 C)  SpO2: 94% 97%   Feels well, eager to go home [name] Discussed d/c plan  General: No acute distress. Periorbital echymoses to R eye Cardiovascular: Heart sounds show Michael Gilbert regular rate, and  rhythm Lungs: Clear to auscultation bilaterally. Abdomen: Soft, nontender, nondistended  Neurological: Alert and oriented 3. Moves all extremities 4. Cranial nerves II through XII grossly intact. Skin: Warm and dry. No rashes or  lesions. Extremities: No clubbing or cyanosis. No edema   Discharge Instructions   Discharge Instructions    Ambulatory referral to Physical Therapy   Complete by: As directed    Call MD for:  difficulty breathing, headache or visual disturbances   Complete by: As directed    Call MD for:  extreme fatigue   Complete by: As directed    Call MD for:  hives   Complete by: As directed    Call MD for:  persistant dizziness or light-headedness   Complete by: As directed    Call MD for:  persistant nausea and vomiting   Complete by: As directed    Call MD for:  redness, tenderness, or signs of infection (pain, swelling, redness, odor or green/yellow discharge around incision site)   Complete by: As directed    Call MD for:  severe uncontrolled pain   Complete by: As directed    Call MD for:  temperature >100.4   Complete by: As directed    Diet - low sodium heart healthy   Complete by: As directed    Discharge instructions   Complete by: As directed    You were seen for an episode of fainting that was likely due to your slow heart rate.   You've now had Michael Gilbert pacemaker placed.  Please follow up with your cardiologist in 1 week.  Resume your eliquis tomorrow.   Please call for Michael Gilbert follow up appointment with Dr. Clayborn Gilbert from cardiology.  Please call for Michael Gilbert follow up appointment with Dr. Tami Gilbert from ENT for your facial fractures.  You had an abnormal CT scan that showed evidence of possible COPD or interstitial lung disease, please follow up with your PCP regarding this.  Return for new, recurrent, or worsening symptoms.  Please ask your PCP to request records from this hospitalization so they know what was done and what the next steps will be.    Do not use your CPAP for the next 10 days.  Please follow up with ENT outpatient for your facial fractures.   Increase activity slowly   Complete by: As directed      Allergies as of 09/08/2020      Reactions   Ace Inhibitors Cough    Lovastatin Other (See Comments)   Other reaction(s): Muscle Pain   Simvastatin    Other reaction(s): Muscle Pain      Medication List    STOP taking these medications   fluticasone 50 MCG/ACT nasal spray Commonly known as: FLONASE     TAKE these medications   Eliquis 2.5 MG Tabs tablet Generic drug: apixaban Take 1 tablet (2.5 mg total) by mouth 2 (two) times daily. Start taking on: September 09, 2020 What changed: See the new instructions.   losartan 25 MG tablet Commonly known as: COZAAR Take 25 mg by mouth daily.   Multi-Vitamins Tabs Take 1 tablet by mouth daily.   omeprazole 40 MG capsule Commonly known as: PRILOSEC Take 40 mg by mouth daily.   pantoprazole 40 MG tablet Commonly known as: Protonix Take 1 tablet (40 mg total) by mouth daily.   tamsulosin 0.4 MG Caps capsule Commonly known as: FLOMAX TAKE 2 CAPSULES EVERY DAY      Allergies  Allergen Reactions   Ace Inhibitors Cough  Lovastatin Other (See Comments)    Other reaction(s): Muscle Pain   Simvastatin     Other reaction(s): Muscle Pain    Follow-up Information    Callwood, Dwayne D, MD Follow up in 1 week(s).   Specialties: Cardiology, Internal Medicine Contact information: [place] [address] [phone]        Derinda Late, MD. Call.   Specialty: Family Medicine Why: call for follow up  Contact information: [address] [facility] [place] [address] [phone]        Beverly Gust, MD Follow up.   Specialty: Otolaryngology Why: Please call for Ramie Erman follow up appointment  Contact information: [place] [address] [phone]                The results of significant diagnostics from this hospitalization (including imaging, microbiology, ancillary and laboratory) are listed below for reference.    Significant Diagnostic Studies: CT HEAD WO  CONTRAST  Result Date: 09/05/2020 CLINICAL DATA:  Head trauma, loss of consciousness. Facial trauma. Neck trauma. Additional history provided: Ground level fall, injury to right face/eye, TMJ pain. EXAM: CT HEAD WITHOUT CONTRAST CT MAXILLOFACIAL WITHOUT CONTRAST CT CERVICAL SPINE WITHOUT CONTRAST TECHNIQUE: Multidetector CT imaging of the head, cervical spine, and maxillofacial structures were performed using the standard protocol without intravenous contrast. Multiplanar CT image reconstructions of the cervical spine and maxillofacial structures were also generated. COMPARISON:  MRI/MRA head 08/19/2010. FINDINGS: CT HEAD FINDINGS Brain: Mild generalized cerebral atrophy. Advanced ill-defined hypoattenuation within the cerebral white matter is nonspecific, but compatible with chronic small vessel ischemic disease. There is no acute intracranial hemorrhage. No demarcated cortical infarct. No extra-axial fluid collection. No evidence of intracranial mass. No midline shift. Vascular: No hyperdense vessel.  Atherosclerotic calcifications. Skull: Normal. Negative for fracture or focal lesion. Other: Trace fluid within the inferior right mastoid air cells. CT MAXILLOFACIAL FINDINGS Osseous: Minimally displaced acute fracture of the lateral right orbital rim at the expected location of the zygomaticofrontal suture (series 8, image 28). Acute, comminuted, mildly displaced fractures of the lateral wall of the right orbit (series 3, images 20-24). Acute comminuted and mildly displaced fractures of the posterolateral wall of the right maxillary sinus. Acute, comminuted and mildly displaced fractures of the anterior wall the right maxillary sinus. Suspect associated nondisplaced acute fractures of the inferior right orbital rim and right orbital floor, involving the right infraorbital foramen. Orbits: There are tiny foci of extraconal gas and hemorrhage within the inferior right orbit (for instance as seen on series 6, image  31). The globes are normal in size and contour. The extraocular muscles and optic nerve sheath complexes are symmetric and unremarkable. Sinuses: Near complete opacification of the right maxillary sinus with hyperdense fluid, likely reflecting blood products. Mild ethmoid and left maxillary sinus mucosal thickening. Small amount of frothy secretions within the left maxillary sinus. Soft tissues: Prominent right periorbital and pre maxillary soft tissue swelling. Small foci of gas within the soft tissues ventral and posterior to the maxillary sinus. CT CERVICAL SPINE FINDINGS Alignment: Reversal of the expected cervical lordosis. Trace C2-C3 and C3-C4 grade 1 anterolisthesis. Trace C4-C5 grade 1 retrolisthesis. Trace T1-T2 grade 1 anterolisthesis. Skull base and vertebrae: The basion-dental and atlanto-dental intervals are maintained.No evidence of acute fracture to the cervical spine. Small T2 superior endplate Schmorl node. Soft tissues and spinal canal: No prevertebral fluid or swelling. No visible canal hematoma. Disc levels: Spondylosis  with multilevel disc space narrowing, posterior disc osteophytes, uncovertebral hypertrophy and facet arthrosis. Disc space narrowing is severe at C4-C5 and C6-C7. Upper chest: Reported separately. No consolidation within the imaged lung apices. No visible pneumothorax IMPRESSION: CT head: 1. No evidence of acute intracranial abnormality. 2. Advanced cerebral white matter chronic small vessel ischemic disease 3. Mild cerebral atrophy. 4. Trace right mastoid effusion. CT maxillofacial: 1. Acute fractures of the lateral rim, lateral wall and likely inferior rim/floor of the right orbit as described. 2. Acute fractures of the anterior and posterolateral walls of the right maxillary sinus as described. 3. Prominent right periorbital and pre maxillary soft tissue swelling. 4. Small amount of extraconal gas and hemorrhage within the inferior right orbit. 5. Blood products near  completely opacify the right maxillary sinus. CT cervical spine: 1. No evidence of acute fracture to the cervical spine. 2. Nonspecific reversal of the expected cervical lordosis. Mild multilevel grade 1 spondylolisthesis as detailed. 3. Cervical spondylosis as described. Electronically Signed   By: Kellie Simmering DO   On: 09/05/2020 12:55   CT Cervical Spine Wo Contrast  Result Date: 09/05/2020 CLINICAL DATA:  Head trauma, loss of consciousness. Facial trauma. Neck trauma. Additional history provided: Ground level fall, injury to right face/eye, TMJ pain. EXAM: CT HEAD WITHOUT CONTRAST CT MAXILLOFACIAL WITHOUT CONTRAST CT CERVICAL SPINE WITHOUT CONTRAST TECHNIQUE: Multidetector CT imaging of the head, cervical spine, and maxillofacial structures were performed using the standard protocol without intravenous contrast. Multiplanar CT image reconstructions of the cervical spine and maxillofacial structures were also generated. COMPARISON:  MRI/MRA head 08/19/2010. FINDINGS: CT HEAD FINDINGS Brain: Mild generalized cerebral atrophy. Advanced ill-defined hypoattenuation within the cerebral white matter is nonspecific, but compatible with chronic small vessel ischemic disease. There is no acute intracranial hemorrhage. No demarcated cortical infarct. No extra-axial fluid collection. No evidence of intracranial mass. No midline shift. Vascular: No hyperdense vessel.  Atherosclerotic calcifications. Skull: Normal. Negative for fracture or focal lesion. Other: Trace fluid within the inferior right mastoid air cells. CT MAXILLOFACIAL FINDINGS Osseous: Minimally displaced acute fracture of the lateral right orbital rim at the expected location of the zygomaticofrontal suture (series 8, image 28). Acute, comminuted, mildly displaced fractures of the lateral wall of the right orbit (series 3, images 20-24). Acute comminuted and mildly displaced fractures of the posterolateral wall of the right maxillary sinus. Acute,  comminuted and mildly displaced fractures of the anterior wall the right maxillary sinus. Suspect associated nondisplaced acute fractures of the inferior right orbital rim and right orbital floor, involving the right infraorbital foramen. Orbits: There are tiny foci of extraconal gas and hemorrhage within the inferior right orbit (for instance as seen on series 6, image 31). The globes are normal in size and contour. The extraocular muscles and optic nerve sheath complexes are symmetric and unremarkable. Sinuses: Near complete opacification of the right maxillary sinus with hyperdense fluid, likely reflecting blood products. Mild ethmoid and left maxillary sinus mucosal thickening. Small amount of frothy secretions within the left maxillary sinus. Soft tissues: Prominent right periorbital and pre maxillary soft tissue swelling. Small foci of gas within the soft tissues ventral and posterior to the maxillary sinus. CT CERVICAL SPINE FINDINGS Alignment: Reversal of the expected cervical lordosis. Trace C2-C3 and C3-C4 grade 1 anterolisthesis. Trace C4-C5 grade 1 retrolisthesis. Trace T1-T2 grade 1 anterolisthesis. Skull base and vertebrae: The basion-dental and atlanto-dental intervals are maintained.No evidence of acute fracture to the cervical spine. Small T2 superior endplate Schmorl node. Soft tissues and spinal  canal: No prevertebral fluid or swelling. No visible canal hematoma. Disc levels: Spondylosis with multilevel disc space narrowing, posterior disc osteophytes, uncovertebral hypertrophy and facet arthrosis. Disc space narrowing is severe at C4-C5 and C6-C7. Upper chest: Reported separately. No consolidation within the imaged lung apices. No visible pneumothorax IMPRESSION: CT head: 1. No evidence of acute intracranial abnormality. 2. Advanced cerebral white matter chronic small vessel ischemic disease 3. Mild cerebral atrophy. 4. Trace right mastoid effusion. CT maxillofacial: 1. Acute fractures of the  lateral rim, lateral wall and likely inferior rim/floor of the right orbit as described. 2. Acute fractures of the anterior and posterolateral walls of the right maxillary sinus as described. 3. Prominent right periorbital and pre maxillary soft tissue swelling. 4. Small amount of extraconal gas and hemorrhage within the inferior right orbit. 5. Blood products near completely opacify the right maxillary sinus. CT cervical spine: 1. No evidence of acute fracture to the cervical spine. 2. Nonspecific reversal of the expected cervical lordosis. Mild multilevel grade 1 spondylolisthesis as detailed. 3. Cervical spondylosis as described. Electronically Signed   By: Kellie Simmering DO   On: 09/05/2020 12:55   CT CHEST ABDOMEN PELVIS W CONTRAST  Result Date: 09/05/2020 CLINICAL DATA:  86 year old male with history of trauma from Dawsyn Ramsaran fall earlier today. Patient is on anticoagulation. EXAM: CT CHEST, ABDOMEN, AND PELVIS WITH CONTRAST TECHNIQUE: Multidetector CT imaging of the chest, abdomen and pelvis was performed following the standard protocol during bolus administration of intravenous contrast. CONTRAST:  17mL OMNIPAQUE IOHEXOL 300 MG/ML  SOLN COMPARISON:  No prior chest CT. CT the abdomen and pelvis 12/06/2017. FINDINGS: CT CHEST FINDINGS Cardiovascular: Heart size is normal. There is no significant pericardial fluid, thickening or pericardial calcification. No acute abnormality of the thoracic aorta. There is aortic atherosclerosis, as well as atherosclerosis of the great vessels of the mediastinum and the coronary arteries, including calcified atherosclerotic plaque in the left main, left anterior descending and left circumflex coronary arteries. Thickening and calcification of the aortic valve. Mediastinum/Nodes: No high attenuation fluid collection within the mediastinum to suggest significant posttraumatic hemorrhage. No pathologically enlarged mediastinal or hilar lymph nodes. Esophagus is unremarkable in  appearance. No axillary lymphadenopathy. Lungs/Pleura: No pneumothorax. No acute consolidative airspace disease. No hemothorax or pleural effusions. Diffuse bronchial wall thickening with mild to moderate centrilobular and paraseptal emphysema. In addition, there are patchy areas of ground-glass attenuation, septal thickening, traction bronchiectasis and peripheral bronchiolectasis most evident throughout the mid to lower lungs bilaterally, indicative of underlying interstitial lung disease. Musculoskeletal: No acute displaced fractures or aggressive appearing lytic or blastic lesions are noted in the visualized portions of the skeleton. CT ABDOMEN PELVIS FINDINGS Hepatobiliary: No evidence of significant acute traumatic injury to the liver. Multiple subcentimeter low-attenuation lesions scattered throughout the hepatic parenchyma, similar in size and number to the prior examination, statistically likely to represent cysts. No other larger suspicious appearing hepatic lesions. No intra or extrahepatic biliary ductal dilatation. Gallbladder is normal in appearance. Pancreas: No evidence of significant acute traumatic injury to the pancreas. No pancreatic mass. No pancreatic ductal dilatation. No pancreatic or peripancreatic fluid collections or inflammatory changes. Spleen: Unremarkable. Specifically, no evidence of acute traumatic injury to the spleen. Adrenals/Urinary Tract: No evidence of acute traumatic injury to either kidney or adrenal gland. 1.6 cm exophytic low-attenuation lesion in the lower pole of the left kidney, compatible with Marquasha Brutus simple cyst. Right kidney and bilateral adrenal glands are normal in appearance. No hydroureteronephrosis. Urinary bladder is intact and normal in  appearance. Stomach/Bowel: No definitive evidence of acute traumatic injury to the hollow viscera. The appearance of the stomach is normal. There is no pathologic dilatation of small bowel or colon. Numerous colonic diverticulae are  noted, without surrounding inflammatory changes to suggests an acute diverticulitis at this time. The appendix is not confidently identified and may be surgically absent. Regardless, there are no inflammatory changes noted adjacent to the cecum to suggest the presence of an acute appendicitis at this time. Vascular/Lymphatic: Aortic atherosclerosis, without evidence of aneurysm or dissection in the abdominal or pelvic vasculature. No lymphadenopathy noted in the abdomen or pelvis. Reproductive: Prostate gland is severely enlarged with median lobe hypertrophy measuring 6.6 x 4.6 x 7.4 cm. Seminal vesicles are unremarkable in appearance. Other: No significant volume of ascites.  No pneumoperitoneum. Musculoskeletal: There are no acute displaced fractures or aggressive appearing lytic or blastic lesions noted in the visualized portions of the skeleton. IMPRESSION: 1. No definite evidence of significant acute traumatic injury to the chest, abdomen or pelvis. 2. Mild diffuse bronchial wall thickening with mild to moderate centrilobular and paraseptal emphysema; imaging findings suggestive of underlying COPD. 3. In addition, there is evidence of interstitial lung disease, with Issabela Lesko spectrum of findings categorized as probable usual interstitial pneumonia (UIP) per current ATS guidelines. 4. Colonic diverticulosis without evidence of acute diverticulitis at this time. 5. Aortic atherosclerosis, in addition to left main, left anterior descending and left circumflex coronary artery disease. 6. There are calcifications of the aortic valve. Echocardiographic correlation for evaluation of potential valvular dysfunction may be warranted if clinically indicated. 7. Prostatomegaly with median lobe hypertrophy. 8. Additional incidental findings, as above. Electronically Signed   By: Vinnie Langton M.D.   On: 09/05/2020 12:52   EP PPM/ICD IMPLANT  Result Date: 09/07/2020 1.  Successful implantation of Micra AV leadless  pacemaker  CT T-SPINE NO CHARGE  Result Date: 09/05/2020 CLINICAL DATA:  86 year old male status post ground level fall. Pain. EXAM: CT THORACIC SPINE WITH CONTRAST TECHNIQUE: Multiplanar CT images of the thoracic spine were reconstructed from contemporary CT of the Chest. CONTRAST:  None or No additional COMPARISON:  CT cervical, chest, Abdomen, and Pelvis today are reported separately. FINDINGS: Limited cervical spine imaging: Cervicothoracic junction alignment is within normal limits. Thoracic spine segmentation:  Normal. Alignment: Preserved thoracic kyphosis. No spondylolisthesis *CRASH * that there is subtle degenerative appearing anterolisthesis of T1 on T2, with associated chronic facet and endplate degeneration. No other spondylolisthesis. Vertebrae: No acute osseous abnormality identified. Visible posterior ribs also appear intact. There is ankylosis related to anterior endplate osteophytes from the T6 to the T10 level. Possible developing facet ankylosis also on the left at T1-T2. Paraspinal and other soft tissues: Chest and upper abdominal viscera reported separately. Thoracic paraspinal soft tissues are within normal limits. Disc levels: Mild grade 1 anterolisthesis of T1 on T2 with moderate to severe disc space loss, and endplate spurring on the right. Moderate facet hypertrophy. However, evidence of developing left side facet ankylosis. Mild bilateral T1 foraminal stenosis. Elsewhere mild for age thoracic spine degeneration. No CT evidence of thoracic spinal stenosis. IMPRESSION: 1. No acute traumatic injury identified in the thoracic spine. CT Chest, Abdomen, and Pelvis are reported separately. 2. Mild anterolisthesis at T1-T2 with disc and facet degeneration. Elsewhere mild for age thoracic spine degeneration. No CT evidence of thoracic spinal stenosis. Electronically Signed   By: Genevie Ann M.D.   On: 09/05/2020 12:43   CT L-SPINE NO CHARGE  Result Date: 09/05/2020 CLINICAL DATA:  86 year old  male status post ground level fall. Pain. EXAM: CT LUMBAR SPINE WITH CONTRAST TECHNIQUE: Technique: Multiplanar CT images of the lumbar spine were reconstructed from contemporary CT of the Abdomen and Pelvis. CONTRAST:  No additional COMPARISON:  CT thoracic spine, chest, Abdomen, and Pelvis today are reported separately. CT Abdomen and Pelvis 12/06/2017. FINDINGS: Segmentation: Normal, concordant with the thoracic numbering today. Alignment: Straightening of lumbar lordosis. Mild dextroconvex lumbar scoliosis. No spondylolisthesis. Vertebrae: The lumbar vertebrae appear stable since 2019 and intact. Visible sacrum and SI joints also appear intact. Paraspinal and other soft tissues: Abdominal and pelvic viscera reported separately today. Lumbar paraspinal soft tissues remain normal. Disc levels: Chronic lumbar spine degeneration maximal at L2-L3, L4-L5, and L5-S1 does not appear significantly changed from the 2019 CT. There is moderate to severe multifactorial spinal stenosis suspected at L4-L5. IMPRESSION: 1. No acute traumatic injury identified in the lumbar spine. 2.  CT Abdomen and Pelvis today are reported separately. 3. 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 Electronically Signed   By: Genevie Ann M.D.   On: 09/05/2020 12:49   ECHOCARDIOGRAM COMPLETE  Result Date: 09/06/2020    ECHOCARDIOGRAM REPORT   Patient Name:   MOHD. DERFLINGER [facility] Date of Exam: 09/05/2020 Medical Rec #:  [id number]     Height:       72.0 in Accession #:    [id number]    Weight:       170.0 lb Date of Birth:  1928-05-05      BSA:          1.988 m Patient Age:    61 years      BP:           106/90 mmHg Patient Gender: M             HR:           58 bpm. Exam Location:  [facility] Procedure: 2D Echo, Cardiac Doppler and Color Doppler Indications:     R55 Syncope  History:         Patient has no prior history of Echocardiogram examinations.                   Risk Factors:Hypertension. Bradycardia.  Sonographer:     Wilford Sports Rodgers-Jones Referring Phys:  Friend Diagnosing Phys: Serafina Royals MD IMPRESSIONS  1. Left ventricular ejection fraction, by estimation, is 55 to 60%. The left ventricle has normal function. The left ventricle has no regional wall motion abnormalities. Left ventricular diastolic parameters were normal.  2. Right ventricular systolic function is normal. The right ventricular size is normal.  3. The mitral valve is normal in structure. Mild mitral valve regurgitation.  4. The aortic valve is normal in structure. Aortic valve regurgitation is trivial. FINDINGS  Left Ventricle: Left ventricular ejection fraction, by estimation, is 55 to 60%. The left ventricle has normal function. The left ventricle has no regional wall motion abnormalities. The left ventricular internal cavity size was normal in size. There is  no left ventricular hypertrophy. Left ventricular diastolic parameters were normal. Right Ventricle: The right ventricular size is normal. No increase in right ventricular wall thickness. Right ventricular systolic function is normal. Left Atrium: Left atrial size was normal in size. Right Atrium: Right atrial size was normal in size. Pericardium: There is no evidence of pericardial effusion. Mitral Valve: The mitral valve is normal in structure. Mild mitral valve regurgitation.  Tricuspid Valve: The tricuspid valve is normal in structure. Tricuspid valve regurgitation is mild. Aortic Valve: The aortic valve is normal in structure. Aortic valve regurgitation is trivial. Pulmonic Valve: The pulmonic valve was normal in structure. Pulmonic valve regurgitation is not visualized. Aorta: The aortic root and ascending aorta are structurally normal, with no evidence of dilitation. IAS/Shunts: No atrial level shunt detected by color flow Doppler.  LEFT VENTRICLE PLAX 2D LVIDd:         4.69 cm LVIDs:         2.78 cm LV PW:         0.81 cm  LV IVS:        0.83 cm LVOT diam:     2.30 cm LV SV:         90 LV SV Index:   45 LVOT Area:     4.15 cm  RIGHT VENTRICLE RV Basal diam:  5.22 cm RV S prime:     21.05 cm/s TAPSE (M-mode): 4.0 cm LEFT ATRIUM              Index       RIGHT ATRIUM           Index LA diam:        5.40 cm  2.72 cm/m  RA Area:     27.10 cm LA Vol (A2C):   119.0 ml 59.85 ml/m RA Volume:   102.00 ml 51.30 ml/m LA Vol (A4C):   101.0 ml 50.80 ml/m LA Biplane Vol: 110.0 ml 55.32 ml/m  AORTIC VALVE LVOT Vmax:   99.95 cm/s LVOT Vmean:  72.950 cm/s LVOT VTI:    0.217 m  AORTA Ao Root diam: 3.90 cm Ao Asc diam:  4.30 cm MV E velocity: 89.97 cm/s  TRICUSPID VALVE                            TR Peak grad:   46.5 mmHg                            TR Vmax:        341.00 cm/s                             SHUNTS                            Systemic VTI:  0.22 m                            Systemic Diam: 2.30 cm Serafina Royals MD Electronically signed by Serafina Royals MD Signature Date/Time: 09/06/2020/7:16:58 AM    Final    CT Maxillofacial Wo Contrast  Result Date: 09/05/2020 CLINICAL DATA:  Head trauma, loss of consciousness. Facial trauma. Neck trauma. Additional history provided: Ground level fall, injury to right face/eye, TMJ pain. EXAM: CT HEAD WITHOUT CONTRAST CT MAXILLOFACIAL WITHOUT CONTRAST CT CERVICAL SPINE WITHOUT CONTRAST TECHNIQUE: Multidetector CT imaging of the head, cervical spine, and maxillofacial structures were performed using the standard protocol without intravenous contrast. Multiplanar CT image reconstructions of the cervical spine and maxillofacial structures were also generated. COMPARISON:  MRI/MRA head 08/19/2010. FINDINGS: CT HEAD FINDINGS Brain: Mild generalized cerebral atrophy. Advanced ill-defined hypoattenuation within the cerebral white matter is nonspecific, but compatible with chronic  small vessel ischemic disease. There is no acute intracranial hemorrhage. No demarcated cortical infarct. No extra-axial fluid  collection. No evidence of intracranial mass. No midline shift. Vascular: No hyperdense vessel.  Atherosclerotic calcifications. Skull: Normal. Negative for fracture or focal lesion. Other: Trace fluid within the inferior right mastoid air cells. CT MAXILLOFACIAL FINDINGS Osseous: Minimally displaced acute fracture of the lateral right orbital rim at the expected location of the zygomaticofrontal suture (series 8, image 28). Acute, comminuted, mildly displaced fractures of the lateral wall of the right orbit (series 3, images 20-24). Acute comminuted and mildly displaced fractures of the posterolateral wall of the right maxillary sinus. Acute, comminuted and mildly displaced fractures of the anterior wall the right maxillary sinus. Suspect associated nondisplaced acute fractures of the inferior right orbital rim and right orbital floor, involving the right infraorbital foramen. Orbits: There are tiny foci of extraconal gas and hemorrhage within the inferior right orbit (for instance as seen on series 6, image 31). The globes are normal in size and contour. The extraocular muscles and optic nerve sheath complexes are symmetric and unremarkable. Sinuses: Near complete opacification of the right maxillary sinus with hyperdense fluid, likely reflecting blood products. Mild ethmoid and left maxillary sinus mucosal thickening. Small amount of frothy secretions within the left maxillary sinus. Soft tissues: Prominent right periorbital and pre maxillary soft tissue swelling. Small foci of gas within the soft tissues ventral and posterior to the maxillary sinus. CT CERVICAL SPINE FINDINGS Alignment: Reversal of the expected cervical lordosis. Trace C2-C3 and C3-C4 grade 1 anterolisthesis. Trace C4-C5 grade 1 retrolisthesis. Trace T1-T2 grade 1 anterolisthesis. Skull base and vertebrae: The basion-dental and atlanto-dental intervals are maintained.No evidence of acute fracture to the cervical spine. Small T2 superior endplate  Schmorl node. Soft tissues and spinal canal: No prevertebral fluid or swelling. No visible canal hematoma. Disc levels: Spondylosis with multilevel disc space narrowing, posterior disc osteophytes, uncovertebral hypertrophy and facet arthrosis. Disc space narrowing is severe at C4-C5 and C6-C7. Upper chest: Reported separately. No consolidation within the imaged lung apices. No visible pneumothorax IMPRESSION: CT head: 1. No evidence of acute intracranial abnormality. 2. Advanced cerebral white matter chronic small vessel ischemic disease 3. Mild cerebral atrophy. 4. Trace right mastoid effusion. CT maxillofacial: 1. Acute fractures of the lateral rim, lateral wall and likely inferior rim/floor of the right orbit as described. 2. Acute fractures of the anterior and posterolateral walls of the right maxillary sinus as described. 3. Prominent right periorbital and pre maxillary soft tissue swelling. 4. Small amount of extraconal gas and hemorrhage within the inferior right orbit. 5. Blood products near completely opacify the right maxillary sinus. CT cervical spine: 1. No evidence of acute fracture to the cervical spine. 2. Nonspecific reversal of the expected cervical lordosis. Mild multilevel grade 1 spondylolisthesis as detailed. 3. Cervical spondylosis as described. Electronically Signed   By: Kellie Simmering DO   On: 09/05/2020 12:55    Microbiology: Recent Results (from the past 240 hour(s))  Respiratory Panel by RT PCR (Flu Cohen Boettner&B, Covid) - Nasopharyngeal Swab     Status: None   Collection Time: 09/05/20  1:25 PM   Specimen: Nasopharyngeal Swab  Result Value Ref Range Status   SARS Coronavirus 2 by RT PCR NEGATIVE NEGATIVE Final    Comment: (NOTE) SARS-CoV-2 target nucleic acids are NOT DETECTED.  The SARS-CoV-2 RNA is generally detectable in upper respiratoy specimens during the acute phase of infection. The lowest concentration of SARS-CoV-2 viral copies this assay can detect  is 131 copies/mL. Shaheed Schmuck  negative result does not preclude SARS-Cov-2 infection and should not be used as the sole basis for treatment or other patient management decisions. Omolola Mittman negative result may occur with  improper specimen collection/handling, submission of specimen other than nasopharyngeal swab, presence of viral mutation(s) within the areas targeted by this assay, and inadequate number of viral copies (<131 copies/mL). Nohemi Nicklaus negative result must be combined with clinical observations, patient history, and epidemiological information. The expected result is Negative.  Fact Sheet for Patients:  PinkCheek.be  Fact Sheet for Healthcare Providers:  GravelBags.it  This test is no t yet approved or cleared by the [place] FDA and  has been authorized for detection and/or diagnosis of SARS-CoV-2 by FDA under an Emergency Use Authorization (EUA). This EUA will remain  in effect (meaning this test can be used) for the duration of the COVID-19 declaration under Section 564(b)(1) of the Act, 21 U.S.C. section 360bbb-3(b)(1), unless the authorization is terminated or revoked sooner.     Influenza Mikita Lesmeister by PCR NEGATIVE NEGATIVE Final   Influenza B by PCR NEGATIVE NEGATIVE Final    Comment: (NOTE) The Xpert Xpress SARS-CoV-2/FLU/RSV assay is intended as an aid in  the diagnosis of influenza from Nasopharyngeal swab specimens and  should not be used as Burlin Mcnair sole basis for treatment. Nasal washings and  aspirates are unacceptable for Xpert Xpress SARS-CoV-2/FLU/RSV  testing.  Fact Sheet for Patients: PinkCheek.be  Fact Sheet for Healthcare Providers: GravelBags.it  This test is not yet approved or cleared by the [place] FDA and  has been authorized for detection and/or diagnosis of SARS-CoV-2 by  FDA under an Emergency Use Authorization (EUA). This EUA will remain  in effect (meaning this test can  be used) for the duration of the  Covid-19 declaration under Section 564(b)(1) of the Act, 21  U.S.C. section 360bbb-3(b)(1), unless the authorization is  terminated or revoked. Performed at [facility], [address] [place], [place] [address]   Surgical PCR screen     Status: Abnormal   Collection Time: 09/06/20 10:56 AM   Specimen: Nasal Mucosa; Nasal Swab  Result Value Ref Range Status   MRSA, PCR NEGATIVE NEGATIVE Final   Staphylococcus aureus POSITIVE ([place] Capece) NEGATIVE Final    Comment: (NOTE) The Xpert SA Assay (FDA approved for NASAL specimens in patients 63 years of age and older), is one component of Kyro Joswick comprehensive surveillance program. It is not intended to diagnose infection nor to guide or monitor treatment. Performed at [facility], [address] [place], [place] [address]      Labs: Basic Metabolic Panel: Recent Labs  Lab 09/05/20 1112 09/07/20 0451 09/08/20 0617  NA 135 135 133*  K 4.2 3.8 3.8  CL 106 105 104  CO2 19* 21* 19*  GLUCOSE 111* 93 88  BUN 30* 23 26*  CREATININE 1.33* 1.32* 1.39*  CALCIUM 8.6* 8.5* 9.0  MG  --  2.1 2.0  PHOS  --  3.3  --    Liver Function Tests: Recent Labs  Lab 09/05/20 1325 09/07/20 0451  AST 63* 53*  ALT 29 25  ALKPHOS 83 67  BILITOT 0.9 1.1  PROT 7.9 6.8  ALBUMIN 3.7 3.3*   No results for input(s): LIPASE, AMYLASE in the last 168 hours. No results for input(s): AMMONIA in the last 168 hours. CBC: Recent Labs  Lab 09/05/20 1112 09/07/20 0451 09/08/20 0617  WBC 7.2 6.0 5.9  NEUTROABS  --  3.6  --  HGB 13.6 13.1 14.1  HCT 42.0 39.4 41.9  MCV 90.1 87.8 86.0  PLT 160 160 156   Cardiac Enzymes: No results for input(s): CKTOTAL, CKMB, CKMBINDEX, TROPONINI in the last 168 hours. BNP: BNP (last 3 results) No results for input(s): BNP in the last 8760 hours.  ProBNP (last 3 results) No results for input(s): PROBNP in the last 8760 hours.  CBG: Recent Labs  Lab 09/06/20 0618  09/07/20 0820  GLUCAP 85 83       Signed:  Fayrene Helper MD.  [facility] 09/08/2020, 12:37 PM

## 2020-09-08 NOTE — Progress Notes (Signed)
[facility] Cardiology  SUBJECTIVE: Patient laying in bed, reports feeling good, denies chest pain, shortness of breath, palpitations, presyncope or syncope   Vitals:   09/07/20 1815 09/07/20 1849 09/07/20 1935 09/08/20 0400  BP: (!) 152/85 (!) 179/79 (!) 156/91 (!) 146/79  Pulse: 60 (!) 59 66 (!) 59  Resp: 16 18 19 16   Temp:  97.7 F (36.5 C) 97.6 F (36.4 C) 97.9 F (36.6 C)  TempSrc:  Oral Oral   SpO2: 94% 98% 96% 94%  Weight:    77.6 kg  Height:         Intake/Output Summary (Last 24 hours) at 09/08/2020 0830 Last data filed at 09/08/2020 2094 Gross per 24 hour  Intake 240 ml  Output 575 ml  Net -335 ml      PHYSICAL EXAM  General: Well developed, well nourished, in no acute distress HEENT: Right facial laceration Neck:  No JVD.  Lungs: Clear bilaterally to auscultation and percussion. Heart: HRRR . Normal S1 and S2 without gallops or murmurs.  Abdomen: Bowel sounds are positive, abdomen soft and non-tender  Msk:  Back normal, normal gait. Normal strength and tone for age. Extremities: No clubbing, cyanosis or edema.   Neuro: Alert and oriented X 3. Psych:  Good affect, responds appropriately   LABS: Basic Metabolic Panel: Recent Labs    09/07/20 0451 09/08/20 0617  NA 135 133*  K 3.8 3.8  CL 105 104  CO2 21* 19*  GLUCOSE 93 88  BUN 23 26*  CREATININE 1.32* 1.39*  CALCIUM 8.5* 9.0  MG 2.1 2.0  PHOS 3.3  --    Liver Function Tests: Recent Labs    09/05/20 1325 09/07/20 0451  AST 63* 53*  ALT 29 25  ALKPHOS 83 67  BILITOT 0.9 1.1  PROT 7.9 6.8  ALBUMIN 3.7 3.3*   No results for input(s): LIPASE, AMYLASE in the last 72 hours. CBC: Recent Labs    09/07/20 0451 09/08/20 0617  WBC 6.0 5.9  NEUTROABS 3.6  --   HGB 13.1 14.1  HCT 39.4 41.9  MCV 87.8 86.0  PLT 160 156   Cardiac Enzymes: No results for input(s): CKTOTAL, CKMB, CKMBINDEX, TROPONINI in the last 72 hours. BNP: Invalid input(s): POCBNP D-Dimer: No results for input(s): DDIMER in  the last 72 hours. Hemoglobin A1C: No results for input(s): HGBA1C in the last 72 hours. Fasting Lipid Panel: No results for input(s): CHOL, HDL, LDLCALC, TRIG, CHOLHDL, LDLDIRECT in the last 72 hours. Thyroid Function Tests: No results for input(s): TSH, T4TOTAL, T3FREE, THYROIDAB in the last 72 hours.  Invalid input(s): FREET3 Anemia Panel: No results for input(s): VITAMINB12, FOLATE, FERRITIN, TIBC, IRON, RETICCTPCT in the last 72 hours.  EP PPM/ICD IMPLANT  Result Date: 09/07/2020 1.  Successful implantation of Micra AV leadless pacemaker    Echo LVEF 55 to 60%  TELEMETRY: Atrial flutter with intermittent ventricular pacing:  ASSESSMENT AND PLAN:  Principal Problem:   Syncope and collapse Active Problems:   HTN (hypertension), benign   GERD (gastroesophageal reflux disease)   OSA (obstructive sleep apnea)   Atrial flutter (HCC)   CKD (chronic kidney disease) stage 3, GFR 30-59 ml/min (HCC)   Slow ventricular response   Sick sinus syndrome ([place])    1.  Syncope secondary to marked bradycardia 2.  Bradycardia secondary to sick sinus syndrome 3.  Atrial flutter, on Eliquis for stroke prevention 4.  Micra AV leadless pacemaker implantation 09/07/2020, telemetry reveals underlying atrial flutter with slow ventricular rate with  intermittent ventricular pacing 5.  Right facial fracture and laceration  Recommendations  1.  Agree with current therapy 2.  Ambulate today, if patient does well discharge home 3.  Resume Eliquis 09/09/2020 4.  Follow-up with [name] in 1 week   Isaias Cowman, MD, PhD, [facility] 09/08/2020 8:30 AM

## 2020-09-08 NOTE — Plan of Care (Signed)

## 2020-09-08 NOTE — Care Management Important Message (Signed)
Important Message  Patient Details  Name: Michael Gilbert MRN: [id number] Date of Birth: Oct 07, 1928   Medicare Important Message Given:  Yes     Juliann Pulse A Mac Dowdell 09/08/2020, 10:46 AM

## 2020-09-25 ENCOUNTER — Encounter: Payer: Self-pay | Admitting: Podiatry

## 2020-09-25 ENCOUNTER — Other Ambulatory Visit: Payer: Self-pay

## 2020-09-25 ENCOUNTER — Ambulatory Visit (INDEPENDENT_AMBULATORY_CARE_PROVIDER_SITE_OTHER): Payer: Medicare Other | Admitting: Podiatry

## 2020-09-25 DIAGNOSIS — D689 Coagulation defect, unspecified: Secondary | ICD-10-CM | POA: Insufficient documentation

## 2020-09-25 DIAGNOSIS — B351 Tinea unguium: Secondary | ICD-10-CM | POA: Diagnosis not present

## 2020-09-25 DIAGNOSIS — M79675 Pain in left toe(s): Secondary | ICD-10-CM | POA: Diagnosis not present

## 2020-09-25 DIAGNOSIS — M79674 Pain in right toe(s): Secondary | ICD-10-CM

## 2020-09-25 DIAGNOSIS — N183 Chronic kidney disease, stage 3 unspecified: Secondary | ICD-10-CM

## 2020-09-25 NOTE — Progress Notes (Signed)
This patient returns to my office for at risk foot care.  This patient requires this care by a professional since this patient will be at risk due to having chronic kidney disease and coagulation defect.  Patient is taking eliquis.  This patient is unable to cut nails himself since the patient cannot reach his nails.These nails are painful walking and wearing shoes.  This patient presents for at risk foot care today.  General Appearance  Alert, conversant and in no acute stress.  Vascular  Dorsalis pedis and posterior tibial  pulses are palpable  bilaterally.  Capillary return is within normal limits  bilaterally. Temperature is within normal limits  bilaterally.  Neurologic  Senn-Weinstein monofilament wire test within normal limits  bilaterally. Muscle power within normal limits bilaterally.  Nails Thick disfigured discolored nails with subungual debris  from hallux to fifth toes bilaterally. No evidence of bacterial infection or drainage bilaterally.  Orthopedic  No limitations of motion  feet .  No crepitus or effusions noted.  No bony pathology or digital deformities noted.  Hammer toes 2-4  B/L.  Skin  normotropic skin with no porokeratosis noted bilaterally.  No signs of infections or ulcers noted.     Onychomycosis  Pain in right toes  Pain in left toes  Consent was obtained for treatment procedures.   Mechanical debridement of nails 1-5  bilaterally performed with a nail nipper.  Filed with dremel without incident.    Return office visit    prn                 Told patient to return for periodic foot care and evaluation due to potential at risk complications.   Gardiner Barefoot DPM

## 2020-10-03 ENCOUNTER — Ambulatory Visit: Payer: Medicare Other | Attending: Family Medicine | Admitting: Physical Therapy

## 2020-10-03 ENCOUNTER — Other Ambulatory Visit: Payer: Self-pay

## 2020-10-03 ENCOUNTER — Encounter: Payer: Self-pay | Admitting: Physical Therapy

## 2020-10-03 DIAGNOSIS — R2681 Unsteadiness on feet: Secondary | ICD-10-CM

## 2020-10-03 DIAGNOSIS — M6281 Muscle weakness (generalized): Secondary | ICD-10-CM | POA: Diagnosis not present

## 2020-10-03 NOTE — Therapy (Addendum)
[place] MAIN [facility] SERVICES [address] [place], [place], [address] Phone: [phone]   Fax:  [phone]  Physical Therapy Evaluation  Patient Details  Name: Michael Gilbert MRN: [id number] Date of Birth: 1928/03/06 Referring Provider (PT): Dr. Baldemar Lenis   Encounter Date: 10/03/2020   PT End of Session - 10/03/20 0945    Visit Number 1    Number of Visits 17    Date for PT Re-Evaluation 11/28/20    PT Start Time 0901    PT Stop Time 0955    PT Time Calculation (min) 54 min    Equipment Utilized During Treatment Gait belt    Activity Tolerance Patient tolerated treatment well    Behavior During Therapy [facility] for tasks assessed/performed           Past Medical History:  Diagnosis Date  . Allergy   . Benign essential hypertension 06/23/2014  . Bradycardia   . Colorectal polyps   . Diverticulosis   . GERD (gastroesophageal reflux disease)   . Hypertension     Past Surgical History:  Procedure Laterality Date  . CATARACT EXTRACTION    . COLONOSCOPY WITH PROPOFOL N/A 12/06/2017   Procedure: COLONOSCOPY WITH PROPOFOL;  Surgeon: Lin Landsman, MD;  Location: [facility] ENDOSCOPY;  Service: Gastroenterology;  Laterality: N/A;  . ESOPHAGOGASTRODUODENOSCOPY (EGD) WITH PROPOFOL N/A 01/01/2018   Procedure: ESOPHAGOGASTRODUODENOSCOPY (EGD) WITH PROPOFOL;  Surgeon: Lin Landsman, MD;  Location: [facility] ENDOSCOPY;  Service: Gastroenterology;  Laterality: N/A;  . PACEMAKER LEADLESS INSERTION N/A 09/07/2020   Procedure: PACEMAKER LEADLESS INSERTION;  Surgeon: Isaias Cowman, MD;  Location: [place] CV LAB;  Service: Cardiovascular;  Laterality: N/A;  . TONSILECTOMY, ADENOIDECTOMY, BILATERAL MYRINGOTOMY AND TUBES      There were no vitals filed for this visit.    Subjective Assessment - 10/03/20 0859    Subjective "I had a pacemaker put in a month ago Thursday and I am just trying to get my strength and mobility back."    Pertinent  History 86 yo Male reports falling and hitting head on 09/05/20 resulting in hospital admission.  Pt admitted for tx for syncope & collapse suspected to slow ventricular rate of a-flutter. Pt underwent micra pacemaker placement on 09/07/20. He reports so far everything has gone well since the pacemaker placement. He reports he used to walk about a mile a day (about 4-6 months ago) and then that decreased to 1/2 a mile a day and since the pacemaker hasn't been walking much. He reports he was walking with no assistive device. He has a cane which he uses sometimes especially when walking outside; He denies any other recent falls. He reports all injuries resultant from fall are healing well.    How long can you sit comfortably? NA    How long can you stand comfortably? 30 min, will reach out and hold onto things    How long can you walk comfortably? limited to short distances    Diagnostic tests none recent    Patient Stated Goals Improve strength and increase mobility; get back to walking;    Currently in Pain? No/denies    Multiple Pain Sites No              [facility] PT Assessment - 10/03/20 0001      Assessment   Medical Diagnosis syncope/weakness    Referring Provider (PT) Dr. Baldemar Lenis    Onset Date/Surgical Date 09/05/20    Hand Dominance Right    Next  MD Visit --   First of December for annual physical    Prior Therapy had PT in Sept/Oct for imbalance with good results      Precautions   Precautions ICD/Pacemaker;Fall      Restrictions   Weight Bearing Restrictions No      Balance Screen   Has the patient fallen in the past 6 months Yes    How many times? 1    Has the patient had a decrease in activity level because of a fear of falling?  No    Is the patient reluctant to leave their home because of a fear of falling?  No      Home Environment   Additional Comments Living with spouse in single story home, has 2 steps to enter with B rails, able to negotiate reciprocally; independent  with all ADLs; still driving      Prior Function   Level of Independence Independent    Vocation Retired    Leisure plays golf, likes to walk, likes to read; be outside;       [employer]   Overall Cognitive Status Within Functional Limits for tasks assessed      Observation/Other Assessments   Observations very pleasant gentleman;     Focus on Therapeutic Outcomes (FOTO)  69%      Sensation   Light Touch Appears Intact    Proprioception Appears Intact      Coordination   Gross Motor Movements are Fluid and Coordinated Yes    Fine Motor Movements are Fluid and Coordinated Yes    Heel Shin Test accurate bilaterally;       Posture/Postural Control   Posture Comments Patient exhibits a good erect posture, WFL      ROM / Strength   AROM / PROM / Strength AROM;Strength      AROM   Overall AROM Comments BUE and BLE are WFL, does have slight pain with RUE but functional ROM      Strength   Strength Assessment Site Hip;Knee;Ankle    Right/Left Hip Right;Left    Right Hip Flexion 4-/5    Left Hip Flexion 4+/5    Right/Left Knee Right;Left    Right Knee Flexion 5/5    Right Knee Extension 5/5    Left Knee Flexion 5/5    Left Knee Extension 5/5    Right/Left Ankle Right;Left    Right Ankle Dorsiflexion 5/5    Left Ankle Dorsiflexion 5/5      Transfers   Comments able to transfer sit<>Stand without pushing on arm rests with good control;       Ambulation/Gait   Gait Comments ambulates with normal base of support, increased foot slap, slight quick cadence on RLE with short stance time on RLE likely due to weakness in RLE; no sway noted; able to ambulate without AD close supervision      6 Minute Walk- Baseline   6 Minute Walk- Baseline yes    BP (mmHg) 167/75    HR (bpm) 60    02 Sat (%RA) 97 %      6 Minute walk- Post Test   6 Minute Walk Post Test yes    BP (mmHg) 189/71    HR (bpm) 61    02 Sat (%RA) 97 %    Modified Borg Scale for Dyspnea 3- Moderate shortness of  breath or breathing difficulty    Perceived Rate of Exertion (Borg) 13- Somewhat hard      6 minute walk  test results    Aerobic Endurance Distance Walked 1050    Endurance additional comments community ambulator      Standardized Balance Assessment   Standardized Balance Assessment Five Times Sit to Stand;10 meter walk test    Five times sit to stand comments  13 sec without HHA, low fall risk    10 Meter Walk        High Level Balance   High Level Balance Comments SLS- 1 sec each LE, unable to hold tandem stance; feet together eyes closed, good stability, no imbalance, able to walk backwards supervision no sway,                       Objective measurements completed on examination: See above findings.   Following session, BP assessed manually, 155/85 with regular size cuff on LUE;   Will address HEP next session. Patient will need vital sign monitoring with exercise for safety;             PT Education - 10/03/20 0945    Education Details plan of care/recommendations    Person(s) Educated Patient    Methods Explanation;Verbal cues    Comprehension Verbalized understanding;Returned demonstration;Verbal cues required;Need further instruction            PT Short Term Goals - 10/03/20 1124      PT SHORT TERM GOAL #1   Title Patient will be adherent to HEP at least 3x a week to improve functional strength and balance for better safety at home.    Time 4    Period Weeks    Status New    Target Date 10/31/20      PT SHORT TERM GOAL #2   Title Patient will increase BLE gross strength to 4+/5 as to improve functional strength for independent gait, increased standing tolerance and increased ADL ability.    Time 4    Period Weeks    Status New    Target Date 10/31/20             PT Long Term Goals - 10/03/20 1126      PT LONG TERM GOAL #1   Title Patient will increase six minute walk test distance to >1100 feet with minimal shortness of breath for  progression to community ambulator and improve gait ability    Time 8    Period Weeks    Status New    Target Date 11/28/20      PT LONG TERM GOAL #2   Title Patient will improve FOTO score to >76% for increased mobility at home and in the community.    Time 8    Period Weeks    Status New    Target Date 11/28/20      PT LONG TERM GOAL #3   Title Patient will return to walking 1/2 mile at least 2-3 times a week with minimal difficulty to return to PLOF.    Time 8    Period Weeks    Status New    Target Date 11/28/20      PT LONG TERM GOAL #4   Title Patient will tolerate 5 seconds of single leg stance without loss of balance to improve ability to get in and out of shower safely.    Time 8    Period Weeks    Status New    Target Date 11/28/20  Plan - 10/03/20 0946    Clinical Impression Statement 86 yo Male was active prior to disease onset, reports suffering a fall after syncope on 09/05/20. Patient is s/p pacemaker placement on 09/07/20. He reports since procedure he hasn't gotten his strength or endurance back to baseline. Prior to incident patient was walking 1/2-1 mile daily (approximatley 4 months ago). He reports since his pacemaker surgery he has not been able to walk as much as before. He is using a SPC intermittently (mostly when going outside) to help with steadiness. Patient does exhibit some weakness in BLE partiuclarly in RLE hip. Patient ambulates without AD with normal base of support, short/quick cadence on RLE with decreased stance on RLE which could be due to weakness in RLE. Patient does exhibit elevated BP especially with activity as evidenced with 6 min walk test. Patient does report shortness of breath with 6 min walk test. He also had difficulty with high level tasks such as SLS and tandem stance. Patient would benefit from additional skilled PT intervention to improve strength, balance and mobility; Will need vital monitoring for safety;     Personal Factors and Comorbidities Comorbidity 3+    Comorbidities PMH: HTN, a-fib a flutter with bradycardia anticoagulated on eliquis,s/p pacemaker on 09/07/20,  COPD, CKD stage 3, OSA    Examination-Activity Limitations Locomotion Level;Stand    Examination-Participation Restrictions Cleaning;Community Activity;Shop;Volunteer;Yard Work    Merchant [employer] officer Stable/Uncomplicated    Designer, jewellery Low    Rehab Potential Good    PT Frequency 2x / week    PT Duration 8 weeks    PT Treatment/Interventions Cryotherapy;Moist Heat;Gait training;Stair training;Functional mobility training;Therapeutic activities;Therapeutic exercise;Balance training;Neuromuscular re-education;Patient/family education;Energy conservation    PT Next Visit Plan initiate HEP- LE strengthening, monitor vitals    PT Home Exercise Plan will address next session    Consulted and Agree with Plan of Care Patient           Patient will benefit from skilled therapeutic intervention in order to improve the following deficits and impairments:  Decreased mobility, Cardiopulmonary status limiting activity, Decreased activity tolerance, Decreased endurance, Decreased strength, Difficulty walking, Decreased balance  Visit Diagnosis: Muscle weakness (generalized)  Unsteadiness on feet     Problem List Patient Active Problem List   Diagnosis Date Noted  . Pain due to onychomycosis of toenails of both feet 09/25/2020  . Coagulation defect ([place]) 09/25/2020  . Slow ventricular response 09/06/2020  . Sick sinus syndrome ([place]) 09/06/2020  . Syncope and collapse 09/05/2020  . CKD (chronic kidney disease) stage 3, GFR 30-59 ml/min (HCC) 09/05/2020  . Hypertension   . Diverticulosis   . History of urinary retention 06/24/2020  . Atrial flutter ([place]) 12/10/2019  . Benign prostatic hyperplasia with urinary frequency 04/27/2019  . Helicobacter pylori antibody positive   . Hemorrhage of rectum and anus     . Diverticulosis of colon (without mention of hemorrhage)   . Acute blood loss anemia   . GI bleed 12/05/2017  . Bradycardia 08/30/2016  . PVC (premature ventricular contraction) 08/30/2016  . HTN (hypertension), benign 06/23/2014  . GERD (gastroesophageal reflux disease) 06/23/2014  . OSA (obstructive sleep apnea) 06/23/2014  . Benign essential hypertension 06/23/2014    Mccrae Speciale PT, DPT 10/03/2020, 11:28 AM  [place] MAIN [facility] SERVICES [address] [place], [place], [address] Phone: [phone]   Fax:  [phone]  Name: JOHNWILLIAM SHEPPERSON MRN: [id number] Date of Birth: 09-23-1928

## 2020-10-10 ENCOUNTER — Ambulatory Visit: Payer: Medicare Other

## 2020-10-12 ENCOUNTER — Other Ambulatory Visit: Payer: Self-pay

## 2020-10-12 ENCOUNTER — Ambulatory Visit: Payer: Medicare Other | Attending: Family Medicine

## 2020-10-12 DIAGNOSIS — R2681 Unsteadiness on feet: Secondary | ICD-10-CM | POA: Insufficient documentation

## 2020-10-12 DIAGNOSIS — M6281 Muscle weakness (generalized): Secondary | ICD-10-CM | POA: Diagnosis present

## 2020-10-12 NOTE — Therapy (Signed)
[place] MAIN [facility] SERVICES [address] [place], [place], [address] Phone: [phone]   Fax:  [phone]  Physical Therapy Treatment  Patient Details  Name: Michael Gilbert MRN: [id number] Date of Birth: 05-19-1928 Referring Provider (PT): Dr. Baldemar Lenis   Encounter Date: 10/12/2020   PT End of Session - 10/12/20 0918    Visit Number 2    Number of Visits 17    Date for PT Re-Evaluation 11/28/20    PT Start Time 0930    Equipment Utilized During Treatment Gait belt    Activity Tolerance Patient tolerated treatment well    Behavior During Therapy [facility] for tasks assessed/performed           Past Medical History:  Diagnosis Date  . Allergy   . Benign essential hypertension 06/23/2014  . Bradycardia   . Colorectal polyps   . Diverticulosis   . GERD (gastroesophageal reflux disease)   . Hypertension     Past Surgical History:  Procedure Laterality Date  . CATARACT EXTRACTION    . COLONOSCOPY WITH PROPOFOL N/A 12/06/2017   Procedure: COLONOSCOPY WITH PROPOFOL;  Surgeon: Lin Landsman, MD;  Location: [facility] ENDOSCOPY;  Service: Gastroenterology;  Laterality: N/A;  . ESOPHAGOGASTRODUODENOSCOPY (EGD) WITH PROPOFOL N/A 01/01/2018   Procedure: ESOPHAGOGASTRODUODENOSCOPY (EGD) WITH PROPOFOL;  Surgeon: Lin Landsman, MD;  Location: [facility] ENDOSCOPY;  Service: Gastroenterology;  Laterality: N/A;  . PACEMAKER LEADLESS INSERTION N/A 09/07/2020   Procedure: PACEMAKER LEADLESS INSERTION;  Surgeon: Isaias Cowman, MD;  Location: [place] CV LAB;  Service: Cardiovascular;  Laterality: N/A;  . TONSILECTOMY, ADENOIDECTOMY, BILATERAL MYRINGOTOMY AND TUBES      There were no vitals filed for this visit.   Subjective Assessment - 10/12/20 0918    Subjective The patient reports he is doing well overall and feeling good.  Patient states BP at home prior to leaving for his appt was 133/72.    Pertinent History 86 yo Male reports falling and  hitting head on 09/05/20 resulting in hospital admission.  Pt admitted for tx for syncope & collapse suspected to slow ventricular rate of a-flutter. Pt underwent micra pacemaker placement on 09/07/20. He reports so far everything has gone well since the pacemaker placement. He reports he used to walk about a mile a day (about 4-6 months ago) and then that decreased to 1/2 a mile a day and since the pacemaker hasn't been walking much. He reports he was walking with no assistive device. He has a cane which he uses sometimes especially when walking outside; He denies any other recent falls. He reports all injuries resultant from fall are healing well.    How long can you sit comfortably? NA    How long can you stand comfortably? 30 min, will reach out and hold onto things    How long can you walk comfortably? limited to short distances    Diagnostic tests none recent    Patient Stated Goals Improve strength and increase mobility; get back to walking;    Currently in Pain? No/denies                 Vitals  pre-tx: O2Sa 100, HR 58 BP 169/93   Seated: Hip adduction 5"x10 Hip abduction 5"x10 with Green TB Hip flexion  3# x20 each LAQ 3# x20 each  CGA for safety: Sit to stand x10 Semi tandem balance  Stand on Aire[employer] WBOS EO/EC 30"x1 each Hurdle step over f/b and sidestepping x10 each  Post  tx  02Sa 100, HR 59 BP 145/66  Issued LAQ, seated marching and ball squeeze to HEP.                 PT Education - 10/12/20 0918    Education Details HEP, posture, exercise technique    Person(s) Educated Patient    Methods Explanation;Demonstration    Comprehension Verbalized understanding;Returned demonstration            PT Short Term Goals - 10/03/20 1124      PT SHORT TERM GOAL #1   Title Patient will be adherent to HEP at least 3x a week to improve functional strength and balance for better safety at home.    Time 4    Period Weeks    Status New    Target Date  10/31/20      PT SHORT TERM GOAL #2   Title Patient will increase BLE gross strength to 4+/5 as to improve functional strength for independent gait, increased standing tolerance and increased ADL ability.    Time 4    Period Weeks    Status New    Target Date 10/31/20             PT Long Term Goals - 10/03/20 1126      PT LONG TERM GOAL #1   Title Patient will increase six minute walk test distance to >1100 feet with minimal shortness of breath for progression to community ambulator and improve gait ability    Time 8    Period Weeks    Status New    Target Date 11/28/20      PT LONG TERM GOAL #2   Title Patient will improve FOTO score to >76% for increased mobility at home and in the community.    Time 8    Period Weeks    Status New    Target Date 11/28/20      PT LONG TERM GOAL #3   Title Patient will return to walking 1/2 mile at least 2-3 times a week with minimal difficulty to return to PLOF.    Time 8    Period Weeks    Status New    Target Date 11/28/20      PT LONG TERM GOAL #4   Title Patient will tolerate 5 seconds of single leg stance without loss of balance to improve ability to get in and out of shower safely.    Time 8    Period Weeks    Status New    Target Date 11/28/20                 Plan - 10/12/20 0919    Clinical Impression Statement Initiated strengthening and balance exercises.  Issued HEP which patient voiced understanding of.  Patient's vitals were monitored during session.  The patient required 1 rest break after standing activities due to reports of needing to catch his breath.  The patient continues to benefit from additional skilled PT intervention to improve LE strength, balance and endurance for decreased risk of falls and improved quality of life;    Personal Factors and Comorbidities Comorbidity 3+    Comorbidities PMH: HTN, a-fib a flutter with bradycardia anticoagulated on eliquis,s/p pacemaker on 09/07/20,  COPD, CKD stage 3,  OSA    Examination-Activity Limitations Locomotion Level;Stand    Examination-Participation Restrictions Cleaning;Community Activity;Shop;Volunteer;Yard Work    Stability/Clinical Decision Making Stable/Uncomplicated    Rehab Potential Good    PT Frequency 2x / week  PT Duration 8 weeks    PT Treatment/Interventions Cryotherapy;Moist Heat;Gait training;Stair training;Functional mobility training;Therapeutic activities;Therapeutic exercise;Balance training;Neuromuscular re-education;Patient/family education;Energy conservation    PT Next Visit Plan initiate HEP- LE strengthening, monitor vitals    PT Home Exercise Plan will address next session    Consulted and Agree with Plan of Care Patient           Patient will benefit from skilled therapeutic intervention in order to improve the following deficits and impairments:  Decreased mobility,Cardiopulmonary status limiting activity,Decreased activity tolerance,Decreased endurance,Decreased strength,Difficulty walking,Decreased balance  Visit Diagnosis: Muscle weakness (generalized)  Unsteadiness on feet     Problem List Patient Active Problem List   Diagnosis Date Noted  . Pain due to onychomycosis of toenails of both feet 09/25/2020  . Coagulation defect ([place]) 09/25/2020  . Slow ventricular response 09/06/2020  . Sick sinus syndrome ([place]) 09/06/2020  . Syncope and collapse 09/05/2020  . CKD (chronic kidney disease) stage 3, GFR 30-59 ml/min (HCC) 09/05/2020  . Hypertension   . Diverticulosis   . History of urinary retention 06/24/2020  . Atrial flutter ([place]) 12/10/2019  . Benign prostatic hyperplasia with urinary frequency 04/27/2019  . Helicobacter pylori antibody positive   . Hemorrhage of rectum and anus   . Diverticulosis of colon (without mention of hemorrhage)   . Acute blood loss anemia   . GI bleed 12/05/2017  . Bradycardia 08/30/2016  . PVC (premature ventricular contraction) 08/30/2016  . HTN (hypertension),  benign 06/23/2014  . GERD (gastroesophageal reflux disease) 06/23/2014  . OSA (obstructive sleep apnea) 06/23/2014  . Benign essential hypertension 06/23/2014    Hal Morales PT, DPT 10/12/2020, 10:20 AM  [place] MAIN [facility] SERVICES [address] [place], [place], [address] Phone: [phone]   Fax:  [phone]  Name: Michael Gilbert MRN: [id number] Date of Birth: 23-Dec-1927

## 2020-10-17 ENCOUNTER — Ambulatory Visit: Payer: Medicare Other

## 2020-10-17 ENCOUNTER — Other Ambulatory Visit: Payer: Self-pay

## 2020-10-17 DIAGNOSIS — M6281 Muscle weakness (generalized): Secondary | ICD-10-CM

## 2020-10-17 DIAGNOSIS — R2681 Unsteadiness on feet: Secondary | ICD-10-CM

## 2020-10-17 NOTE — Therapy (Signed)
[place] [facility] [address] [place], [place], [address] Phone: [phone]   Fax:  [phone]  Physical Therapy Treatment  Patient Details  Name: Michael Gilbert MRN: [id number] Date of Birth: 1928-07-08 Referring Provider (PT): Dr. Baldemar Lenis   Encounter Date: 10/17/2020   PT End of Session - 10/17/20 0932    Visit Number 3    Number of Visits 17    Date for PT Re-Evaluation 11/28/20    PT Start Time 0918    PT Stop Time 1004    PT Time Calculation (min) 46 min    Equipment Utilized During Treatment Gait belt    Activity Tolerance Patient tolerated treatment well    Behavior During Therapy [facility] for tasks assessed/performed           Past Medical History:  Diagnosis Date   Allergy    Benign essential hypertension 06/23/2014   Bradycardia    Colorectal polyps    Diverticulosis    GERD (gastroesophageal reflux disease)    Hypertension     Past Surgical History:  Procedure Laterality Date   CATARACT EXTRACTION     COLONOSCOPY WITH PROPOFOL N/A 12/06/2017   Procedure: COLONOSCOPY WITH PROPOFOL;  Surgeon: Lin Landsman, MD;  Location: [facility];  Service: Gastroenterology;  Laterality: N/A;   ESOPHAGOGASTRODUODENOSCOPY (EGD) WITH PROPOFOL N/A 01/01/2018   Procedure: ESOPHAGOGASTRODUODENOSCOPY (EGD) WITH PROPOFOL;  Surgeon: Lin Landsman, MD;  Location: [facility] ENDOSCOPY;  Service: Gastroenterology;  Laterality: N/A;   PACEMAKER LEADLESS INSERTION N/A 09/07/2020   Procedure: PACEMAKER LEADLESS INSERTION;  Surgeon: Isaias Cowman, MD;  Location: [place] CV LAB;  Service: Cardiovascular;  Laterality: N/A;   TONSILECTOMY, ADENOIDECTOMY, BILATERAL MYRINGOTOMY AND TUBES       Subjective Assessment - 10/17/20 0930    Subjective The patient reports he is still doing well and is without complaints and the patient reports his BP testing at home has been good.    Pertinent History 86 yo Male  reports falling and hitting head on 09/05/20 resulting in hospital admission.  Pt admitted for tx for syncope & collapse suspected to slow ventricular rate of a-flutter. Pt underwent micra pacemaker placement on 09/07/20. He reports so far everything has gone well since the pacemaker placement. He reports he used to walk about a mile a day (about 4-6 months ago) and then that decreased to 1/2 a mile a day and since the pacemaker hasn't been walking much. He reports he was walking with no assistive device. He has a cane which he uses sometimes especially when walking outside; He denies any other recent falls. He reports all injuries resultant from fall are healing well.    How long can you sit comfortably? NA    How long can you stand comfortably? 30 min, will reach out and hold onto things    How long can you walk comfortably? limited to short distances    Diagnostic tests none recent    Patient Stated Goals Improve strength and increase mobility; get back to walking;    Currently in Pain? No/denies             Vitals SpO2 98, HR 59 BP 173/70- pt reports much lower recording at home  Nu-step L1 5 mins  Seated: Hip adduction 5x15 Hip abduction 5x15 with Green TB Hip flexion  3# x20 each LAQ 3# x20 each  Sit to stand x10  Standing: Hip abduction/extension x10 each    CGA for safety: Semi  tandem balance 30"x2 each Airex WBOS EO/EC 30"x1 each Hurdle step over f/b and sidestepping x10 each Airex beam Tandem walk fwd/bwd x3 laps                           PT Education - 10/17/20 0931    Education Details posture, HEP handout    Person(s) Educated Patient    Methods Explanation    Comprehension Verbalized understanding            PT Short Term Goals - 10/03/20 1124      PT SHORT TERM GOAL #1   Title Patient will be adherent to HEP at least 3x a week to improve functional strength and balance for better safety at home.    Time 4    Period Weeks     Status New    Target Date 10/31/20      PT SHORT TERM GOAL #2   Title Patient will increase BLE gross strength to 4+/5 as to improve functional strength for independent gait, increased standing tolerance and increased ADL ability.    Time 4    Period Weeks    Status New    Target Date 10/31/20             PT Long Term Goals - 10/03/20 1126      PT LONG TERM GOAL #1   Title Patient will increase six minute walk test distance to >1100 feet with minimal shortness of breath for progression to community ambulator and improve gait ability    Time 8    Period Weeks    Status New    Target Date 11/28/20      PT LONG TERM GOAL #2   Title Patient will improve FOTO score to >76% for increased mobility at home and in the community.    Time 8    Period Weeks    Status New    Target Date 11/28/20      PT LONG TERM GOAL #3   Title Patient will return to walking 1/2 mile at least 2-3 times a week with minimal difficulty to return to PLOF.    Time 8    Period Weeks    Status New    Target Date 11/28/20      PT LONG TERM GOAL #4   Title Patient will tolerate 5 seconds of single leg stance without loss of balance to improve ability to get in and out of shower safely.    Time 8    Period Weeks    Status New    Target Date 11/28/20                 Plan - 10/17/20 0932    Clinical Impression Statement The patient was challenged with balance activities, CGA required.  The patient with mod trunk sway when unsteady on uneven surfaces.  The patient continues to benefit from additional skilled PT intervention to improve endurance, strength and stability for improved tolerance to ADLs and to reduce fall risk..    Personal Factors and Comorbidities Comorbidity 3+    Comorbidities PMH: HTN, a-fib a flutter with bradycardia anticoagulated on eliquis,s/p pacemaker on 09/07/20,  COPD, CKD stage 3, OSA    Examination-Activity Limitations Locomotion Level;Stand    Examination-Participation  Restrictions Cleaning;Community Activity;Shop;Volunteer;Yard Work    Stability/Clinical Decision Making Stable/Uncomplicated    Rehab Potential Good    PT Frequency 2x / week    PT Duration  8 weeks    PT Treatment/Interventions Cryotherapy;Moist Heat;Gait training;Stair training;Functional mobility training;Therapeutic activities;Therapeutic exercise;Balance training;Neuromuscular re-education;Patient/family education;Energy conservation    PT Next Visit Plan initiate HEP- LE strengthening, monitor vitals    PT Home Exercise Plan will address next session    Consulted and Agree with Plan of Care Patient           Patient will benefit from skilled therapeutic intervention in order to improve the following deficits and impairments:  Decreased mobility,Cardiopulmonary status limiting activity,Decreased activity tolerance,Decreased endurance,Decreased strength,Difficulty walking,Decreased balance  Visit Diagnosis: Muscle weakness (generalized)  Unsteadiness on feet     Problem List Patient Active Problem List   Diagnosis Date Noted   Pain due to onychomycosis of toenails of both feet 09/25/2020   Coagulation defect ([place]) 09/25/2020   Slow ventricular response 09/06/2020   Sick sinus syndrome ([place]) 09/06/2020   Syncope and collapse 09/05/2020   CKD (chronic kidney disease) stage 3, GFR 30-59 ml/min ([place]) 09/05/2020   Hypertension    Diverticulosis    History of urinary retention 06/24/2020   Atrial flutter ([place]) 12/10/2019   Benign prostatic hyperplasia with urinary frequency 96/43/8381   Helicobacter pylori antibody positive    Hemorrhage of rectum and anus    Diverticulosis of colon (without mention of hemorrhage)    Acute blood loss anemia    GI bleed 12/05/2017   Bradycardia 08/30/2016   PVC (premature ventricular contraction) 08/30/2016   HTN (hypertension), benign 06/23/2014   GERD (gastroesophageal reflux disease) 06/23/2014   OSA (obstructive  sleep apnea) 06/23/2014   Benign essential hypertension 06/23/2014    Hal Morales PT, DPT 10/17/2020, 10:12 AM  [facility] [address] [place], [place], [address] Phone: [phone]   Fax:  [phone]  Name: Michael Gilbert MRN: [id number] Date of Birth: [birth year]

## 2020-10-20 ENCOUNTER — Ambulatory Visit: Payer: Medicare Other

## 2020-10-24 ENCOUNTER — Ambulatory Visit: Payer: Medicare Other

## 2020-10-30 ENCOUNTER — Encounter: Payer: Self-pay | Admitting: Physical Therapy

## 2020-10-30 NOTE — Therapy (Signed)
[place] [facility] [address] [place], [place], [address] Phone: [phone]   Fax:  [phone]  October 30, 2020   No Recipients  Physical Therapy Discharge Summary  Patient: Michael Gilbert  MRN: [id number]  Date of Birth: 09/28/1928   Diagnosis: No diagnosis found. Referring Provider (PT): Dr. Baldemar Lenis   The above patient had been seen in Physical Therapy 3 times of 4 treatments scheduled with 0 no shows and 1 cancellations. He called on 10/30/20 and requested to stop therapy at this time due to increasing rise of COVID cases. He was instructed he would need a new order for PT to resume therapy when he was ready to start back. Patient verbalized understanding. He will be discharged at this time.   The treatment consisted of balance/strengthening The patient is: Unchanged  No Goals Met    Sincerely,   Hafsah Hendler, PT, DPT   CC No Recipients  [place] [facility] [address] [place], [place], [address] Phone: [phone]   Fax:  [phone]  Patient: Michael Gilbert  MRN: [id number]  Date of Birth: May 05, 1928

## 2020-10-31 ENCOUNTER — Ambulatory Visit: Payer: Medicare Other

## 2020-11-02 ENCOUNTER — Ambulatory Visit: Payer: Medicare Other

## 2020-11-07 ENCOUNTER — Ambulatory Visit: Payer: Medicare Other | Admitting: Physical Therapy

## 2020-11-09 ENCOUNTER — Ambulatory Visit: Payer: Medicare Other

## 2020-11-14 ENCOUNTER — Ambulatory Visit: Payer: Medicare Other

## 2020-11-16 ENCOUNTER — Other Ambulatory Visit: Payer: Self-pay | Admitting: Urology

## 2020-11-16 ENCOUNTER — Ambulatory Visit: Payer: Medicare Other

## 2020-11-21 ENCOUNTER — Ambulatory Visit: Payer: Medicare Other

## 2020-11-23 ENCOUNTER — Ambulatory Visit: Payer: Medicare Other

## 2020-11-28 ENCOUNTER — Ambulatory Visit: Payer: Medicare Other

## 2020-11-30 ENCOUNTER — Ambulatory Visit: Payer: Medicare Other

## 2021-01-25 ENCOUNTER — Ambulatory Visit: Payer: Medicare Other | Admitting: Podiatry

## 2021-02-01 ENCOUNTER — Other Ambulatory Visit: Payer: Self-pay

## 2021-02-01 ENCOUNTER — Encounter: Payer: Self-pay | Admitting: Podiatry

## 2021-02-01 ENCOUNTER — Encounter: Payer: Self-pay | Admitting: *Deleted

## 2021-02-01 ENCOUNTER — Ambulatory Visit (INDEPENDENT_AMBULATORY_CARE_PROVIDER_SITE_OTHER): Payer: Medicare Other | Admitting: Podiatry

## 2021-02-01 DIAGNOSIS — M79674 Pain in right toe(s): Secondary | ICD-10-CM

## 2021-02-01 DIAGNOSIS — M79675 Pain in left toe(s): Secondary | ICD-10-CM | POA: Diagnosis not present

## 2021-02-01 DIAGNOSIS — D689 Coagulation defect, unspecified: Secondary | ICD-10-CM | POA: Diagnosis not present

## 2021-02-01 DIAGNOSIS — B351 Tinea unguium: Secondary | ICD-10-CM

## 2021-02-01 DIAGNOSIS — N183 Chronic kidney disease, stage 3 unspecified: Secondary | ICD-10-CM | POA: Diagnosis not present

## 2021-02-01 NOTE — Progress Notes (Signed)
This patient returns to my office for at risk foot care.  This patient requires this care by a professional since this patient will be at risk due to having chronic kidney disease and coagulation defect.  Patient is taking eliquis.  This patient is unable to cut nails himself since the patient cannot reach his nails.These nails are painful walking and wearing shoes.  This patient presents for at risk foot care today.  General Appearance  Alert, conversant and in no acute stress.  Vascular  Dorsalis pedis and posterior tibial  pulses are palpable  bilaterally.  Capillary return is within normal limits  bilaterally. Temperature is within normal limits  bilaterally.  Neurologic  Senn-Weinstein monofilament wire test within normal limits  bilaterally. Muscle power within normal limits bilaterally.  Nails Thick disfigured discolored nails with subungual debris  from hallux to fifth toes bilaterally. No evidence of bacterial infection or drainage bilaterally.  Orthopedic  No limitations of motion  feet .  No crepitus or effusions noted.  No bony pathology or digital deformities noted.  Hammer toes 2-4  B/L.  Skin  normotropic skin with no porokeratosis noted bilaterally.  No signs of infections or ulcers noted.     Onychomycosis  Pain in right toes  Pain in left toes  Consent was obtained for treatment procedures.   Mechanical debridement of nails 1-5  bilaterally performed with a nail nipper.  Filed with dremel without incident. To send info concerning balance brace.   Return office visit    4 months               Told patient to return for periodic foot care and evaluation due to potential at risk complications.   Gardiner Barefoot DPM

## 2021-04-12 ENCOUNTER — Other Ambulatory Visit: Payer: Self-pay | Admitting: Urology

## 2021-06-04 ENCOUNTER — Other Ambulatory Visit: Payer: Self-pay

## 2021-06-04 ENCOUNTER — Ambulatory Visit (INDEPENDENT_AMBULATORY_CARE_PROVIDER_SITE_OTHER): Payer: Medicare Other | Admitting: Podiatry

## 2021-06-04 ENCOUNTER — Encounter: Payer: Self-pay | Admitting: Podiatry

## 2021-06-04 DIAGNOSIS — M79674 Pain in right toe(s): Secondary | ICD-10-CM

## 2021-06-04 DIAGNOSIS — M79675 Pain in left toe(s): Secondary | ICD-10-CM

## 2021-06-04 DIAGNOSIS — N183 Chronic kidney disease, stage 3 unspecified: Secondary | ICD-10-CM | POA: Diagnosis not present

## 2021-06-04 DIAGNOSIS — D689 Coagulation defect, unspecified: Secondary | ICD-10-CM

## 2021-06-04 DIAGNOSIS — B351 Tinea unguium: Secondary | ICD-10-CM | POA: Diagnosis not present

## 2021-06-04 NOTE — Progress Notes (Signed)
This patient returns to my office for at risk foot care.  This patient requires this care by a professional since this patient will be at risk due to having chronic kidney disease and coagulation defect.  Patient is taking eliquis.  This patient is unable to cut nails himself since the patient cannot reach his nails.These nails are painful walking and wearing shoes.  This patient presents for at risk foot care today.  General Appearance  Alert, conversant and in no acute stress.  Vascular  Dorsalis pedis and posterior tibial  pulses are  weakly palpable  bilaterally.  Capillary return is within normal limits  bilaterally. Temperature is within normal limits  bilaterally.  Neurologic  Senn-Weinstein monofilament wire test within normal limits  bilaterally. Muscle power within normal limits bilaterally.  Nails Thick disfigured discolored nails with subungual debris  from hallux to fifth toes bilaterally. No evidence of bacterial infection or drainage bilaterally.  Orthopedic  No limitations of motion  feet .  No crepitus or effusions noted.  No bony pathology or digital deformities noted.  Hammer toes 2-4  B/L.  Skin  normotropic skin with no porokeratosis noted bilaterally.  No signs of infections or ulcers noted.     Onychomycosis  Pain in right toes  Pain in left toes  Consent was obtained for treatment procedures.   Mechanical debridement of nails 1-5  bilaterally performed with a nail nipper.  Filed with dremel without incident.    Return office visit    4 months               Told patient to return for periodic foot care and evaluation due to potential at risk complications.   Dorean Hiebert DPM  

## 2021-06-11 ENCOUNTER — Other Ambulatory Visit: Payer: Self-pay | Admitting: Urology

## 2021-06-15 ENCOUNTER — Other Ambulatory Visit: Payer: Self-pay

## 2021-06-15 ENCOUNTER — Ambulatory Visit (INDEPENDENT_AMBULATORY_CARE_PROVIDER_SITE_OTHER): Payer: Medicare Other | Admitting: Urology

## 2021-06-15 ENCOUNTER — Encounter: Payer: Self-pay | Admitting: Urology

## 2021-06-15 VITALS — BP 134/62 | HR 60 | Ht 72.0 in | Wt 176.6 lb

## 2021-06-15 DIAGNOSIS — Z87898 Personal history of other specified conditions: Secondary | ICD-10-CM | POA: Diagnosis not present

## 2021-06-15 DIAGNOSIS — N401 Enlarged prostate with lower urinary tract symptoms: Secondary | ICD-10-CM | POA: Diagnosis not present

## 2021-06-15 DIAGNOSIS — R350 Frequency of micturition: Secondary | ICD-10-CM

## 2021-06-15 LAB — BLADDER SCAN AMB NON-IMAGING

## 2021-06-15 MED ORDER — GEMTESA 75 MG PO TABS: 75.0000 mg | ORAL_TABLET | Freq: Every day | ORAL | 0 refills | Status: DC

## 2021-06-15 NOTE — Patient Instructions (Signed)
Vibegron Oral Tablets What is this medication? VIBEGRON (vye BEG ron) is used to treat overactive bladder. This medicine reduces the amount of bathroom visits. It may also help to control wettingaccidents. This medicine may be used for other purposes; ask your health care provider orpharmacist if you have questions. COMMON BRAND NAME(S): GEMTESA What should I tell my care team before I take this medication? They need to know if you have any of these conditions: kidney disease liver disease prostate disease trouble passing urine an unusual or allergic reaction to vibegron, other medicines, foods, dyes, or preservatives pregnant or trying to get pregnant breast-feeding How should I use this medication? Take this medicine by mouth with water. Take it as directed on the prescription label at the same time every day. Swallow the tablets whole. You can take it with or without food. If it upsets your stomach, take it with food. You may also crush the tablet and put the contents in 1 tablespoon (15 mL) of applesauce. Swallow the medicine and applesauce right away. Take your medicine at regular intervals. Do not take it more often than directed. Keep taking itunless your health care provider tells you to stop. Talk to your pediatrician regarding the use of this medicine in children.Special care may be needed. Overdosage: If you think you have taken too much of this medicine contact apoison control center or emergency room at once. NOTE: This medicine is only for you. Do not share this medicine with others. What if I miss a dose? If you miss a dose, take it as soon as you can. If it is almost time for yournext dose, take only that dose. Do not take double or extra doses. What may interact with this medication? digoxin This list may not describe all possible interactions. Give your health care provider a list of all the medicines, herbs, non-prescription drugs, or dietary supplements you use. Also tell  them if you smoke, drink alcohol, or use illegaldrugs. Some items may interact with your medicine. What should I watch for while using this medication? Visit your health care provider for regular checks on your progress. Tell your health care provider if your symptoms do not start to get better or if they getworse. You may need to limit your intake of tea, coffee, caffeinated sodas, oralcohol. These drinks may make your symptoms worse. What side effects may I notice from receiving this medication? Side effects that you should report to your doctor or health care professionalas soon as possible: allergic reactions like skin rash, itching or hives, swelling of the face, lips, or tongue infection (fever, chills, cough, sore throat, pain or trouble passing urine) trouble passing urine or change in the amount of urine Side effects that usually do not require medical attention (report these toyour doctor or health care professional if they continue or are bothersome): constipation diarrhea dry mouth headache nasal congestion (runny or stuffy nose) nausea sore throat This list may not describe all possible side effects. Call your doctor for medical advice about side effects. You may report side effects to FDA at1-800-FDA-1088. Where should I keep my medication? Keep out of the reach of children and pets. Store at room temperature between 20 and 25 degrees C (68 and 77 degrees F). NOTE: This sheet is a summary. It may not cover all possible information. If you have questions about this medicine, talk to your doctor, pharmacist, orhealth care provider.  2022 Elsevier/Gold Standard (2019-11-02 09:54:48)

## 2021-06-15 NOTE — Progress Notes (Signed)
06/15/2021 9:13 AM   Michael Gilbert [birth year] [id number]  Referring provider: Derinda Late, MD [phone] [name] [place],  [place] [address]  Chief Complaint  Patient presents with   Benign Prostatic Hypertrophy    Urologic history: 1.  BPH with LUTS Episode urinary retention February 2019 occurring after colonoscopy and subsequent hospitalization for lower GI bleed Primary symptoms daytime frequency with occasional urge incontinence Myrbetriq added to alpha-blocker with no improvement in frequency   HPI: 87 y.o. male presents for follow-up.  At last years visit he was given a trial of silodosin and did not feel this worked any better than tamsulosin; currently remains on tamsulosin twice daily without side effects Voiding pattern stable with frequency, urgency and variable nocturia at 1-3 Denies dysuria, gross hematuria No flank, abdominal or pelvic pain   PMH: Past Medical History:  Diagnosis Date   Allergy    Benign essential hypertension 06/23/2014   Bradycardia    Colorectal polyps    Diverticulosis    GERD (gastroesophageal reflux disease)    Hypertension     Surgical History: Past Surgical History:  Procedure Laterality Date   CATARACT EXTRACTION     COLONOSCOPY WITH PROPOFOL N/A 12/06/2017   Procedure: COLONOSCOPY WITH PROPOFOL;  Surgeon: Lin Landsman, MD;  Location: [facility] ENDOSCOPY;  Service: Gastroenterology;  Laterality: N/A;   ESOPHAGOGASTRODUODENOSCOPY (EGD) WITH PROPOFOL N/A 01/01/2018   Procedure: ESOPHAGOGASTRODUODENOSCOPY (EGD) WITH PROPOFOL;  Surgeon: Lin Landsman, MD;  [facility]COPY;  Service: Gastroenterology;  Laterality: N/A;   PACEMAKER LEADLESS INSERTION N/A 09/07/2020   Procedure: PACEMAKER LEADLESS INSERTION;  Surgeon: Isaias Cowman, MD;  Location: [facility] CV LAB;  Service: Cardiovascular;  Laterality: N/A;   TONSILECTOMY, ADENOIDECTOMY, BILATERAL  MYRINGOTOMY AND TUBES      Home Medications:  Allergies as of 06/15/2021       Reactions   Ace Inhibitors Cough   Lovastatin Other (See Comments)   Other reaction(s): Muscle Pain   Simvastatin    Other reaction(s): Muscle Pain        Medication List        Accurate as of June 15, 2021  9:13 AM. If you have any questions, ask your nurse or doctor.          STOP taking these medications    pantoprazole 40 MG tablet Commonly known as: Protonix Stopped by: Abbie Sons, MD       TAKE these medications    clobetasol cream 0.05 % Commonly known as: TEMOVATE Apply topically 2 (two) times daily.   Eliquis 2.5 MG Tabs tablet Generic drug: apixaban Take 1 tablet (2.5 mg total) by mouth 2 (two) times daily.   fluticasone 50 MCG/ACT nasal spray Commonly known as: FLONASE Place 2 sprays into both nostrils daily.   Gemtesa 75 MG Tabs Generic drug: Vibegron Take 75 mg by mouth daily at 2 PM. Started by: Abbie Sons, MD   hydrocortisone 2.5 % cream Apply topically 2 (two) times daily as needed.   losartan 25 MG tablet Commonly known as: COZAAR Take 25 mg by mouth daily.   Multi-Vitamins Tabs Take 1 tablet by mouth daily.   omeprazole 40 MG capsule Commonly known as: PRILOSEC Take 40 mg by mouth daily.   tamsulosin 0.4 MG Caps capsule Commonly known as: FLOMAX TAKE 2 CAPSULES EVERY DAY        Allergies:  Allergies  Allergen Reactions   Ace Inhibitors Cough  Lovastatin Other (See Comments)    Other reaction(s): Muscle Pain   Simvastatin     Other reaction(s): Muscle Pain    Family History: Family History  Problem Relation Age of Onset   Diabetes Father    Diabetes Brother    Bladder Cancer Neg Hx    Kidney cancer Neg Hx    Prostate cancer Neg Hx     Social History:  reports that he has quit smoking. He has never used smokeless tobacco. He reports current alcohol use of about 1.0 standard drink per week. He reports that he does not  use drugs.   Physical Exam: BP 134/62 (BP Location: Left Arm, Patient Position: Sitting, Cuff Size: Normal)   Pulse 60   Ht 6' (1.829 m)   Wt 176 lb 9.6 oz (80.1 kg)   BMI 23.95 kg/m   Constitutional:  Alert and oriented, No acute distress. HEENT: [place] AT, moist mucus membranes.  Trachea midline, no masses. Cardiovascular: No clubbing, cyanosis, or edema. Respiratory: Normal respiratory effort, no increased work of breathing.   Assessment & Plan:    1.  BPH with LUTS Bladder scan PVR 33 mL States frequency and urgency is a nuisance and was interested in a trial of Gemtesa Samples x4 weeks given and he will call back regarding efficacy Continue annual follow-up   Return in about 1 year (around 06/15/2022) for 2yrfollow up w/ PVR .   SAbbie Sons MBayou La Batre[address] SBolivarBTunica [place] [phone]

## 2021-06-16 ENCOUNTER — Encounter: Payer: Self-pay | Admitting: Urology

## 2021-06-22 ENCOUNTER — Ambulatory Visit: Payer: Self-pay | Admitting: Urology

## 2021-07-09 ENCOUNTER — Other Ambulatory Visit: Payer: Self-pay | Admitting: Urology

## 2021-07-10 ENCOUNTER — Other Ambulatory Visit: Payer: Self-pay | Admitting: Urology

## 2021-10-04 ENCOUNTER — Ambulatory Visit (INDEPENDENT_AMBULATORY_CARE_PROVIDER_SITE_OTHER): Payer: Medicare Other | Admitting: Podiatry

## 2021-10-04 ENCOUNTER — Other Ambulatory Visit: Payer: Self-pay

## 2021-10-04 ENCOUNTER — Encounter: Payer: Self-pay | Admitting: Podiatry

## 2021-10-04 DIAGNOSIS — D689 Coagulation defect, unspecified: Secondary | ICD-10-CM

## 2021-10-04 DIAGNOSIS — B351 Tinea unguium: Secondary | ICD-10-CM

## 2021-10-04 DIAGNOSIS — M79675 Pain in left toe(s): Secondary | ICD-10-CM

## 2021-10-04 DIAGNOSIS — N183 Chronic kidney disease, stage 3 unspecified: Secondary | ICD-10-CM | POA: Diagnosis not present

## 2021-10-04 DIAGNOSIS — M79674 Pain in right toe(s): Secondary | ICD-10-CM

## 2021-10-04 NOTE — Progress Notes (Signed)
This patient returns to my office for at risk foot care.  This patient requires this care by a professional since this patient will be at risk due to having chronic kidney disease and coagulation defect.  Patient is taking eliquis.  This patient is unable to cut nails himself since the patient cannot reach his nails.These nails are painful walking and wearing shoes.  This patient presents for at risk foot care today.  General Appearance  Alert, conversant and in no acute stress.  Vascular  Dorsalis pedis and posterior tibial  pulses are  weakly palpable  bilaterally.  Capillary return is within normal limits  bilaterally. Temperature is within normal limits  bilaterally.  Neurologic  Senn-Weinstein monofilament wire test within normal limits  bilaterally. Muscle power within normal limits bilaterally.  Nails Thick disfigured discolored nails with subungual debris  from hallux to fifth toes bilaterally. No evidence of bacterial infection or drainage bilaterally.  Orthopedic  No limitations of motion  feet .  No crepitus or effusions noted.  No bony pathology or digital deformities noted.  Hammer toes 2-4  B/L.  Skin  normotropic skin with no porokeratosis noted bilaterally.  No signs of infections or ulcers noted.     Onychomycosis  Pain in right toes  Pain in left toes  Consent was obtained for treatment procedures.   Mechanical debridement of nails 1-5  bilaterally performed with a nail nipper.  Filed with dremel without incident.    Return office visit    4 months               Told patient to return for periodic foot care and evaluation due to potential at risk complications.   Traver Meckes DPM  

## 2021-10-12 ENCOUNTER — Other Ambulatory Visit: Payer: Self-pay | Admitting: Urology

## 2022-01-11 ENCOUNTER — Other Ambulatory Visit: Payer: Self-pay | Admitting: Urology

## 2022-02-07 ENCOUNTER — Encounter: Payer: Self-pay | Admitting: Podiatry

## 2022-02-07 ENCOUNTER — Ambulatory Visit (INDEPENDENT_AMBULATORY_CARE_PROVIDER_SITE_OTHER): Payer: Medicare Other | Admitting: Podiatry

## 2022-02-07 DIAGNOSIS — B351 Tinea unguium: Secondary | ICD-10-CM

## 2022-02-07 DIAGNOSIS — M79675 Pain in left toe(s): Secondary | ICD-10-CM

## 2022-02-07 DIAGNOSIS — D689 Coagulation defect, unspecified: Secondary | ICD-10-CM | POA: Diagnosis not present

## 2022-02-07 DIAGNOSIS — N183 Chronic kidney disease, stage 3 unspecified: Secondary | ICD-10-CM | POA: Diagnosis not present

## 2022-02-07 DIAGNOSIS — M79674 Pain in right toe(s): Secondary | ICD-10-CM

## 2022-02-07 NOTE — Progress Notes (Signed)
This patient returns to my office for at risk foot care.  This patient requires this care by a professional since this patient will be at risk due to having chronic kidney disease and coagulation defect.  Patient is taking eliquis.  This patient is unable to cut nails himself since the patient cannot reach his nails.These nails are painful walking and wearing shoes.  This patient presents for at risk foot care today.  General Appearance  Alert, conversant and in no acute stress.  Vascular  Dorsalis pedis and posterior tibial  pulses are  weakly palpable  bilaterally.  Capillary return is within normal limits  bilaterally. Temperature is within normal limits  bilaterally.  Neurologic  Senn-Weinstein monofilament wire test within normal limits  bilaterally. Muscle power within normal limits bilaterally.  Nails Thick disfigured discolored nails with subungual debris  from hallux to fifth toes bilaterally. No evidence of bacterial infection or drainage bilaterally.  Orthopedic  No limitations of motion  feet .  No crepitus or effusions noted.  No bony pathology or digital deformities noted.  Hammer toes 2-4  B/L.  Skin  normotropic skin with no porokeratosis noted bilaterally.  No signs of infections or ulcers noted.     Onychomycosis  Pain in right toes  Pain in left toes  Consent was obtained for treatment procedures.   Mechanical debridement of nails 1-5  bilaterally performed with a nail nipper.  Filed with dremel without incident.    Return office visit    4 months               Told patient to return for periodic foot care and evaluation due to potential at risk complications.   Maxamillion Banas DPM  

## 2022-04-15 ENCOUNTER — Other Ambulatory Visit: Payer: Self-pay | Admitting: Urology

## 2022-06-07 ENCOUNTER — Other Ambulatory Visit: Payer: Self-pay | Admitting: Urology

## 2022-06-13 ENCOUNTER — Ambulatory Visit: Payer: Medicare Other | Admitting: Podiatry

## 2022-06-17 ENCOUNTER — Ambulatory Visit: Payer: Medicare Other | Admitting: Urology

## 2022-06-19 ENCOUNTER — Ambulatory Visit (INDEPENDENT_AMBULATORY_CARE_PROVIDER_SITE_OTHER): Payer: Medicare Other | Admitting: Urology

## 2022-06-19 ENCOUNTER — Encounter: Payer: Self-pay | Admitting: Urology

## 2022-06-19 VITALS — BP 148/76 | HR 102 | Ht 71.0 in | Wt 166.0 lb

## 2022-06-19 DIAGNOSIS — N401 Enlarged prostate with lower urinary tract symptoms: Secondary | ICD-10-CM

## 2022-06-19 DIAGNOSIS — R350 Frequency of micturition: Secondary | ICD-10-CM | POA: Diagnosis not present

## 2022-06-19 LAB — BLADDER SCAN AMB NON-IMAGING: SCA Result: 69

## 2022-06-19 NOTE — Progress Notes (Signed)
06/19/2022 9:01 AM   Michael Gilbert 09-Mar-1928 [id number]  Referring provider: Derinda Late, MD [phone] [facility] [place],  [place] [address]  Chief Complaint  Patient presents with   Benign Prostatic Hypertrophy    Urologic history: 1.  BPH with LUTS Episode urinary retention February 2019 occurring after colonoscopy and subsequent hospitalization for lower GI bleed   HPI: 88 y.o. male presents for annual follow-up.  Urinary frequency and urgency has not improved with Myrbetriq or Gemtesa Voiding pattern stable with frequency, urgency and variable nocturia at 1-3 Denies dysuria, gross hematuria No flank, abdominal or pelvic pain   PMH: Past Medical History:  Diagnosis Date   Allergy    Benign essential hypertension 06/23/2014   Bradycardia    Colorectal polyps    Diverticulosis    GERD (gastroesophageal reflux disease)    Hypertension     Surgical History: Past Surgical History:  Procedure Laterality Date   CATARACT EXTRACTION     COLONOSCOPY WITH PROPOFOL N/A 12/06/2017   Procedure: COLONOSCOPY WITH PROPOFOL;  Surgeon: Lin Landsman, MD;  Location: [facility] ENDOSCOPY;  Service: Gastroenterology;  Laterality: N/A;   ESOPHAGOGASTRODUODENOSCOPY (EGD) WITH PROPOFOL N/A 01/01/2018   Procedure: ESOPHAGOGASTRODUODENOSCOPY (EGD) WITH PROPOFOL;  Surgeon: Lin Landsman, MD;  Location: [facility] ENDOSCOPY;  Service: Gastroenterology;  Laterality: N/A;   PACEMAKER LEADLESS INSERTION N/A 09/07/2020   Procedure: PACEMAKER LEADLESS INSERTION;  Surgeon: Isaias Cowman, MD;  Location: [facility];  Service: Cardiovascular;  Laterality: N/A;   TONSILECTOMY, ADENOIDECTOMY, BILATERAL MYRINGOTOMY AND TUBES      Home Medications:  Allergies as of 06/19/2022       Reactions   Ace Inhibitors Cough   Lovastatin Other (See Comments)   Other reaction(s): Muscle Pain   Simvastatin    Other reaction(s): Muscle Pain         Medication List        Accurate as of June 19, 2022  9:01 AM. If you have any questions, ask your nurse or doctor.          clobetasol cream 0.05 % Commonly known as: TEMOVATE Apply topically 2 (two) times daily.   Eliquis 2.5 MG Tabs tablet Generic drug: apixaban Take 1 tablet (2.5 mg total) by mouth 2 (two) times daily.   fluticasone 50 MCG/ACT nasal spray Commonly known as: FLONASE Place 2 sprays into both nostrils daily.   Gemtesa 75 MG Tabs Generic drug: Vibegron Take 75 mg by mouth daily at 2 PM.   hydrocortisone 2.5 % cream Apply topically 2 (two) times daily as needed.   losartan 25 MG tablet Commonly known as: COZAAR Take 25 mg by mouth daily.   Multi-Vitamins Tabs Take 1 tablet by mouth daily.   omeprazole 40 MG capsule Commonly known as: PRILOSEC Take 40 mg by mouth daily.   tamsulosin 0.4 MG Caps capsule Commonly known as: FLOMAX TAKE 2 CAPSULES EVERY DAY        Allergies:  Allergies  Allergen Reactions   Ace Inhibitors Cough   Lovastatin Other (See Comments)    Other reaction(s): Muscle Pain   Simvastatin     Other reaction(s): Muscle Pain    Family History: Family History  Problem Relation Age of Onset   Diabetes Father    Diabetes Brother    Bladder Cancer Neg Hx    Kidney cancer Neg Hx    Prostate cancer Neg Hx     Social History:  reports that he has quit smoking. He has never used smokeless tobacco. He reports current alcohol use of about 1.0 standard drink of alcohol per week. He reports that he does not use drugs.   Physical Exam: BP (!) 148/76   Pulse (!) 102   Ht '5\' 11"'$  (1.803 m)   Wt 166 lb (75.3 kg)   BMI 23.15 kg/m   Constitutional:  Alert and oriented, No acute distress. HEENT: [place] AT Respiratory: Normal respiratory effort, no increased work of breathing.   Assessment & Plan:    1.  BPH with LUTS Stable on tamsulosin Bladder scan PVR 69 mL Has learned to live with frequency and urgency and does  not desire any additional therapy-PTNS was discussed Continue annual follow-up   Abbie Sons, MD  [facility] [place] [address], [place], [place] [address] [phone]

## 2022-06-20 ENCOUNTER — Ambulatory Visit (INDEPENDENT_AMBULATORY_CARE_PROVIDER_SITE_OTHER): Payer: Medicare Other | Admitting: Podiatry

## 2022-06-20 ENCOUNTER — Encounter: Payer: Self-pay | Admitting: Podiatry

## 2022-06-20 DIAGNOSIS — B351 Tinea unguium: Secondary | ICD-10-CM | POA: Diagnosis not present

## 2022-06-20 DIAGNOSIS — M79675 Pain in left toe(s): Secondary | ICD-10-CM | POA: Diagnosis not present

## 2022-06-20 DIAGNOSIS — M79674 Pain in right toe(s): Secondary | ICD-10-CM

## 2022-06-20 DIAGNOSIS — N183 Chronic kidney disease, stage 3 unspecified: Secondary | ICD-10-CM

## 2022-06-20 DIAGNOSIS — D689 Coagulation defect, unspecified: Secondary | ICD-10-CM | POA: Diagnosis not present

## 2022-06-20 NOTE — Progress Notes (Signed)
This patient returns to my office for at risk foot care.  This patient requires this care by a professional since this patient will be at risk due to having chronic kidney disease and coagulation defect.  Patient is taking eliquis.  This patient is unable to cut nails himself since the patient cannot reach his nails.These nails are painful walking and wearing shoes.  This patient presents for at risk foot care today.  General Appearance  Alert, conversant and in no acute stress.  Vascular  Dorsalis pedis and posterior tibial  pulses are  weakly palpable  bilaterally.  Capillary return is within normal limits  bilaterally. Temperature is within normal limits  bilaterally.  Neurologic  Senn-Weinstein monofilament wire test within normal limits  bilaterally. Muscle power within normal limits bilaterally.  Nails Thick disfigured discolored nails with subungual debris  from hallux to fifth toes bilaterally. No evidence of bacterial infection or drainage bilaterally.  Orthopedic  No limitations of motion  feet .  No crepitus or effusions noted.  No bony pathology or digital deformities noted.  Hammer toes 2-4  B/L.  Skin  normotropic skin with no porokeratosis noted bilaterally.  No signs of infections or ulcers noted.     Onychomycosis  Pain in right toes  Pain in left toes  Consent was obtained for treatment procedures.   Mechanical debridement of nails 1-5  bilaterally performed with a nail nipper.  Filed with dremel without incident.    Return office visit    4 months               Told patient to return for periodic foot care and evaluation due to potential at risk complications.   Gardiner Barefoot DPM

## 2022-07-04 ENCOUNTER — Emergency Department: Payer: Medicare Other

## 2022-07-04 ENCOUNTER — Other Ambulatory Visit: Payer: Self-pay

## 2022-07-04 ENCOUNTER — Emergency Department
Admission: EM | Admit: 2022-07-04 | Discharge: 2022-07-04 | Disposition: A | Discharge status: Home / Self Care | Payer: Medicare Other | Attending: Emergency Medicine | Admitting: Emergency Medicine

## 2022-07-04 DIAGNOSIS — W01198A Fall on same level from slipping, tripping and stumbling with subsequent striking against other object, initial encounter: Secondary | ICD-10-CM | POA: Insufficient documentation

## 2022-07-04 DIAGNOSIS — N183 Chronic kidney disease, stage 3 unspecified: Secondary | ICD-10-CM | POA: Diagnosis not present

## 2022-07-04 DIAGNOSIS — Z950 Presence of cardiac pacemaker: Secondary | ICD-10-CM | POA: Diagnosis not present

## 2022-07-04 DIAGNOSIS — S41111A Laceration without foreign body of right upper arm, initial encounter: Secondary | ICD-10-CM | POA: Insufficient documentation

## 2022-07-04 DIAGNOSIS — S0101XA Laceration without foreign body of scalp, initial encounter: Secondary | ICD-10-CM | POA: Insufficient documentation

## 2022-07-04 DIAGNOSIS — I129 Hypertensive chronic kidney disease with stage 1 through stage 4 chronic kidney disease, or unspecified chronic kidney disease: Secondary | ICD-10-CM | POA: Diagnosis not present

## 2022-07-04 DIAGNOSIS — Z23 Encounter for immunization: Secondary | ICD-10-CM | POA: Diagnosis not present

## 2022-07-04 DIAGNOSIS — S0990XA Unspecified injury of head, initial encounter: Secondary | ICD-10-CM | POA: Diagnosis present

## 2022-07-04 MED ORDER — TETANUS-DIPHTH-ACELL PERTUSSIS 5-2.5-18.5 LF-MCG/0.5 IM SUSY
0.5000 mL | PREFILLED_SYRINGE | Freq: Once | INTRAMUSCULAR | Status: AC
  Administered 2022-07-04: 0.5 mL via INTRAMUSCULAR
  Filled 2022-07-04: qty 0.5

## 2022-07-04 NOTE — ED Triage Notes (Signed)
Pt here via [facility] with a fall today. Pt tripped over a bench and fell and hit his head. Pt has a skin tear to his head and on his right forearm. Pt is on Eloquis, has pacemaker. Pt A&O x4.

## 2022-07-04 NOTE — Discharge Instructions (Addendum)
You will need to have your stitches removed in about 10.  You may have some bleeding from your scalp laceration.  You can put gauze and apply pressure if you have bleeding.  If you notice any redness drainage or increasing pain from the wounds please return to the emergency department.

## 2022-07-04 NOTE — ED Notes (Signed)
Pt noted to have skin tears, moderate skin tear to the top of head and moderate tear to the right forearm.

## 2022-07-04 NOTE — ED Provider Notes (Signed)
[facility] Provider Note    Event Date/Time   First MD Initiated Contact with Patient 07/04/22 1149     (approximate)   History   Fall   HPI  Michael Gilbert is a 88 y.o. male past medical history of atrial flutter, sick sinus syndrome status post pacemaker, hypertension who presents after a fall.  Patient was at [employer] when he tripped on a bench and fell forward hitting his head.  Did not lose consciousness.  He denies any pain visual change numbness tingling weakness.  Does have a laceration to his right forearm and head with bleeding.  He is on Eliquis.     Past Medical History:  Diagnosis Date   Allergy    Benign essential hypertension 06/23/2014   Bradycardia    Colorectal polyps    Diverticulosis    GERD (gastroesophageal reflux disease)    Hypertension     Patient Active Problem List   Diagnosis Date Noted   Pain due to onychomycosis of toenails of both feet 09/25/2020   Coagulation defect ([place]) 09/25/2020   Slow ventricular response 09/06/2020   Sick sinus syndrome ([place]) 09/06/2020   Syncope and collapse 09/05/2020   CKD (chronic kidney disease) stage 3, GFR 30-59 ml/min ([place]) 09/05/2020   Hypertension    Diverticulosis    History of urinary retention 06/24/2020   Atrial flutter ([place]) 12/10/2019   Benign prostatic hyperplasia with urinary frequency 51/12/5850   Helicobacter pylori antibody positive    Hemorrhage of rectum and anus    Diverticulosis of colon (without mention of hemorrhage)    Acute blood loss anemia    GI bleed 12/05/2017   Bradycardia 08/30/2016   PVC (premature ventricular contraction) 08/30/2016   HTN (hypertension), benign 06/23/2014   GERD (gastroesophageal reflux disease) 06/23/2014   OSA (obstructive sleep apnea) 06/23/2014   Benign essential hypertension 06/23/2014     Physical Exam  Triage Vital Signs: ED Triage Vitals  Enc Vitals Group     BP 07/04/22 1149 (!) 147/112     Pulse Rate  07/04/22 1149 63     Resp 07/04/22 1149 18     Temp 07/04/22 1149 97.6 F (36.4 C)     Temp Source 07/04/22 1149 Oral     SpO2 07/04/22 1149 97 %     Weight 07/04/22 1148 166 lb 0.1 oz (75.3 kg)     Height 07/04/22 1148 '5\' 11"'$  (1.803 m)     Head Circumference --      Peak Flow --      Pain Score 07/04/22 1147 3     Pain Loc --      Pain Edu? --      Excl. in [place]? --     Most recent vital signs: Vitals:   07/04/22 1149  BP: (!) 147/112  Pulse: 63  Resp: 18  Temp: 97.6 F (36.4 C)  SpO2: 97%     General: Awake, no distress.  CV:  Good peripheral perfusion.  Resp:  Normal effort.  Abd:  No distention.  Neuro:             Awake, Alert, Oriented x 3  Other:  Elliptical shaped laceration with skin flap over the right scalp, slow venous oozing, no exposed bone  About 5 cm linear laceration/skin tear over the right forearm is hemostatic no exposed tendons or bone, bony tenderness or swelling 2+ radial pulse No C-spine tenderness No chest wall tenderness Pelvis is stable  nontender No focal bony tenderness of bilateral upper and lower extremities  ED Results / Procedures / Treatments  Labs (all labs ordered are listed, but only abnormal results are displayed) Labs Reviewed - No data to display   EKG     RADIOLOGY I reviewed and interpreted the CT scan of the brain which does not show any acute intracranial process    PROCEDURES:  Critical Care performed: No  ..Laceration Repair  Date/Time: 07/04/2022 2:42 PM  Performed by: Rada Hay, MD Authorized by: Rada Hay, MD   Consent:    Consent obtained:  Verbal   Risks discussed:  Infection, pain and poor cosmetic result Universal protocol:    Patient identity confirmed:  Verbally with patient Anesthesia:    Anesthesia method:  Local infiltration   Local anesthetic:  Lidocaine 2% WITH epi Laceration details:    Location:  Scalp   Scalp location:  Mid-scalp   Length (cm):  6 Pre-procedure  details:    Preparation:  Patient was prepped and draped in usual sterile fashion and imaging obtained to evaluate for foreign bodies Exploration:    Imaging outcome: foreign body not noted     Wound extent: no foreign bodies/material noted, no underlying fracture noted and no vascular damage noted     Contaminated: no   Treatment:    Area cleansed with:  Saline   Amount of cleaning:  Standard   Irrigation solution:  Sterile saline   Irrigation volume:  200   Irrigation method:  Syringe   Visualized foreign bodies/material removed: no     Debridement:  None Skin repair:    Repair method:  Sutures   Suture size:  4-0   Suture material:  Nylon   Suture technique:  Simple interrupted   Number of sutures:  6 Approximation:    Approximation:  Close Repair type:    Repair type:  Simple Post-procedure details:    Dressing:  Non-adherent dressing .Marland KitchenLaceration Repair  Date/Time: 07/04/2022 2:43 PM  Performed by: Rada Hay, MD Authorized by: Rada Hay, MD   Consent:    Consent obtained:  Verbal   Risks discussed:  Poor cosmetic result, poor wound healing, infection and pain Universal protocol:    Patient identity confirmed:  Verbally with patient Anesthesia:    Anesthesia method:  Local infiltration   Local anesthetic:  Lidocaine 2% WITH epi Laceration details:    Location:  Shoulder/arm   Shoulder/arm location:  R lower arm   Length (cm):  8 Pre-procedure details:    Preparation:  Patient was prepped and draped in usual sterile fashion Exploration:    Limited defect created (wound extended): no     Wound extent: no fascia violation noted, no foreign bodies/material noted, no muscle damage noted, no nerve damage noted, no tendon damage noted, no underlying fracture noted and no vascular damage noted   Treatment:    Area cleansed with:  Saline   Amount of cleaning:  Standard   Irrigation solution:  Sterile water   Irrigation method:  Syringe   Debridement:   None   Undermining:  None   Scar revision: no   Skin repair:    Repair method:  Sutures   Suture size:  4-0   Suture material:  Nylon   Suture technique:  Simple interrupted   Number of sutures:  7 Approximation:    Approximation:  Close Repair type:    Repair type:  Simple Post-procedure details:    Dressing:  Non-adherent dressing   The patient is on the cardiac monitor to evaluate for evidence of arrhythmia and/or significant heart rate changes.   MEDICATIONS ORDERED IN ED: Medications  Tdap (BOOSTRIX) injection 0.5 mL (0.5 mLs Intramuscular Given 07/04/22 1255)     IMPRESSION / MDM / ASSESSMENT AND PLAN / ED COURSE  I reviewed the triage vital signs and the nursing notes.                              Patient's presentation is most consistent with acute presentation with potential threat to life or bodily function.  Differential diagnosis includes, but is not limited to, intracranial hemorrhage, cervical spine fracture, laceration, skin tear  Patient is a 88 year old male presents after mechanical fall.  Patient is anticoagulated.  He really has no complaints today other than laceration to his head and right forearm.  On exam he has a skin tear of the scalp that is not completely through and through but there is a flap with some slow venous oozing which I think would benefit from sutures.  Patient also has a deep skin tear on the right forearm which I will also repair with sutures.  Will update tetanus.  Will obtain CT head and C-spine.  He otherwise has no signs of trauma and is neurologically intact.  CT head and C-spine are negative for acute injury. Lacerations were repaired. Tetanus updated. Advised suture removal in about 10 days. Nonadherent dressing applied. Pt is appropriate for dc.      FINAL CLINICAL IMPRESSION(S) / ED DIAGNOSES   Final diagnoses:  Laceration of scalp, initial encounter  Laceration of right upper extremity, initial encounter     Rx / DC  Orders   ED Discharge Orders     None        Note:  This document was prepared using Dragon voice recognition software and may include unintentional dictation errors.   Rada Hay, MD 07/04/22 [phone]

## 2022-09-12 ENCOUNTER — Other Ambulatory Visit: Payer: Self-pay | Admitting: Urology

## 2022-10-14 ENCOUNTER — Ambulatory Visit (INDEPENDENT_AMBULATORY_CARE_PROVIDER_SITE_OTHER): Payer: Medicare Other | Admitting: Podiatry

## 2022-10-14 DIAGNOSIS — L02611 Cutaneous abscess of right foot: Secondary | ICD-10-CM

## 2022-10-14 DIAGNOSIS — M1A9XX1 Chronic gout, unspecified, with tophus (tophi): Secondary | ICD-10-CM | POA: Diagnosis not present

## 2022-10-14 DIAGNOSIS — L03111 Cellulitis of right axilla: Secondary | ICD-10-CM | POA: Diagnosis not present

## 2022-10-14 DIAGNOSIS — M10471 Other secondary gout, right ankle and foot: Secondary | ICD-10-CM

## 2022-10-14 DIAGNOSIS — L03119 Cellulitis of unspecified part of limb: Secondary | ICD-10-CM

## 2022-10-14 MED ORDER — METHYLPREDNISOLONE 4 MG PO TBPK: ORAL_TABLET | ORAL | 0 refills | Status: DC

## 2022-10-15 NOTE — Progress Notes (Signed)
  Subjective:  Patient ID: Michael Gilbert, male    DOB: 1928/02/07,  MRN: [id number]  Chief Complaint  Patient presents with   Foot Swelling    right foot pain-swollen/unable to put shoes on    88 y.o. male presents with the above complaint. History confirmed with patient.  This began about 2 weeks ago.  He has swelling and pain in both feet the right was worse and started on the outside of the foot.  His PCP placed him on a prednisone prescription for 1 week and the left side gallop better but the right side did not and it remains fairly painful.  It so swollen he cannot put shoes on because of the pain and swelling Objective:  Physical Exam: warm, good capillary refill, no trophic changes or ulcerative lesions, normal DP and PT pulses, normal sensory exam, and no major swelling or pain in the left foot, the right foot is edematous painful and erythematous around the first MPJ, dorsal MTPJ's and a small fluctuant area over the base of the fourth metatarsal.  Assessment:   1. Acute gout due to other secondary cause involving toe of right foot   2. 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  Discussed with him he likely either has an abscess of the right foot or possible gout that has not improved and gouty tophus.  Discussed etiology and treatment options for each of these.  I recommended I&D of the fluctuant area to evaluate this further.  I also recommended lab evaluation of this with uric acid.  Following sterile prep with alcohol and a local field block with 2 cc each of 2% lidocaine 0.5% Marcaine plain, the fluctuant area over the base of the fourth metatarsal dorsally was incised with a #11 blade.  It was expressed with a small amount of serous drainage and a large amount of gouty tophus.  A culture of this was taken to ensure that there is not infectious cellulitis or abscess is a  contributing factor here.  A bandage was applied and post care instructions were given.  Methylprednisolone taper was sent to his pharmacy.  Order for uric acid evaluation also given.  I will see him back as needed let him know his lab work shows.  Return if symptoms worsen or fail to improve.

## 2022-10-16 LAB — WOUND CULTURE: Organism ID, Bacteria: NONE SEEN

## 2022-10-16 LAB — URIC ACID: Uric Acid: 6.8 mg/dL (ref 3.8–8.4)

## 2022-10-21 ENCOUNTER — Encounter: Payer: Self-pay | Admitting: Podiatry

## 2022-10-21 ENCOUNTER — Ambulatory Visit (INDEPENDENT_AMBULATORY_CARE_PROVIDER_SITE_OTHER): Payer: Medicare Other | Admitting: Podiatry

## 2022-10-21 DIAGNOSIS — M1A9XX1 Chronic gout, unspecified, with tophus (tophi): Secondary | ICD-10-CM

## 2022-10-21 DIAGNOSIS — L03119 Cellulitis of unspecified part of limb: Secondary | ICD-10-CM

## 2022-10-21 DIAGNOSIS — M10471 Other secondary gout, right ankle and foot: Secondary | ICD-10-CM

## 2022-10-21 DIAGNOSIS — L02619 Cutaneous abscess of unspecified foot: Secondary | ICD-10-CM

## 2022-10-21 NOTE — Progress Notes (Signed)
This patient returns to the office for evaluation of right foot per Dr.  Sherryle Lis.   He was treated 12/11 with incision and drainage dorsum right foot.  Healing is occurring and he has no pain.   He also is here for evaluation of his lab tersts.  His left foot is completely resolved.  Vascular  Dorsalis pedis and posterior tibial pulses are palpable  B/L.  Capillary return  WNL.  Temperature gradient is  WNL.  Skin turgor  WNL  Sensorium  Senn Weinstein monofilament wire  WNL. Normal tactile sensation.  Nail Exam  Patient has normal nails with no evidence of bacterial or fungal infection.  Orthopedic  Exam  Muscle tone and muscle strength  WNL.  No limitations of motion feet  B/L.  No crepitus or joint effusion noted.  Foot type is unremarkable and digits show no abnormalities.  Bony prominences are unremarkable.  Skin  No open lesions.  Normal skin texture and turgor. Healing skin lesion dorsum of right foot with minimal redness and no swelling.    Resolved gout right foot.  ROV.  Labs for infection was negative.  Gout value is 68.  If this problem persists he is to make an appointment with Dr.  Sherryle Lis.   Gardiner Barefoot DPM

## 2022-11-06 ENCOUNTER — Ambulatory Visit (INDEPENDENT_AMBULATORY_CARE_PROVIDER_SITE_OTHER): Payer: Medicare Other | Admitting: Podiatry

## 2022-11-06 DIAGNOSIS — M10371 Gout due to renal impairment, right ankle and foot: Secondary | ICD-10-CM

## 2022-11-06 MED ORDER — COLCHICINE 0.6 MG PO TABS: ORAL_TABLET | ORAL | 2 refills | Status: DC

## 2022-11-06 NOTE — Patient Instructions (Signed)
Talk to your primary care doctor about long term gout prevention medications and your kidney function

## 2022-11-06 NOTE — Progress Notes (Signed)
  Subjective:  Patient ID: SHELTON SQUARE, male    DOB: 01-13-1928,  MRN: [id number]  Chief Complaint  Patient presents with   Gout    3 week follow up right foot    89 y.o. male presents with the above complaint. History confirmed with patient.  He has had some improvement since the last injection but has not resolved completely still has some swelling   Physical Exam: warm, good capillary refill, no trophic changes or ulcerative lesions, normal DP and PT pulses, normal sensory exam, and no major swelling or pain in the left foot, the right foot is edematous and has some tenderness still, no tophus noted  Assessment:   1. Acute gout due to other secondary cause involving toe of right foot      Plan:  Patient was evaluated and treated and all questions answered.  We again reviewed the results of his previous procedure that this is in fact a gout flare.  He says around Thanksgiving he had quite a bit of shellfish and also likes to eat cheese.  I recommended a low purine eating plan and this was given to him.  I did prescribe him colchicine to eliminate the remainder of the acute gout flare.  Discussed chronic gout with him likely related to his secondary cause of renal insufficiency and he should discuss with his primary care doctor on chronic gout medications and prevention in conjunction with his renal management.  He will follow-up with me as needed  Return if symptoms worsen or fail to improve.

## 2022-12-13 ENCOUNTER — Other Ambulatory Visit: Payer: Self-pay | Admitting: Urology

## 2023-02-20 ENCOUNTER — Ambulatory Visit (INDEPENDENT_AMBULATORY_CARE_PROVIDER_SITE_OTHER): Payer: Medicare Other | Admitting: Podiatry

## 2023-02-20 ENCOUNTER — Encounter: Payer: Self-pay | Admitting: Podiatry

## 2023-02-20 VITALS — BP 139/78 | HR 86

## 2023-02-20 DIAGNOSIS — M79675 Pain in left toe(s): Secondary | ICD-10-CM | POA: Diagnosis not present

## 2023-02-20 DIAGNOSIS — B351 Tinea unguium: Secondary | ICD-10-CM | POA: Diagnosis not present

## 2023-02-20 DIAGNOSIS — N183 Chronic kidney disease, stage 3 unspecified: Secondary | ICD-10-CM

## 2023-02-20 DIAGNOSIS — M79674 Pain in right toe(s): Secondary | ICD-10-CM | POA: Diagnosis not present

## 2023-02-20 NOTE — Progress Notes (Signed)
This patient returns to my office for at risk foot care.  This patient requires this care by a professional since this patient will be at risk due to having chronic kidney disease and coagulation defect.  Patient is taking eliquis.  This patient is unable to cut nails himself since the patient cannot reach his nails.These nails are painful walking and wearing shoes.  This patient presents for at risk foot care today.  General Appearance  Alert, conversant and in no acute stress.  Vascular  Dorsalis pedis and posterior tibial  pulses are  weakly palpable  bilaterally.  Capillary return is within normal limits  bilaterally. Temperature is within normal limits  bilaterally.  Neurologic  Senn-Weinstein monofilament wire test within normal limits  bilaterally. Muscle power within normal limits bilaterally.  Nails Thick disfigured discolored nails with subungual debris  from hallux to fifth toes bilaterally. No evidence of bacterial infection or drainage bilaterally.  Orthopedic  No limitations of motion  feet .  No crepitus or effusions noted.  No bony pathology or digital deformities noted.  Hammer toes 2-4  B/L.  Skin  normotropic skin with no porokeratosis noted bilaterally.  No signs of infections or ulcers noted.     Onychomycosis  Pain in right toes  Pain in left toes  Consent was obtained for treatment procedures.   Mechanical debridement of nails 1-5  bilaterally performed with a nail nipper.  Filed with dremel without incident.    Return office visit    4 months               Told patient to return for periodic foot care and evaluation due to potential at risk complications.   Findley Blankenbaker DPM  

## 2023-03-25 ENCOUNTER — Other Ambulatory Visit: Payer: Self-pay | Admitting: Urology

## 2023-06-20 ENCOUNTER — Ambulatory Visit: Payer: Medicare Other | Admitting: Urology

## 2023-06-23 ENCOUNTER — Ambulatory Visit: Payer: Medicare Other | Admitting: Podiatry

## 2023-06-30 ENCOUNTER — Ambulatory Visit: Payer: Medicare Other | Admitting: Podiatry

## 2023-07-01 ENCOUNTER — Emergency Department: Payer: Medicare Other

## 2023-07-01 ENCOUNTER — Inpatient Hospital Stay
Admission: EM | Admit: 2023-07-01 | Discharge: 2023-07-03 | DRG: 481 | Disposition: A | Discharge status: Home Health | Payer: Medicare Other | Attending: Internal Medicine | Admitting: Internal Medicine

## 2023-07-01 ENCOUNTER — Other Ambulatory Visit: Payer: Self-pay

## 2023-07-01 DIAGNOSIS — I4892 Unspecified atrial flutter: Secondary | ICD-10-CM | POA: Diagnosis present

## 2023-07-01 DIAGNOSIS — Z87891 Personal history of nicotine dependence: Secondary | ICD-10-CM | POA: Diagnosis not present

## 2023-07-01 DIAGNOSIS — I10 Essential (primary) hypertension: Secondary | ICD-10-CM | POA: Diagnosis not present

## 2023-07-01 DIAGNOSIS — J84112 Idiopathic pulmonary fibrosis: Secondary | ICD-10-CM | POA: Diagnosis present

## 2023-07-01 DIAGNOSIS — S72001A Fracture of unspecified part of neck of right femur, initial encounter for closed fracture: Principal | ICD-10-CM | POA: Diagnosis present

## 2023-07-01 DIAGNOSIS — J438 Other emphysema: Secondary | ICD-10-CM | POA: Diagnosis not present

## 2023-07-01 DIAGNOSIS — N1832 Chronic kidney disease, stage 3b: Secondary | ICD-10-CM | POA: Diagnosis present

## 2023-07-01 DIAGNOSIS — I483 Typical atrial flutter: Secondary | ICD-10-CM

## 2023-07-01 DIAGNOSIS — Z79899 Other long term (current) drug therapy: Secondary | ICD-10-CM

## 2023-07-01 DIAGNOSIS — J849 Interstitial pulmonary disease, unspecified: Secondary | ICD-10-CM

## 2023-07-01 DIAGNOSIS — Z8616 Personal history of COVID-19: Secondary | ICD-10-CM | POA: Diagnosis not present

## 2023-07-01 DIAGNOSIS — Z888 Allergy status to other drugs, medicaments and biological substances status: Secondary | ICD-10-CM

## 2023-07-01 DIAGNOSIS — J449 Chronic obstructive pulmonary disease, unspecified: Secondary | ICD-10-CM

## 2023-07-01 DIAGNOSIS — Z66 Do not resuscitate: Secondary | ICD-10-CM | POA: Diagnosis present

## 2023-07-01 DIAGNOSIS — G4733 Obstructive sleep apnea (adult) (pediatric): Secondary | ICD-10-CM | POA: Diagnosis present

## 2023-07-01 DIAGNOSIS — I129 Hypertensive chronic kidney disease with stage 1 through stage 4 chronic kidney disease, or unspecified chronic kidney disease: Secondary | ICD-10-CM | POA: Diagnosis present

## 2023-07-01 DIAGNOSIS — I495 Sick sinus syndrome: Secondary | ICD-10-CM | POA: Diagnosis present

## 2023-07-01 DIAGNOSIS — Z7901 Long term (current) use of anticoagulants: Secondary | ICD-10-CM | POA: Diagnosis not present

## 2023-07-01 DIAGNOSIS — Z950 Presence of cardiac pacemaker: Secondary | ICD-10-CM

## 2023-07-01 DIAGNOSIS — K219 Gastro-esophageal reflux disease without esophagitis: Secondary | ICD-10-CM | POA: Diagnosis present

## 2023-07-01 DIAGNOSIS — R911 Solitary pulmonary nodule: Secondary | ICD-10-CM

## 2023-07-01 DIAGNOSIS — S7291XA Unspecified fracture of right femur, initial encounter for closed fracture: Secondary | ICD-10-CM | POA: Diagnosis present

## 2023-07-01 DIAGNOSIS — J439 Emphysema, unspecified: Secondary | ICD-10-CM | POA: Diagnosis present

## 2023-07-01 DIAGNOSIS — E871 Hypo-osmolality and hyponatremia: Secondary | ICD-10-CM | POA: Diagnosis present

## 2023-07-01 DIAGNOSIS — W010XXA Fall on same level from slipping, tripping and stumbling without subsequent striking against object, initial encounter: Secondary | ICD-10-CM | POA: Diagnosis present

## 2023-07-01 DIAGNOSIS — R0602 Shortness of breath: Secondary | ICD-10-CM | POA: Diagnosis not present

## 2023-07-01 DIAGNOSIS — R0600 Dyspnea, unspecified: Secondary | ICD-10-CM

## 2023-07-01 DIAGNOSIS — N183 Chronic kidney disease, stage 3 unspecified: Secondary | ICD-10-CM | POA: Diagnosis present

## 2023-07-01 LAB — TYPE AND SCREEN
ABO/RH(D): O POS
Antibody Screen: NEGATIVE

## 2023-07-01 LAB — CBC
HCT: 38.4 % — ABNORMAL LOW (ref 39.0–52.0)
Hemoglobin: 12.6 g/dL — ABNORMAL LOW (ref 13.0–17.0)
MCH: 29.6 pg (ref 26.0–34.0)
MCHC: 32.8 g/dL (ref 30.0–36.0)
MCV: 90.4 fL (ref 80.0–100.0)
Platelets: 260 10*3/uL (ref 150–400)
RBC: 4.25 MIL/uL (ref 4.22–5.81)
RDW: 14.8 % (ref 11.5–15.5)
WBC: 7.8 10*3/uL (ref 4.0–10.5)
nRBC: 0 % (ref 0.0–0.2)

## 2023-07-01 LAB — TROPONIN I (HIGH SENSITIVITY)
Troponin I (High Sensitivity): 13 ng/L (ref <18)
Troponin I (High Sensitivity): 11 ng/L (ref <18)

## 2023-07-01 LAB — BASIC METABOLIC PANEL
Anion gap: 11 (ref 5–15)
BUN: 45 mg/dL — ABNORMAL HIGH (ref 8–23)
CO2: 20 mmol/L — ABNORMAL LOW (ref 22–32)
Calcium: 9.1 mg/dL (ref 8.9–10.3)
Chloride: 101 mmol/L (ref 98–111)
Creatinine, Ser: 1.75 mg/dL — ABNORMAL HIGH (ref 0.61–1.24)
GFR, Estimated: 36 mL/min — ABNORMAL LOW (ref >60)
Glucose, Bld: 101 mg/dL — ABNORMAL HIGH (ref 70–99)
Potassium: 4.4 mmol/L (ref 3.5–5.1)
Sodium: 132 mmol/L — ABNORMAL LOW (ref 135–145)

## 2023-07-01 LAB — PROCALCITONIN: Procalcitonin: <0.1 ng/mL

## 2023-07-01 LAB — D-DIMER, QUANTITATIVE: D-Dimer, Quant: 2.61 ug{FEU}/mL — ABNORMAL HIGH (ref 0.00–0.50)

## 2023-07-01 MED ORDER — ALLOPURINOL 100 MG PO TABS
100.0000 mg | ORAL_TABLET | Freq: Every day | ORAL | Status: DC
  Administered 2023-07-02: 100 mg via ORAL
  Filled 2023-07-01 (×2): qty 1

## 2023-07-01 MED ORDER — IPRATROPIUM-ALBUTEROL 0.5-2.5 (3) MG/3ML IN SOLN
3.0000 mL | Freq: Four times a day (QID) | RESPIRATORY_TRACT | Status: DC
  Administered 2023-07-01 – 2023-07-02 (×4): 3 mL via RESPIRATORY_TRACT
  Filled 2023-07-01 (×3): qty 3

## 2023-07-01 MED ORDER — ACETAMINOPHEN 325 MG PO TABS: 650.0000 mg | ORAL_TABLET | Freq: Four times a day (QID) | ORAL | Status: DC | PRN

## 2023-07-01 MED ORDER — METOPROLOL SUCCINATE ER 25 MG PO TB24
12.5000 mg | ORAL_TABLET | Freq: Every day | ORAL | Status: DC
  Administered 2023-07-02 – 2023-07-03 (×2): 12.5 mg via ORAL
  Filled 2023-07-01 (×2): qty 1

## 2023-07-01 MED ORDER — ONDANSETRON HCL 4 MG/2ML IJ SOLN: 4.0000 mg | Freq: Four times a day (QID) | INTRAMUSCULAR | Status: DC | PRN

## 2023-07-01 MED ORDER — LOSARTAN POTASSIUM 25 MG PO TABS
25.0000 mg | ORAL_TABLET | Freq: Every day | ORAL | Status: DC
  Administered 2023-07-03: 25 mg via ORAL
  Filled 2023-07-01: qty 1

## 2023-07-01 MED ORDER — PANTOPRAZOLE SODIUM 40 MG PO TBEC
40.0000 mg | DELAYED_RELEASE_TABLET | Freq: Every day | ORAL | Status: DC
  Administered 2023-07-02 – 2023-07-03 (×2): 40 mg via ORAL
  Filled 2023-07-01 (×2): qty 1

## 2023-07-01 MED ORDER — PREDNISONE 20 MG PO TABS
40.0000 mg | ORAL_TABLET | Freq: Every day | ORAL | Status: DC
  Administered 2023-07-01 – 2023-07-03 (×3): 40 mg via ORAL
  Filled 2023-07-01 (×3): qty 2

## 2023-07-01 MED ORDER — HYDROMORPHONE HCL 1 MG/ML IJ SOLN: 0.5000 mg | INTRAMUSCULAR | Status: DC | PRN

## 2023-07-01 MED ORDER — LACTATED RINGERS IV BOLUS
1000.0000 mL | Freq: Once | INTRAVENOUS | Status: AC
  Administered 2023-07-01: 1000 mL via INTRAVENOUS

## 2023-07-01 MED ORDER — BUPROPION HCL ER (XL) 150 MG PO TB24
150.0000 mg | ORAL_TABLET | Freq: Every day | ORAL | Status: DC
  Administered 2023-07-02 – 2023-07-03 (×2): 150 mg via ORAL
  Filled 2023-07-01 (×2): qty 1

## 2023-07-01 MED ORDER — OXYCODONE HCL 5 MG PO TABS: 5.0000 mg | ORAL_TABLET | Freq: Four times a day (QID) | ORAL | Status: DC | PRN

## 2023-07-01 MED ORDER — CEFAZOLIN SODIUM-DEXTROSE 2-4 GM/100ML-% IV SOLN: 2.0000 g | INTRAVENOUS | Status: DC

## 2023-07-01 MED ORDER — POLYETHYLENE GLYCOL 3350 17 G PO PACK: 17.0000 g | PACK | Freq: Every day | ORAL | Status: DC | PRN

## 2023-07-01 MED ORDER — IOHEXOL 350 MG/ML SOLN
60.0000 mL | Freq: Once | INTRAVENOUS | Status: AC | PRN
  Administered 2023-07-01: 60 mL via INTRAVENOUS

## 2023-07-01 MED ORDER — TAMSULOSIN HCL 0.4 MG PO CAPS
0.4000 mg | ORAL_CAPSULE | Freq: Two times a day (BID) | ORAL | Status: DC
  Administered 2023-07-02 – 2023-07-03 (×3): 0.4 mg via ORAL
  Filled 2023-07-01 (×3): qty 1

## 2023-07-01 NOTE — Assessment & Plan Note (Signed)
Patient is coming in after a fall approximately 8 days ago with x-ray demonstrating an impacted right femoral neck fracture.  - Orthopedic surgery consulted; appreciate their recommendations - N.p.o. after midnight - Pain control with oxycodone and Dilaudid - Nonweightbearing till postop - PT and OT postop

## 2023-07-01 NOTE — ED Notes (Signed)
Patient placed himself on fractured bedpan at this time. Call light within reach

## 2023-07-01 NOTE — Assessment & Plan Note (Addendum)
Patient's baseline creatinine predominantly between 1.3 and 1.8, currently within baseline range.  - Will continue to monitor renal indices while admitted

## 2023-07-01 NOTE — Assessment & Plan Note (Signed)
-   CPAP at bedtime 

## 2023-07-01 NOTE — Assessment & Plan Note (Signed)
History of mild to moderate emphysema seen on imaging with clinical diagnosis made by PCP.  No PFTs to confirm obstructive pattern.  - Management of acute shortness of breath as noted above

## 2023-07-01 NOTE — Assessment & Plan Note (Addendum)
Patient endorses significant dyspnea on exertion that has been occurring over the last few days.  CTA is negative for PE.  He has a history of underlying COPD, in addition to ILD that was first noted in 2021 but has not been worked up. No evidence of pneumonia.  No evidence of hypervolemia on examination to suggest CHF exacerbation.  Will treat as ILD versus COPD exacerbation.  - Continuous pulse oximetry - Start DuoNebs every 6 hours - Start prednisone 40 mg daily - Procalcitonin pending.  Given low suspicion for bacterial pneumonia, hold off on antimicrobials at this time

## 2023-07-01 NOTE — Assessment & Plan Note (Signed)
Chronic hyponatremia dating back to at least 2022.  Stable at this time

## 2023-07-01 NOTE — Assessment & Plan Note (Signed)
Nodular opacity noted in the right upper lobe, that will need repeat imaging in 3 months

## 2023-07-01 NOTE — H&P (Addendum)
History and Physical    Patient: Michael Gilbert AOZ:[id number] DOB: 06/04/1928 DOA: 07/01/2023 DOS: the patient was seen and examined on 07/01/2023 PCP: [name], MD  Patient coming from: ALF/ILF  Chief Complaint:  Chief Complaint  Patient presents with   Hip Injury   Shortness of Breath   HPI: Michael Gilbert is a 89 y.o. male with medical history significant of atrial fibrillation/flutter on Eliquis, hypertension, CKD stage IIIb, OSA, sick sinus syndrome s/p PPM, who presents to the ED due to hip pain and shortness of breath.  Mr. Michael Gilbert states he was trying to kill a spider approximately 1 week ago when he lost his footing and landed onto his right hip.  He has been experiencing hip pain since then but was still able to bear weight with the assistance of his walker.  Previous to his fall, he only used a cane.  In addition, approximately 4 to 5 days ago, he developed dyspnea on exertion that has made it difficult for him to even walk across the room.  He denies any shortness of breath at rest, orthopnea.  He feels that he intermittently has some lower extremity swelling.  He denies any cough, congestion, fever, nausea, vomiting, chest pain.  ED course: On arrival to the ED, patient was hypertensive at 140/73 with heart rate of 63.  He was saturating at 96% on room air.  He was afebrile at 98.4. Initial workup notable for hemoglobin of 12.6, sodium of 132, bicarb 20, glucose 101, BUN 45, creatinine 1.75 with GFR of 36.  D-dimer elevated 2.61; CTA ordered and pending.  Hip x-ray was obtained that demonstrated an impacted right femoral neck fracture.  Orthopedic surgery consulted.  [facility] contacted for admission.  Review of Systems: As mentioned in the history of present illness. All other systems reviewed and are negative.  Past Medical History:  Diagnosis Date   Allergy    Benign essential hypertension 06/23/2014   Bradycardia    Colorectal polyps    Diverticulosis    GERD  (gastroesophageal reflux disease)    Hypertension    Past Surgical History:  Procedure Laterality Date   CATARACT EXTRACTION     COLONOSCOPY WITH PROPOFOL N/A 12/06/2017   Procedure: COLONOSCOPY WITH PROPOFOL;  Surgeon: Toney Reil, MD;  Location: [facility] ENDOSCOPY;  Service: Gastroenterology;  Laterality: N/A;   ESOPHAGOGASTRODUODENOSCOPY (EGD) WITH PROPOFOL N/A 01/01/2018   Procedure: ESOPHAGOGASTRODUODENOSCOPY (EGD) WITH PROPOFOL;  Surgeon: Toney Reil, MD;  Location: [facility] ENDOSCOPY;  Service: Gastroenterology;  Laterality: N/A;   PACEMAKER LEADLESS INSERTION N/A 09/07/2020   Procedure: PACEMAKER LEADLESS INSERTION;  Surgeon: Marcina Millard, MD;  Location: [facility] INVASIVE CV LAB;  Service: Cardiovascular;  Laterality: N/A;   TONSILECTOMY, ADENOIDECTOMY, BILATERAL MYRINGOTOMY AND TUBES     Social History:  reports that he has quit smoking. He has never used smokeless tobacco. He reports that he does not currently use alcohol after a past usage of about 1.0 standard drink of alcohol per week. He reports that he does not use drugs.  Allergies  Allergen Reactions   Ace Inhibitors Cough   Lovastatin Other (See Comments)    Other reaction(s): Muscle Pain   Simvastatin     Other reaction(s): Muscle Pain    Family History  Problem Relation Age of Onset   Diabetes Father    Diabetes Brother    Bladder Cancer Neg Hx    Kidney cancer Neg Hx    Prostate cancer Neg Hx     Prior  to Admission medications   Medication Sig Start Date End Date Taking? Authorizing Provider  clobetasol cream (TEMOVATE) 0.05 % Apply topically 2 (two) times daily. 03/15/21   [provider]  colchicine 0.6 MG tablet Take 1.2mg  (2 tablets) then 0.6mg  (1 tablet) 1 hour after. Then, take 1 tablet every day for 7 days. 11/06/22   McDonald, Adam R, DPM  ELIQUIS 2.5 MG TABS tablet Take 1 tablet (2.5 mg total) by mouth 2 (two) times daily. 09/09/20   Zigmund Daniel., MD  fluticasone [facility]) 50  MCG/ACT nasal spray Place 2 sprays into both nostrils daily. 05/14/21   [provider]  hydrocortisone 2.5 % cream Apply topically 2 (two) times daily as needed. 03/15/21   [provider]  losartan (COZAAR) 25 MG tablet Take 25 mg by mouth daily.  04/07/19   [provider]  methylPREDNISolone (MEDROL DOSEPAK) 4 MG TBPK tablet 6 day dose pack - take as directed 10/14/22   Edwin Cap, DPM  Multiple Vitamin (MULTI-VITAMINS) TABS Take 1 tablet by mouth daily.     [provider]  omeprazole (PRILOSEC) 40 MG capsule Take 40 mg by mouth daily.  03/06/18   [provider]  tamsulosin (FLOMAX) 0.4 MG CAPS capsule TAKE 2 CAPSULES EVERY DAY 03/25/23   Stoioff, Verna Czech, MD  Vibegron (GEMTESA) 75 MG TABS Take 75 mg by mouth daily at 2 PM. 06/15/21   Riki Altes, MD    Physical Exam: Vitals:   07/01/23 1530 07/01/23 1545 07/01/23 1600 07/01/23 1615  BP: (!) 141/90  (!) 140/73   Pulse: 63 (!) 59 61 60  Resp: 19 19 20    Temp:      SpO2: 97% 95% 96% 98%  Weight:      Height:       Physical Exam Vitals and nursing note reviewed.  Constitutional:      General: He is not in acute distress.    Appearance: He is normal weight.  HENT:     Head: Normocephalic and atraumatic.  Eyes:     Extraocular Movements: Extraocular movements intact.     Pupils: Pupils are equal, round, and reactive to light.  Neck:     Vascular: No JVD.  Cardiovascular:     Rate and Rhythm: Normal rate and regular rhythm.     Heart sounds: No murmur heard. Pulmonary:     Effort: Pulmonary effort is normal. No tachypnea.     Breath sounds: Rales (Fine rales heard bilaterally up to the mid lung fields) present. No decreased breath sounds, wheezing or rhonchi.  Abdominal:     Palpations: Abdomen is soft.     Tenderness: There is no abdominal tenderness.  Musculoskeletal:     Cervical back: Normal range of motion and neck supple.     Right lower leg: No tenderness. No edema.      Left lower leg: No tenderness. No edema.  Skin:    General: Skin is warm and dry.  Neurological:     General: No focal deficit present.     Mental Status: He is alert and oriented to person, place, and time.  Psychiatric:        Mood and Affect: Mood normal.        Behavior: Behavior normal.    Data Reviewed: CBC with WBC 7.8, hemoglobin 12.6, MCV of 90.4, platelets of 260 BMP with sodium of 132, potassium 4.4, bicarb 20, glucose 101, BUN 45, creatinine 1.75 and GFR of  36 Troponin negative at 13 D-dimer elevated 2.61  EKG was obtained and personally reviewed.  Ventricular pacing with rate of 60 with underlying flutter.  CT Angio Chest PE W and/or Wo Contrast  Result Date: 07/01/2023 CLINICAL DATA:  Short of breath, history of right hip fracture, positive D-dimer EXAM: CT ANGIOGRAPHY CHEST WITH CONTRAST TECHNIQUE: Multidetector CT imaging of the chest was performed using the standard protocol during bolus administration of intravenous contrast. Multiplanar CT image reconstructions and MIPs were obtained to evaluate the vascular anatomy. RADIATION DOSE REDUCTION: This exam was performed according to the departmental dose-optimization program which includes automated exposure control, adjustment of the mA and/or kV according to patient size and/or use of iterative reconstruction technique. CONTRAST:  60mL OMNIPAQUE IOHEXOL 350 MG/ML SOLN COMPARISON:  07/01/2023, 09/05/2020 FINDINGS: Cardiovascular: This is a technically adequate evaluation of the pulmonary vasculature. No filling defects or pulmonary emboli. The heart is enlarged without pericardial effusion. Lead less pacemaker identified within the right ventricular apex. Normal caliber of the thoracic aorta. Atherosclerosis of the aorta and coronary vasculature. Mediastinum/Nodes: No enlarged mediastinal, hilar, or axillary lymph nodes. Thyroid gland, trachea, and esophagus demonstrate no significant findings. Lungs/Pleura: Upper lobe  predominant centrilobular emphysema. Progressive areas of bibasilar scarring and fibrosis. Progressive bilateral bronchiectasis. The central airways are patent. Nodular consolidation within the right upper lobe abuts the minor fissure, measuring 1.6 x 1.5 x 1.7 cm, reference image 76/6. This is new since prior study. No other acute airspace disease, effusion, or pneumothorax. Upper Abdomen: No acute abnormality. Musculoskeletal: No acute or destructive bony abnormalities. Reconstructed images demonstrate no additional findings. Review of the MIP images confirms the above findings. IMPRESSION: 1. No evidence of pulmonary embolus. 2. Nodular right upper lobe consolidation abutting the minor fissure, new since prior study. Three-month follow-up CT is recommended to assess for resolution. If this persists, follow-up PET CT could be considered at that time if the patient would be a therapy candidate should neoplasm be detected. 3. Aortic Atherosclerosis (ICD10-I70.0) and Emphysema (ICD10-J43.9). 4. Cardiomegaly, with coronary artery atherosclerosis. Electronically Signed   By: Sharlet Salina M.D.   On: 07/01/2023 18:13   DG Hip Unilat W or Wo Pelvis 2-3 Views Right  Result Date: 07/01/2023 CLINICAL DATA:  sob, right hip pain EXAM: DG HIP (WITH OR WITHOUT PELVIS) 2-3V RIGHT COMPARISON:  09/05/2020 FINDINGS: Impacted mid cervical fracture of the right femoral neck. No dislocation. Bony pelvis intact. Degenerative disc disease L4-5. IMPRESSION: Impacted right femoral neck fracture. Electronically Signed   By: Corlis Leak M.D.   On: 07/01/2023 16:56   DG Chest 1 View  Result Date: 07/01/2023 CLINICAL DATA:  Hip pain, shortness of breath EXAM: CHEST  1 VIEW COMPARISON:  05/18/2016 FINDINGS: Coarse airspace opacities laterally in the right mid lung, new since previous. Prominent alveolar opacities in the lung bases left greater than right, increased since previous. Heart size and mediastinal contours are within normal  limits. Aortic Atherosclerosis (ICD10-170.0). No effusion. Visualized bones unremarkable. IMPRESSION: Coarse bilateral airspace opacities as above, increased since 2017 but of indeterminate acuity. Electronically Signed   By: Corlis Leak M.D.   On: 07/01/2023 16:54    Results are pending, will review when available.  Assessment and Plan:  * Right femoral fracture (HCC) Patient is coming in after a fall approximately 8 days ago with x-ray demonstrating an impacted right femoral neck fracture.  - Orthopedic surgery consulted; appreciate their recommendations - N.p.o. after midnight - Pain control with oxycodone and Dilaudid - Nonweightbearing till  postop - PT and OT postop  Shortness of breath Patient endorses significant dyspnea on exertion that has been occurring over the last few days.  CTA is negative for PE.  He has a history of underlying COPD, in addition to ILD that was first noted in 2021 but has not been worked up. No evidence of pneumonia.  No evidence of hypervolemia on examination to suggest CHF exacerbation.  Will treat as ILD versus COPD exacerbation.  - Continuous pulse oximetry - Start DuoNebs every 6 hours - Start prednisone 40 mg daily - Procalcitonin pending.  Given low suspicion for bacterial pneumonia, hold off on antimicrobials at this time  Interstitial lung disease (HCC) Per chart review, patient had a CT of the chest in 2021 that demonstrated patchy areas of GGO, septal thickening, traction bronchiectasis; categorized as probable UIP.  Overall, likely IPF, however will require formal workup by pulmonology. On personal review of CT today, fibrotic changes seem to have mildly progressed.   - Management of shortness of breath as noted above - Will need outpatient referral to pulmonology if this is in line with patient's goals of care  COPD (chronic obstructive pulmonary disease) (HCC) History of mild to moderate emphysema seen on imaging with clinical diagnosis made  by PCP.  No PFTs to confirm obstructive pattern.  - Management of acute shortness of breath as noted above  Atrial flutter (HCC) - Hold home Eliquis in the setting of surgery tomorrow  CKD (chronic kidney disease) stage 3, GFR 30-59 ml/min (HCC) Patient's baseline creatinine predominantly between 1.3 and 1.8, currently within baseline range.  - Will continue to monitor renal indices while admitted  HTN (hypertension), benign - Continue home antihypertensives  OSA (obstructive sleep apnea) - CPAP at bedtime  Hyponatremia Chronic hyponatremia dating back to at least 2022.  Stable at this time  Lung nodule Nodular opacity noted in the right upper lobe, that will need repeat imaging in 3 months  Advance Care Planning:   Code Status: DNR/DNI. Confirmed by patient. He states he has spoken to his PCP regarding this as well. He is amenable to short term intubation for procedure only tomorrow.   Consults: Orthopedic surgery  Family Communication: Patient's son updated at bedside.   Severity of Illness: The appropriate patient status for this patient is INPATIENT. Inpatient status is judged to be reasonable and necessary in order to provide the required intensity of service to ensure the patient's safety. The patient's presenting symptoms, physical exam findings, and initial radiographic and laboratory data in the context of their chronic comorbidities is felt to place them at high risk for further clinical deterioration. Furthermore, it is not anticipated that the patient will be medically stable for discharge from the hospital within 2 midnights of admission.   * I certify that at the point of admission it is my clinical judgment that the patient will require inpatient hospital care spanning beyond 2 midnights from the point of admission due to high intensity of service, high risk for further deterioration and high frequency of surveillance required.*  Author: Verdene Lennert,  MD 07/01/2023 6:42 PM  For on call review [email or web address].

## 2023-07-01 NOTE — Consult Note (Signed)
ORTHOPAEDIC CONSULTATION  REQUESTING PHYSICIAN: Sharman Cheek, MD  Chief Complaint:   Right hip pain.  History of Present Illness: Michael Gilbert is a 89 y.o. 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  Apparently, the patient fell 8 days ago and injured his right hip.  He saw his primary care provider the following day who did not feel that any x-rays were necessary based on his examination and was sent home.  Since then, he has been ambulating with a walker trying to offload his leg.  However, he noted his pain has been worsening over the past few days.  In addition, he began to develop increasing shortness of breath over the past few days, so he contacted his primary care provider today who advised him to go to [facility] facility for a chest x-ray.  While there, he also complained of hip pain so an x-ray of the pelvis and right hip also was obtained which confirmed the presence of a valgus impacted right femoral neck fracture.  Therefore, the patient was sent over to the emergency room for further evaluation and treatment of both conditions.  The patient denies any associated injury resulting from his fall 8 days ago.  He did not strike his head or lose consciousness.  The patient also denies any lightheadedness, dizziness, chest pain, shortness of breath, or other symptoms which may have precipitated the fall 8 days.  However, he does note that he and his wife both had COVID about 3 weeks ago.  Past Medical History:  Diagnosis Date   Allergy    Benign essential hypertension 06/23/2014   Bradycardia    Colorectal polyps    Diverticulosis    GERD (gastroesophageal reflux disease)    Hypertension    Past Surgical History:  Procedure Laterality Date    CATARACT EXTRACTION     COLONOSCOPY WITH PROPOFOL N/A 12/06/2017   Procedure: COLONOSCOPY WITH PROPOFOL;  Surgeon: Toney Reil, MD;  Location: [facility] ENDOSCOPY;  Service: Gastroenterology;  Laterality: N/A;   ESOPHAGOGASTRODUODENOSCOPY (EGD) WITH PROPOFOL N/A 01/01/2018   Procedure: ESOPHAGOGASTRODUODENOSCOPY (EGD) WITH PROPOFOL;  Surgeon: Toney Reil, MD;  Location: [facility] ENDOSCOPY;  Service: Gastroenterology;  Laterality: N/A;   PACEMAKER LEADLESS INSERTION N/A 09/07/2020   Procedure: PACEMAKER LEADLESS INSERTION;  Surgeon: Marcina Millard, MD;  Location: [facility] INVASIVE CV LAB;  Service: Cardiovascular;  Laterality: N/A;   TONSILECTOMY, ADENOIDECTOMY, BILATERAL MYRINGOTOMY AND TUBES     Social History   Socioeconomic History   Marital status: Married    Spouse name: Not on file   Number of children: Not on file   Years of education: Not on file   Highest education level: Not on file  Occupational History   Not on file  Tobacco Use   Smoking status: Former   Smokeless tobacco: Never  Vaping Use   Vaping status: Never Used  Substance and Sexual Activity   Alcohol use: Not Currently    Alcohol/week: 1.0 standard drink of alcohol    Types: 1 Glasses of wine per week    Comment: RARELY   Drug use: No   Sexual activity: Not Currently    Birth control/protection: None  Other Topics Concern   Not on file  Social History Narrative   Not on file   Social Determinants of Health   Financial Resource Strain: Patient Declined (05/06/2023)   Received from  [facility]   Overall Financial Resource Strain (CARDIA)    Difficulty of Paying Living Expenses: Patient declined  Food Insecurity: Patient Declined (05/06/2023)   Received from [facility]   Hunger Vital Sign    Worried About Running Out of Food in the Last Year: Patient declined    Ran Out of Food in the Last Year: Patient declined  Transportation Needs: Patient Declined (05/06/2023)    Received from [facility] - Transportation    In the past 12 months, has lack of transportation kept you from medical appointments or from getting medications?: Patient declined    Lack of Transportation (Non-Medical): Patient declined  Physical Activity: Not on file  Stress: Not on file  Social Connections: Not on file   Family History  Problem Relation Age of Onset   Diabetes Father    Diabetes Brother    Bladder Cancer Neg Hx    Kidney cancer Neg Hx    Prostate cancer Neg Hx    Allergies  Allergen Reactions   Ace Inhibitors Cough   Lovastatin Other (See Comments)    Other reaction(s): Muscle Pain   Simvastatin     Other reaction(s): Muscle Pain   Prior to Admission medications   Medication Sig Start Date End Date Taking? Authorizing Provider  clobetasol cream (TEMOVATE) 0.05 % Apply topically 2 (two) times daily. 03/15/21   [provider]  colchicine 0.6 MG tablet Take 1.2mg  (2 tablets) then 0.6mg  (1 tablet) 1 hour after. Then, take 1 tablet every day for 7 days. 11/06/22   McDonald, Adam R, DPM  ELIQUIS 2.5 MG TABS tablet Take 1 tablet (2.5 mg total) by mouth 2 (two) times daily. 09/09/20   Zigmund Daniel., MD  fluticasone [facility]) 50 MCG/ACT nasal spray Place 2 sprays into both nostrils daily. 05/14/21   [provider]  hydrocortisone 2.5 % cream Apply topically 2 (two) times daily as needed. 03/15/21   [provider]  losartan (COZAAR) 25 MG tablet Take 25 mg by mouth daily.  04/07/19   [provider]  methylPREDNISolone (MEDROL DOSEPAK) 4 MG TBPK tablet 6 day dose pack - take as directed 10/14/22   Edwin Cap, DPM  Multiple Vitamin (MULTI-VITAMINS) TABS Take 1 tablet by mouth daily.     [provider]  omeprazole (PRILOSEC) 40 MG capsule Take 40 mg by mouth daily.  03/06/18   [provider]  tamsulosin (FLOMAX) 0.4 MG CAPS capsule TAKE 2 CAPSULES EVERY DAY 03/25/23   Stoioff, Verna Czech, MD  Vibegron (GEMTESA) 75 MG TABS Take 75 mg by mouth daily at 2 PM. 06/15/21   Stoioff, Verna Czech, MD   DG Hip Unilat W or Wo Pelvis 2-3 Views Right  Result Date: 07/01/2023 CLINICAL DATA:  sob, right hip pain EXAM: DG HIP (WITH OR WITHOUT PELVIS) 2-3V RIGHT COMPARISON:  09/05/2020 FINDINGS: Impacted mid cervical fracture of the right femoral neck. No dislocation. Bony pelvis intact. Degenerative disc disease L4-5. IMPRESSION: Impacted right femoral neck fracture. Electronically Signed   By: Corlis Leak M.D.   On: 07/01/2023 16:56   DG Chest 1 View  Result Date: 07/01/2023 CLINICAL DATA:  Hip pain, shortness of breath EXAM: CHEST  1 VIEW COMPARISON:  05/18/2016 FINDINGS: Coarse airspace opacities laterally in the right mid lung, new since previous. Prominent alveolar opacities in the lung bases left greater than right, increased since previous. Heart size and mediastinal contours are within  normal limits. Aortic Atherosclerosis (ICD10-170.0). No effusion. Visualized bones unremarkable. IMPRESSION: Coarse bilateral airspace opacities as above, increased since 2017 but of indeterminate acuity. Electronically Signed   By: Corlis Leak M.D.   On: 07/01/2023 16:54    Positive ROS: All other systems have been reviewed and were otherwise negative with the exception of those mentioned in the HPI and as above.  Physical Exam: General:  Alert, no acute distress Psychiatric:  Patient is competent for consent with normal mood and affect   Cardiovascular:  No pedal edema Respiratory:  No wheezing, non-labored breathing GI:  Abdomen is soft and non-tender Skin:  No lesions in the area of chief complaint Neurologic:  Sensation intact distally Lymphatic:  No axillary or cervical lymphadenopathy  Orthopedic Exam:  Orthopedic examination is limited to the right hip and lower extremity.  He is right lower extremity appears to be appropriately aligned and essentially the same length as his left.  Skin inspection  around the right hip is unremarkable.  No swelling, erythema, ecchymosis, abrasions, or other skin abnormalities are identified.  He has mild tenderness palpation over the anterolateral lateral aspects of the right hip.  He has more severe pain with any attempted active or passive motion of the hip.  He is grossly neurovascularly intact to the right lower extremity and foot.  X-rays:  X-rays of the pelvis and right hip are available for review and have been reviewed by myself.  The findings are as described above.  There is a valgus impacted right femoral neck fracture with excellent alignment in both the AP and lateral projections.  Minimal degenerative changes of the right hip joint are noted.  Assessment: Valgus impacted right femoral neck fracture.  Plan: The treatment options have been discussed with the patient and his son, who is at the bedside, including both surgical and nonsurgical options.  Given that the patient's hip pain has been worsening over the past few days, I do feel it is reasonable to consider surgical intervention, specifically an in situ cannulated screw fixation of the valgus impacted right femoral neck fracture.  This procedure was discussed in detail, as were the potential risks (including bleeding, infection, nerve and blood vessel injury, persistent or recurrent pain, stiffness of the hip, malunion and/or nonunion, leg length inequality, need for further surgery, blood clots, strokes, heart attacks and/or arrhythmias, etc.) and benefits.  The patient states understanding and wishes to proceed.  A formal written consent will be obtained by the nursing staff.  Thank you for asking me to participate in the care of this most pleasant and unfortunate man.  I will be happy to follow him with you.   Maryagnes Amos, MD  Beeper #:  [phone]  07/01/2023 5:41 PM

## 2023-07-01 NOTE — Assessment & Plan Note (Signed)
-   Hold home Eliquis in the setting of surgery tomorrow

## 2023-07-01 NOTE — Assessment & Plan Note (Addendum)
Per chart review, patient had a CT of the chest in 2021 that demonstrated patchy areas of GGO, septal thickening, traction bronchiectasis; categorized as probable UIP.  Overall, likely IPF, however will require formal workup by pulmonology. On personal review of CT today, fibrotic changes seem to have mildly progressed.   - Management of shortness of breath as noted above - Will need outpatient referral to pulmonology if this is in line with patient's goals of care

## 2023-07-01 NOTE — Assessment & Plan Note (Signed)
Continue home antihypertensives 

## 2023-07-01 NOTE — ED Provider Notes (Signed)
[facility] Provider Note    Event Date/Time   First MD Initiated Contact with Patient 07/01/23 1523     (approximate)   History   Chief Complaint: Hip Injury and Shortness of Breath   HPI  Michael Gilbert is a 89 y.o. male with a history of CKD, hypertension, atrial flutter on Eliquis who is sent to the ED for right hip fracture identified on outpatient imaging.  Patient reports that he had a fall 2 weeks ago and has had hip pain since.  He uses a walker and has continued ambulating but finds it very painful to bear weight on the right leg.  Also over the last 3 to 4 days he has been having shortness of breath, worse with exertion.  Denies chest pain, cough, or fever.     Physical Exam   Triage Vital Signs: ED Triage Vitals [07/01/23 1510]  Encounter Vitals Group     BP (!) 148/83     Systolic BP Percentile      Diastolic BP Percentile      Pulse Rate 60     Resp 18     Temp 98.4 F (36.9 C)     Temp src      SpO2 96 %     Weight 161 lb (73 kg)     Height 5\' 11"  (1.803 m)     Head Circumference      Peak Flow      Pain Score 4     Pain Loc      Pain Education      Exclude from Growth Chart     Most recent vital signs: Vitals:   07/01/23 1600 07/01/23 1615  BP: (!) 140/73   Pulse: 61 60  Resp: 20   Temp:    SpO2: 96% 98%    General: Awake, no distress.  CV:  Good peripheral perfusion.  Regular rate and rhythm, normal distal pulses Resp:  Normal effort.  Clear to auscultation bilaterally Abd:  No distention.  Soft nontender Other:  Slight shortening of the right lower extremity compared to left.  There is tenderness at the right greater trochanter and pain with passive hip flexion.   ED Results / Procedures / Treatments   Labs (all labs ordered are listed, but only abnormal results are displayed) Labs Reviewed  CBC - Abnormal; Notable for the following components:      Result Value   Hemoglobin 12.6 (*)    HCT 38.4 (*)    All  other components within normal limits  BASIC METABOLIC PANEL - Abnormal; Notable for the following components:   Sodium 132 (*)    CO2 20 (*)    Glucose, Bld 101 (*)    BUN 45 (*)    Creatinine, Ser 1.75 (*)    GFR, Estimated 36 (*)    All other components within normal limits  D-DIMER, QUANTITATIVE - Abnormal; Notable for the following components:   D-Dimer, Quant 2.61 (*)    All other components within normal limits  CBC  BASIC METABOLIC PANEL  TYPE AND SCREEN  TROPONIN I (HIGH SENSITIVITY)  TROPONIN I (HIGH SENSITIVITY)     EKG Interpreted by me Atrial flutter, paced ventricular rhythm at a rate of 60.  Axis, no acute ischemic changes   RADIOLOGY Chest x-ray interpreted by me, appears unremarkable.  Radiology report reviewed Right hip x-ray interpreted by me, shows femoral neck fracture, radiology report reviewed.  CT angiogram chest pending  PROCEDURES:  Procedures   MEDICATIONS ORDERED IN ED: Medications  ceFAZolin (ANCEF) IVPB 2g/100 mL premix (has no administration in time range)  HYDROmorphone (DILAUDID) injection 0.5 mg (has no administration in time range)  oxyCODONE (Oxy IR/ROXICODONE) immediate release tablet 5-10 mg (has no administration in time range)  polyethylene glycol (MIRALAX / GLYCOLAX) packet 17 g (has no administration in time range)  lactated ringers bolus 1,000 mL (1,000 mLs Intravenous New Bag/Given 07/01/23 1802)  iohexol (OMNIPAQUE) 350 MG/ML injection 60 mL (60 mLs Intravenous Contrast Given 07/01/23 1706)     IMPRESSION / MDM / ASSESSMENT AND PLAN / ED COURSE  I reviewed the triage vital signs and the nursing notes.  DDx: Pneumonia, pleural effusion, pulmonary edema, pneumothorax, AKI, electrolyte abnormality, anemia, pulmonary embolism, non-STEMI, fracture  Patient's presentation is most consistent with acute presentation with potential threat to life or bodily function.  Patient presents with right hip pain, x-ray positive for  femoral neck fracture.  Discussed with orthopedics who will plan surgical management tomorrow.  Will need to admit to hospitalist  Labs overall reassuring except D-dimer elevated at 2.6.  Will obtain CT angiogram to rule out PE.  ----------------------------------------- 6:33 PM on 07/01/2023 ----------------------------------------- Case discussed with hospitalist for further management.  CT chest negative for PE, shows lesion suspicious for neoplasm, consider short-term repeat imaging to monitor.       FINAL CLINICAL IMPRESSION(S) / ED DIAGNOSES   Final diagnoses:  Closed right hip fracture, initial encounter (HCC)  Dyspnea, unspecified type     Rx / DC Orders   ED Discharge Orders     None        Note:  This document was prepared using Dragon voice recognition software and may include unintentional dictation errors.   Sharman Cheek, MD 07/01/23 [phone]

## 2023-07-01 NOTE — ED Notes (Signed)
First Nurse Nurse: Patient to ED from [facility] for hip pain. Confirmed right femur fx. Also having SOB

## 2023-07-01 NOTE — ED Triage Notes (Signed)
Pt to ED from [facility] for right hip fx and shob x1 week. Was seen at [facility] and had imaging

## 2023-07-02 ENCOUNTER — Encounter: Payer: Self-pay | Admitting: Internal Medicine

## 2023-07-02 ENCOUNTER — Encounter
Admission: EM | Disposition: A | Discharge status: Home Health | Payer: Self-pay | Source: Home / Self Care | Attending: Internal Medicine

## 2023-07-02 ENCOUNTER — Inpatient Hospital Stay: Payer: Medicare Other | Admitting: Anesthesiology

## 2023-07-02 ENCOUNTER — Inpatient Hospital Stay: Payer: Medicare Other

## 2023-07-02 ENCOUNTER — Other Ambulatory Visit: Payer: Self-pay

## 2023-07-02 DIAGNOSIS — S72001A Fracture of unspecified part of neck of right femur, initial encounter for closed fracture: Secondary | ICD-10-CM | POA: Diagnosis not present

## 2023-07-02 DIAGNOSIS — J438 Other emphysema: Secondary | ICD-10-CM | POA: Diagnosis not present

## 2023-07-02 DIAGNOSIS — I10 Essential (primary) hypertension: Secondary | ICD-10-CM | POA: Diagnosis not present

## 2023-07-02 DIAGNOSIS — G4733 Obstructive sleep apnea (adult) (pediatric): Secondary | ICD-10-CM | POA: Diagnosis not present

## 2023-07-02 HISTORY — PX: HIP PINNING,CANNULATED: SHX1758

## 2023-07-02 LAB — BASIC METABOLIC PANEL
Anion gap: 6 (ref 5–15)
Anion gap: 11 (ref 5–15)
BUN: 44 mg/dL — ABNORMAL HIGH (ref 8–23)
BUN: 47 mg/dL — ABNORMAL HIGH (ref 8–23)
CO2: 20 mmol/L — ABNORMAL LOW (ref 22–32)
CO2: 20 mmol/L — ABNORMAL LOW (ref 22–32)
Calcium: 8.7 mg/dL — ABNORMAL LOW (ref 8.9–10.3)
Calcium: 8.5 mg/dL — ABNORMAL LOW (ref 8.9–10.3)
Chloride: 106 mmol/L (ref 98–111)
Chloride: 101 mmol/L (ref 98–111)
Creatinine, Ser: 1.52 mg/dL — ABNORMAL HIGH (ref 0.61–1.24)
Creatinine, Ser: 1.7 mg/dL — ABNORMAL HIGH (ref 0.61–1.24)
GFR, Estimated: 42 mL/min — ABNORMAL LOW (ref >60)
GFR, Estimated: 37 mL/min — ABNORMAL LOW (ref >60)
Glucose, Bld: 112 mg/dL — ABNORMAL HIGH (ref 70–99)
Glucose, Bld: 123 mg/dL — ABNORMAL HIGH (ref 70–99)
Potassium: 4.7 mmol/L (ref 3.5–5.1)
Potassium: 4.5 mmol/L (ref 3.5–5.1)
Sodium: 132 mmol/L — ABNORMAL LOW (ref 135–145)
Sodium: 132 mmol/L — ABNORMAL LOW (ref 135–145)

## 2023-07-02 LAB — CBC
HCT: 35.2 % — ABNORMAL LOW (ref 39.0–52.0)
Hemoglobin: 11.8 g/dL — ABNORMAL LOW (ref 13.0–17.0)
MCH: 29.3 pg (ref 26.0–34.0)
MCHC: 33.5 g/dL (ref 30.0–36.0)
MCV: 87.3 fL (ref 80.0–100.0)
Platelets: 239 10*3/uL (ref 150–400)
RBC: 4.03 MIL/uL — ABNORMAL LOW (ref 4.22–5.81)
RDW: 14.6 % (ref 11.5–15.5)
WBC: 6.6 10*3/uL (ref 4.0–10.5)
nRBC: 0 % (ref 0.0–0.2)

## 2023-07-02 LAB — GLUCOSE, CAPILLARY: Glucose-Capillary: 133 mg/dL — ABNORMAL HIGH (ref 70–99)

## 2023-07-02 SURGERY — FIXATION, FEMUR, NECK, PERCUTANEOUS, USING SCREW
Anesthesia: General | Site: Hip | Laterality: Right

## 2023-07-02 MED ORDER — PROPOFOL 10 MG/ML IV BOLUS
INTRAVENOUS | Status: DC | PRN
Start: 2023-07-02 — End: 2023-07-02
  Administered 2023-07-02 (×4): 20 mg via INTRAVENOUS

## 2023-07-02 MED ORDER — LACTATED RINGERS IV SOLN: INTRAVENOUS | Status: DC | PRN

## 2023-07-02 MED ORDER — CHLORHEXIDINE GLUCONATE 0.12 % MT SOLN: 15.0000 mL | Freq: Once | OROMUCOSAL | Status: DC

## 2023-07-02 MED ORDER — ONDANSETRON HCL 4 MG/2ML IJ SOLN: 4.0000 mg | Freq: Four times a day (QID) | INTRAMUSCULAR | Status: DC | PRN

## 2023-07-02 MED ORDER — FENTANYL CITRATE (PF) 100 MCG/2ML IJ SOLN
INTRAMUSCULAR | Status: AC
  Filled 2023-07-02: qty 2

## 2023-07-02 MED ORDER — BISACODYL 10 MG RE SUPP: 10.0000 mg | Freq: Every day | RECTAL | Status: DC | PRN

## 2023-07-02 MED ORDER — EPHEDRINE 5 MG/ML INJ
INTRAVENOUS | Status: AC
  Filled 2023-07-02: qty 5

## 2023-07-02 MED ORDER — DIPHENHYDRAMINE HCL 12.5 MG/5ML PO ELIX: 12.5000 mg | ORAL_SOLUTION | ORAL | Status: DC | PRN

## 2023-07-02 MED ORDER — BUPIVACAINE-EPINEPHRINE (PF) 0.5% -1:200000 IJ SOLN
INTRAMUSCULAR | Status: AC
  Filled 2023-07-02: qty 30

## 2023-07-02 MED ORDER — CEFAZOLIN SODIUM-DEXTROSE 2-4 GM/100ML-% IV SOLN
2.0000 g | Freq: Four times a day (QID) | INTRAVENOUS | Status: AC
  Administered 2023-07-02 – 2023-07-03 (×3): 2 g via INTRAVENOUS
  Filled 2023-07-02 (×3): qty 100

## 2023-07-02 MED ORDER — ONDANSETRON HCL 4 MG/2ML IJ SOLN
INTRAMUSCULAR | Status: AC
  Filled 2023-07-02: qty 2

## 2023-07-02 MED ORDER — ONDANSETRON HCL 4 MG PO TABS: 4.0000 mg | ORAL_TABLET | Freq: Four times a day (QID) | ORAL | Status: DC | PRN

## 2023-07-02 MED ORDER — METOCLOPRAMIDE HCL 5 MG/ML IJ SOLN: 5.0000 mg | Freq: Three times a day (TID) | INTRAMUSCULAR | Status: DC | PRN

## 2023-07-02 MED ORDER — ADULT MULTIVITAMIN W/MINERALS CH
1.0000 | ORAL_TABLET | Freq: Every day | ORAL | Status: DC
  Administered 2023-07-03: 1 via ORAL
  Filled 2023-07-02: qty 1

## 2023-07-02 MED ORDER — ONDANSETRON HCL 4 MG/2ML IJ SOLN
INTRAMUSCULAR | Status: DC | PRN
  Administered 2023-07-02: 4 mg via INTRAVENOUS

## 2023-07-02 MED ORDER — FENTANYL CITRATE (PF) 100 MCG/2ML IJ SOLN: 25.0000 ug | INTRAMUSCULAR | Status: DC | PRN

## 2023-07-02 MED ORDER — DEXAMETHASONE SODIUM PHOSPHATE 10 MG/ML IJ SOLN
INTRAMUSCULAR | Status: AC
  Filled 2023-07-02: qty 1

## 2023-07-02 MED ORDER — ACETAMINOPHEN 325 MG PO TABS: 325.0000 mg | ORAL_TABLET | Freq: Four times a day (QID) | ORAL | Status: DC | PRN

## 2023-07-02 MED ORDER — FLEET ENEMA RE ENEM: 1.0000 | ENEMA | Freq: Once | RECTAL | Status: DC | PRN

## 2023-07-02 MED ORDER — EPHEDRINE SULFATE (PRESSORS) 50 MG/ML IJ SOLN
INTRAMUSCULAR | Status: DC | PRN
  Administered 2023-07-02: 5 mg via INTRAVENOUS
  Administered 2023-07-02 (×2): 10 mg via INTRAVENOUS

## 2023-07-02 MED ORDER — METOCLOPRAMIDE HCL 5 MG PO TABS: 5.0000 mg | ORAL_TABLET | Freq: Three times a day (TID) | ORAL | Status: DC | PRN

## 2023-07-02 MED ORDER — ACETAMINOPHEN 500 MG PO TABS
1000.0000 mg | ORAL_TABLET | Freq: Four times a day (QID) | ORAL | Status: AC
  Administered 2023-07-02 – 2023-07-03 (×4): 1000 mg via ORAL
  Filled 2023-07-02 (×4): qty 2

## 2023-07-02 MED ORDER — CEFAZOLIN SODIUM-DEXTROSE 2-3 GM-%(50ML) IV SOLR
INTRAVENOUS | Status: DC | PRN
Start: 2023-07-02 — End: 2023-07-02
  Administered 2023-07-02: 2 g via INTRAVENOUS

## 2023-07-02 MED ORDER — PROPOFOL 1000 MG/100ML IV EMUL
INTRAVENOUS | Status: AC
  Filled 2023-07-02: qty 100

## 2023-07-02 MED ORDER — LIDOCAINE HCL (PF) 2 % IJ SOLN
INTRAMUSCULAR | Status: DC | PRN
  Administered 2023-07-02: 40 mg via INTRADERMAL

## 2023-07-02 MED ORDER — CEFAZOLIN SODIUM 1 G IJ SOLR
INTRAMUSCULAR | Status: AC
  Filled 2023-07-02: qty 20

## 2023-07-02 MED ORDER — 0.9 % SODIUM CHLORIDE (POUR BTL) OPTIME
TOPICAL | Status: DC | PRN
  Administered 2023-07-02: 100 mL

## 2023-07-02 MED ORDER — SODIUM CHLORIDE 0.9 % IV SOLN: INTRAVENOUS | Status: DC

## 2023-07-02 MED ORDER — ENSURE ENLIVE PO LIQD: 237.0000 mL | Freq: Two times a day (BID) | ORAL | Status: DC

## 2023-07-02 MED ORDER — BUPIVACAINE-EPINEPHRINE (PF) 0.5% -1:200000 IJ SOLN
INTRAMUSCULAR | Status: DC | PRN
  Administered 2023-07-02: 20 mL via PERINEURAL

## 2023-07-02 MED ORDER — FENTANYL CITRATE (PF) 100 MCG/2ML IJ SOLN
INTRAMUSCULAR | Status: DC | PRN
  Administered 2023-07-02 (×4): 25 ug via INTRAVENOUS

## 2023-07-02 MED ORDER — MAGNESIUM HYDROXIDE 400 MG/5ML PO SUSP
30.0000 mL | Freq: Every day | ORAL | Status: DC | PRN
  Administered 2023-07-02: 30 mL via ORAL
  Filled 2023-07-02: qty 30

## 2023-07-02 MED ORDER — PROPOFOL 10 MG/ML IV BOLUS
INTRAVENOUS | Status: AC
  Filled 2023-07-02: qty 20

## 2023-07-02 MED ORDER — DEXAMETHASONE SODIUM PHOSPHATE 10 MG/ML IJ SOLN
INTRAMUSCULAR | Status: DC | PRN
  Administered 2023-07-02: 5 mg via INTRAVENOUS

## 2023-07-02 MED ORDER — PROPOFOL 500 MG/50ML IV EMUL
INTRAVENOUS | Status: DC | PRN
  Administered 2023-07-02: 50 ug/kg/min via INTRAVENOUS

## 2023-07-02 MED ORDER — APIXABAN 2.5 MG PO TABS
2.5000 mg | ORAL_TABLET | Freq: Two times a day (BID) | ORAL | Status: DC
  Administered 2023-07-03: 2.5 mg via ORAL
  Filled 2023-07-02: qty 1

## 2023-07-02 MED ORDER — DOCUSATE SODIUM 100 MG PO CAPS
100.0000 mg | ORAL_CAPSULE | Freq: Two times a day (BID) | ORAL | Status: DC
  Administered 2023-07-02 – 2023-07-03 (×2): 100 mg via ORAL
  Filled 2023-07-02 (×2): qty 1

## 2023-07-02 SURGICAL SUPPLY — 39 items
APL PRP STRL LF DISP 70% ISPRP (MISCELLANEOUS) ×3
BIT DRILL 4.8X300 (BIT) IMPLANT
BNDG CMPR 5X4 CHSV STRCH STRL (GAUZE/BANDAGES/DRESSINGS) ×1
BNDG CMPR 5X6 CHSV STRCH STRL (GAUZE/BANDAGES/DRESSINGS) ×1
BNDG COHESIVE 4X5 TAN STRL LF (GAUZE/BANDAGES/DRESSINGS) ×1 IMPLANT
BNDG COHESIVE 6X5 TAN ST LF (GAUZE/BANDAGES/DRESSINGS) ×1 IMPLANT
CHLORAPREP W/TINT 26 (MISCELLANEOUS) ×2 IMPLANT
DRSG OPSITE POSTOP 3X4 (GAUZE/BANDAGES/DRESSINGS) ×1 IMPLANT
DRSG XEROFORM 1X8 (GAUZE/BANDAGES/DRESSINGS) IMPLANT
ELECT REM PT RETURN 9FT ADLT (ELECTROSURGICAL) ×1
ELECTRODE REM PT RTRN 9FT ADLT (ELECTROSURGICAL) ×1 IMPLANT
GLOVE BIO SURGEON STRL SZ8 (GLOVE) ×2 IMPLANT
GLOVE INDICATOR 8.0 STRL GRN (GLOVE) ×1 IMPLANT
GOWN STRL REUS W/ TWL LRG LVL3 (GOWN DISPOSABLE) ×1 IMPLANT
GOWN STRL REUS W/ TWL XL LVL3 (GOWN DISPOSABLE) ×1 IMPLANT
GOWN STRL REUS W/TWL LRG LVL3 (GOWN DISPOSABLE) ×1
GOWN STRL REUS W/TWL XL LVL3 (GOWN DISPOSABLE) ×1
HANDLE YANKAUER SUCT OPEN TIP (MISCELLANEOUS) ×1 IMPLANT
MANIFOLD NEPTUNE II (INSTRUMENTS) ×1 IMPLANT
NDL FILTER BLUNT 18X1 1/2 (NEEDLE) ×1 IMPLANT
NDL HYPO 22X1.5 SAFETY MO (MISCELLANEOUS) ×1 IMPLANT
NEEDLE FILTER BLUNT 18X1 1/2 (NEEDLE) ×1 IMPLANT
NEEDLE HYPO 22X1.5 SAFETY MO (MISCELLANEOUS) ×1 IMPLANT
PACK HIP COMPR (MISCELLANEOUS) ×1 IMPLANT
PIN GUIDE DRIL TIP 2.8X300 STE (PIN) IMPLANT
SCREW 6.5 CANNULATED 105X16HIP (Screw) IMPLANT
SCREW CANN 6.5MMX100MMX16MM IMPLANT
SCREW CANN 6.5X110X16 (Screw) IMPLANT
STAPLER SKIN PROX 35W (STAPLE) ×1 IMPLANT
STRAP SAFETY 5IN WIDE (MISCELLANEOUS) ×1 IMPLANT
SUT PROLENE 2 0 FS (SUTURE) IMPLANT
SUT VIC AB 0 CT1 36 (SUTURE) ×1 IMPLANT
SUT VIC AB 0 SH 27 (SUTURE) IMPLANT
SUT VIC AB 2-0 CT1 27 (SUTURE) ×1
SUT VIC AB 2-0 CT1 TAPERPNT 27 (SUTURE) ×1 IMPLANT
SYR 20ML LL LF (SYRINGE) ×1 IMPLANT
SYR 5ML LL (SYRINGE) ×1 IMPLANT
TRAP FLUID SMOKE EVACUATOR (MISCELLANEOUS) ×1 IMPLANT
WATER STERILE IRR 500ML POUR (IV SOLUTION) ×1 IMPLANT

## 2023-07-02 NOTE — Transfer of Care (Signed)
Immediate Anesthesia Transfer of Care Note  Patient: Michael Gilbert  Procedure(s) Performed: PERCUTANEOUS FIXATION OF FEMORAL NECK (Right: Hip)  Patient Location: PACU  Anesthesia Type:General  Level of Consciousness: drowsy  Airway & Oxygen Therapy: Patient Spontanous Breathing and Patient connected to nasal cannula oxygen  Post-op Assessment: Report given to RN and Post -op Vital signs reviewed and stable  Post vital signs: Reviewed and stable  Last Vitals:  Vitals Value Taken Time  BP 110/71 07/02/23 1445  Temp    Pulse 65 07/02/23 1447  Resp 12 07/02/23 1447  SpO2 100 % 07/02/23 1447  Vitals shown include unfiled device data.  Last Pain:  Vitals:   07/02/23 0834  PainSc: 2          Complications: No notable events documented.

## 2023-07-02 NOTE — Op Note (Signed)
07/02/2023  2:48 PM  Patient:   Michael Gilbert  Pre-Op Diagnosis:   Valgus-impacted right femoral neck fracture.  Post-Op Diagnosis:   Same.  Procedure:   In situ cannulated screw fixation of valgus-impacted right femoral neck fracture.  Surgeon:   Maryagnes Amos, MD  Assistant:   None  Anesthesia:   Local with IV sedation  Findings:   As above.  Complications:   None  EBL:   20 cc  Fluids:   400 cc crystalloid  UOP:   None  TT:   None  Drains:   None  Closure:   Staples  Implants:   Biomet 6.5 mm cannulated screws (16 mm) 3  Brief Clinical Note:   The patient is a 89 year old male who sustained the above-noted injury about 10 days ago.  Because of continued/worsening pain, the patient finally decided to seek care. X-rays in the emergency room demonstrated the above-noted findings. The patient has been cleared medically and presents at this time for definitive management of this injury.  Procedure:   The patient was brought into the operating room. After adequate IV sedation was obtained, the patient was lain in the supine position on the fracture table. The uninjured leg was placed in a flexed and abducted position over the well-leg holder while the operative leg was placed in gentle longitudinal traction with some internal rotation. The adequacy of fracture position was verified fluoroscopically in AP and lateral projections before the lateral aspect of the right hip and thigh were prepped with ChloraPrep solution and draped sterilely. Preoperative antibiotics were administered. A timeout was performed to verify the appropriate surgical site.   After infiltrating the expected incision site and deeper tissues with 20 cc of half percent Sensorcaine with epinephrine, an approximately 3-4 cm incision was made over the lateral aspect of the lower part of the greater trochanter as verified fluoroscopically. This incision was carried down through the subcutaneous cutaneous tissues  to expose the iliotibial band. This was split the length the incision to expose the lateral aspect of the proximal femur at the level of the inferior most part of the greater trochanter. Under fluoroscopic guidance, a guide wire was drilled up through the femoral neck along the calcar into the femoral head to rest within 5 mm of subchondral bone. Its position was assessed fluoroscopically in AP and lateral projections and found to be excellent. Two additional guide wires were placed in parallel fashion more proximally, anterior and posterior to the original pin in an inverted triangular configuration. Again the position of these pins was verified fluoroscopically in AP, lateral, and oblique projections and found to be excellent. The length of each of these pins was measured before each pin was overdrilled with the appropriate cannulated drill. Each of these screws was inserted and advanced to within 8-10 mm of subchondral bone. Again the position of each of these screws was assessed fluoroscopically in AP, lateral, and oblique projections, and found to be excellent.  One screw was felt to be slightly long so it was replaced with a slightly shorter screw.  Repeat imaging again demonstrated excellent position of the hardware.  The wound was copiously irrigated with sterile saline solution before the IT band was reapproximated using #0 Vicryl interrupted sutures. The subcutaneous tissues also were closed using 2-0 Vicryl interrupted sutures before the skin was closed using staples. A sterile occlusive dressing was applied to the wound. The patient was awakened and transferred back to his hospital bed before being returned  to the recovery room in satisfactory condition after tolerating the procedure well.

## 2023-07-02 NOTE — Anesthesia Preprocedure Evaluation (Signed)
Anesthesia Evaluation  Patient identified by MRN, date of birth, ID band Patient awake    Reviewed: Allergy & Precautions, H&P , NPO status , Patient's Chart, lab work & pertinent test results, reviewed documented beta blocker date and time   History of Anesthesia Complications Negative for: history of anesthetic complications  Airway Mallampati: III  TM Distance: >3 FB Neck ROM: full    Dental  (+) Dental Advidsory Given, Upper Dentures, Lower Dentures   Pulmonary neg shortness of breath, sleep apnea and Continuous Positive Airway Pressure Ventilation , neg COPD, neg recent URI, former smoker          Cardiovascular Exercise Tolerance: Good hypertension, (-) angina (-) CAD, (-) Past MI, (-) Cardiac Stents and (-) CABG + dysrhythmias + pacemaker (-) Valvular Problems/Murmurs     Neuro/Psych negative neurological ROS  negative psych ROS   GI/Hepatic Neg liver ROS,GERD  ,,  Endo/Other  negative endocrine ROS    Renal/GU CRFRenal disease  negative genitourinary   Musculoskeletal   Abdominal   Peds  Hematology negative hematology ROS (+)   Anesthesia Other Findings Past Medical History: No date: Allergy No date: Bradycardia No date: Colorectal polyps No date: Diverticulosis No date: Hypertension   Reproductive/Obstetrics negative OB ROS                             Anesthesia Physical Anesthesia Plan  ASA: 3  Anesthesia Plan: General   Post-op Pain Management:    Induction: Intravenous  PONV Risk Score and Plan: 2 and Propofol infusion and TIVA  Airway Management Planned: Natural Airway and Simple Face Mask  Additional Equipment:   Intra-op Plan:   Post-operative Plan:   Informed Consent: I have reviewed the patients History and Physical, chart, labs and discussed the procedure including the risks, benefits and alternatives for the proposed anesthesia with the patient or  authorized representative who has indicated his/her understanding and acceptance.     Dental Advisory Given  Plan Discussed with: Anesthesiologist, CRNA and Surgeon  Anesthesia Plan Comments:         Anesthesia Quick Evaluation

## 2023-07-02 NOTE — Plan of Care (Signed)
  Problem: Education: Goal: Knowledge of General Education information will improve Description: Including pain rating scale, medication(s)/side effects and non-pharmacologic comfort measures Outcome: Progressing   Problem: Health Behavior/Discharge Planning: Goal: Ability to manage health-related needs will improve Outcome: Progressing   Problem: Clinical Measurements: Goal: Will remain free from infection Outcome: Progressing Goal: Diagnostic test results will improve Outcome: Progressing Goal: Respiratory complications will improve Outcome: Progressing Goal: Cardiovascular complication will be avoided Outcome: Progressing   Problem: Activity: Goal: Risk for activity intolerance will decrease Outcome: Progressing   Problem: Nutrition: Goal: Adequate nutrition will be maintained Outcome: Progressing   Problem: Elimination: Goal: Will not experience complications related to bowel motility Outcome: Progressing Goal: Will not experience complications related to urinary retention Outcome: Progressing   Problem: Pain Managment: Goal: General experience of comfort will improve Outcome: Progressing   Problem: Safety: Goal: Ability to remain free from injury will improve Outcome: Progressing

## 2023-07-02 NOTE — Plan of Care (Signed)

## 2023-07-02 NOTE — Progress Notes (Signed)
Initial Nutrition Assessment  DOCUMENTATION CODES:   Not applicable  INTERVENTION:   -Once diet is advanced, add:   -Ensure Enlive po BID, each supplement provides 350 kcal and 20 grams of protein.  -MVI with minerals daily  NUTRITION DIAGNOSIS:   Increased nutrient needs related to post-op healing as evidenced by estimated needs.  GOAL:   Patient will meet greater than or equal to 90% of their needs  MONITOR:   PO intake, Supplement acceptance, Diet advancement  REASON FOR ASSESSMENT:   Malnutrition Screening Tool    ASSESSMENT:   Pt with medical history significant of atrial fibrillation/flutter on Eliquis, hypertension, CKD stage IIIb, OSA, sick sinus syndrome s/p PPM, who presents due to hip pain and shortness of breath.  Pt admitted with rt femur fracture.   Reviewed I/O's: -350 ml x 24 hours  UOP: 350 ml x 24 hours  Per orthopedics notes, plan surgery today. Pt currently NPO for procedure.   Spoke with pt at bedside, who was pleasant and in good spirits today. He explains that he has had hip pain over the past week secondary to fall after attempting to kill a spider in his house. Pt shares that he went to PCP after the fall initially, but x-rays were not performed. Pt reports he usually has a good appetite (2-3 meals per day), but has been eating less over the past week secondary to pain.   Reviewed wt hx; pt has experienced a 3.1% wt loss over the past month. Also noted distant history of weight loss in the past.   Discussed importance of good meal and supplement intake to promote healing. Pt amenable to supplements.   Medications reviewed and include prednisone.   Labs reviewed: Na: 132, CBGS: 83 (inpatient orders for glycemic control are none).    NUTRITION - FOCUSED PHYSICAL EXAM:  Flowsheet Row Most Recent Value  Orbital Region No depletion  Upper Arm Region Mild depletion  Thoracic and Lumbar Region No depletion  Buccal Region No depletion   Temple Region Mild depletion  Clavicle Bone Region Mild depletion  Clavicle and Acromion Bone Region Mild depletion  Scapular Bone Region Mild depletion  Dorsal Hand Mild depletion  Patellar Region Moderate depletion  Anterior Thigh Region Moderate depletion  Posterior Calf Region Moderate depletion  Edema (RD Assessment) None  Hair Reviewed  Eyes Reviewed  Mouth Reviewed  Skin Reviewed  Nails Reviewed       Diet Order:   Diet Order             Diet NPO time specified  Diet effective midnight                   EDUCATION NEEDS:   Education needs have been addressed  Skin:  Skin Assessment: Reviewed RN Assessment  Last BM:  Unknown  Height:   Ht Readings from Last 1 Encounters:  07/01/23 5\' 11"  (1.803 m)    Weight:   Wt Readings from Last 1 Encounters:  07/01/23 73 kg    Ideal Body Weight:  78.2 kg  BMI:  Body mass index is 22.45 kg/m.  Estimated Nutritional Needs:   Kcal:  1800-2000  Protein:  90-105 grams  Fluid:  > 1.8 L    Levada Schilling, RD, LDN, CDCES Registered Dietitian II Certified Diabetes Care and Education Specialist Please refer to [facility] for RD and/or RD on-call/weekend/after hours pager

## 2023-07-02 NOTE — Progress Notes (Addendum)
1      PROGRESS NOTE    Michael Gilbert  ZOX:[id number] DOB: 1927/12/26 DOA: 07/01/2023 PCP: [name], MD    Brief Narrative:   89 y.o. male with medical history significant of atrial fibrillation/flutter on Eliquis, hypertension, CKD stage IIIb, OSA, sick sinus syndrome s/p PPM admitted for right femoral neck fracture  8/28:  in situ cannulated screw fixation of valgus-impacted right femoral neck fracture, palliative care consult  Assessment & Plan:   Principal Problem:   Right femoral fracture (HCC) Active Problems:   Shortness of breath   Interstitial lung disease (HCC)   COPD (chronic obstructive pulmonary disease) (HCC)   Atrial flutter (HCC)   CKD (chronic kidney disease) stage 3, GFR 30-59 ml/min (HCC)   HTN (hypertension), benign   OSA (obstructive sleep apnea)   Hyponatremia   Lung nodule  * Right femoral fracture (HCC) Patient is coming in after a fall approximately 8 days ago with x-ray demonstrating an impacted right femoral neck fracture.   Status post in situ cannulated screw fixation of valgus-impacted right femoral neck fracture on 8/28.  - Pain control with oxycodone and Dilaudid - Nonweightbearing till postop - PT and OT starting tomorrow   Shortness of breath likely due to underlying interstitial lung disease COPD Patient endorses significant dyspnea on exertion that has been occurring over the last few days.  CTA is negative for PE.  He has a history of underlying COPD, in addition to ILD that was first noted in 2021 but has not been worked up. No evidence of pneumonia.  No evidence of hypervolemia on examination to suggest CHF exacerbation.  Will treat as ILD versus COPD exacerbation.   - Continuous pulse oximetry - Continue DuoNebs every 6 hours - Continue prednisone 40 mg daily - patient had a CT of the chest in 2021 that demonstrated patchy areas of GGO, septal thickening, traction bronchiectasis; categorized as probable UIP.  Overall, likely IPF,  however will require formal workup by pulmonology. On personal review of CT today, fibrotic changes seem to have mildly progressed.  -Will need outpatient referral to pulmonology if this is in line with patient's goals of care.  Will consult palliative care for goals of care conversation - History of mild to moderate emphysema seen on imaging with clinical diagnosis made by PCP.  No PFTs to confirm obstructive pattern.   Atrial flutter (HCC) - On Eliquis    CKD (chronic kidney disease) stage 3a GFR 30-59 ml/min (HCC) Patient's baseline creatinine predominantly between 1.3 and 1.8, currently within baseline range.   - Will continue to monitor renal indices while admitted   HTN (hypertension), benign - Continue home antihypertensives   OSA (obstructive sleep apnea) - CPAP at bedtime   Hyponatremia Chronic hyponatremia dating back to at least 2022.  Stable at this time   Lung nodule Nodular opacity noted in the right upper lobe, that will need repeat imaging in 3 months     DVT prophylaxis: On Eliquis apixaban (ELIQUIS) tablet 2.5 mg Start: 07/03/23 1000 SCDs Start: 07/02/23 1614 Place TED hose Start: 07/02/23 1614 apixaban (ELIQUIS) tablet 2.5 mg     Code Status: DNR Family Communication: (NO "discussed with patient") Disposition Plan: Possible discharge in next 2 to 3 days depending on clinical progress and postop recovery.  Will likely need SNF   Consultants:  Orthopedic  Procedures:   in situ cannulated screw fixation of valgus-impacted right femoral neck fracture on 8/28.   Antimicrobials:  IV Ancef for surgical  prophylaxis x 3 doses   Subjective: Patient seen in PACU.  Denies any new complaints except minimal surgical site pain  Objective: Vitals:   07/02/23 1515 07/02/23 1524 07/02/23 1548 07/02/23 2019  BP: 135/75  (!) 143/78 124/65  Pulse: 80 64 73 70  Resp: 15 16 15 20   Temp:  (!) 97 F (36.1 C) 98 F (36.7 C) (!) 97.5 F (36.4 C)  SpO2: 98% 97% 96%  95%  Weight:      Height:        Intake/Output Summary (Last 24 hours) at 07/02/2023 2121 Last data filed at 07/02/2023 1853 Gross per 24 hour  Intake 605.5 ml  Output 370 ml  Net 235.5 ml   Filed Weights   07/01/23 1510  Weight: 73 kg    Examination:  General exam: Appears calm and comfortable  Respiratory system: Clear to auscultation. Respiratory effort normal. Cardiovascular system: S1 & S2 heard, RRR. No JVD, murmurs, rubs, gallops or clicks. No pedal edema. Gastrointestinal system: Abdomen is nondistended, soft and nontender. No organomegaly or masses felt. Normal bowel sounds heard. Central nervous system: Alert and oriented. No focal neurological deficits. Extremities: Symmetric 5 x 5 power. Skin: Surgical dressing in place on the right leg Psychiatry: Judgement and insight appear normal. Mood & affect appropriate.     Data Reviewed: I have personally reviewed following labs and imaging studies  CBC: Recent Labs  Lab 07/01/23 1512 07/02/23 0328  WBC 7.8 6.6  HGB 12.6* 11.8*  HCT 38.4* 35.2*  MCV 90.4 87.3  PLT 260 239   Basic Metabolic Panel: Recent Labs  Lab 07/01/23 1512 07/02/23 0328  NA 132* 132*  K 4.4 4.7  CL 101 106  CO2 20* 20*  GLUCOSE 101* 112*  BUN 45* 44*  CREATININE 1.75* 1.52*  CALCIUM 9.1 8.7*   GFR: Estimated Creatinine Clearance: 31.4 mL/min (A) (by C-G formula based on SCr of 1.52 mg/dL (H)). Liver Function Tests: No results for input(s): "AST", "ALT", "ALKPHOS", "BILITOT", "PROT", "ALBUMIN" in the last 168 hours. No results for input(s): "LIPASE", "AMYLASE" in the last 168 hours. No results for input(s): "AMMONIA" in the last 168 hours. Coagulation Profile: No results for input(s): "INR", "PROTIME" in the last 168 hours. Cardiac Enzymes: No results for input(s): "CKTOTAL", "CKMB", "CKMBINDEX", "TROPONINI" in the last 168 hours. BNP (last 3 results) No results for input(s): "PROBNP" in the last 8760 hours. HbA1C: No results  for input(s): "HGBA1C" in the last 72 hours. CBG: Recent Labs  Lab 07/02/23 2055  GLUCAP 133*   Lipid Profile: No results for input(s): "CHOL", "HDL", "LDLCALC", "TRIG", "CHOLHDL", "LDLDIRECT" in the last 72 hours. Thyroid Function Tests: No results for input(s): "TSH", "T4TOTAL", "FREET4", "T3FREE", "THYROIDAB" in the last 72 hours. Anemia Panel: No results for input(s): "VITAMINB12", "FOLATE", "FERRITIN", "TIBC", "IRON", "RETICCTPCT" in the last 72 hours. Sepsis Labs: Recent Labs  Lab 07/01/23 1513  PROCALCITON <0.10    No results found for this or any previous visit (from the past 240 hour(s)).       Radiology Studies: DG HIP UNILAT WITH PELVIS 2-3 VIEWS RIGHT  Result Date: 07/02/2023 CLINICAL DATA:  Fracture. EXAM: DG HIP (WITH OR WITHOUT PELVIS) 2-3V RIGHT COMPARISON:  Preoperative imaging FINDINGS: Three fluoroscopic spot views of the right hip obtained in the operating room. 3 screws traverse the right femoral neck fracture. Fluoroscopy time 1 minute 32 seconds. Fluoroscopy dose not provided. IMPRESSION: Intraoperative fluoroscopy during pinning of right femoral neck fracture. Electronically Signed   By:  Narda Rutherford M.D.   On: 07/02/2023 16:12   DG C-Arm 1-60 Min-No Report  Result Date: 07/02/2023 Fluoroscopy was utilized by the requesting physician.  No radiographic interpretation.   CT Angio Chest PE W and/or Wo Contrast  Result Date: 07/01/2023 CLINICAL DATA:  Short of breath, history of right hip fracture, positive D-dimer EXAM: CT ANGIOGRAPHY CHEST WITH CONTRAST TECHNIQUE: Multidetector CT imaging of the chest was performed using the standard protocol during bolus administration of intravenous contrast. Multiplanar CT image reconstructions and MIPs were obtained to evaluate the vascular anatomy. RADIATION DOSE REDUCTION: This exam was performed according to the departmental dose-optimization program which includes automated exposure control, adjustment of the mA  and/or kV according to patient size and/or use of iterative reconstruction technique. CONTRAST:  60mL OMNIPAQUE IOHEXOL 350 MG/ML SOLN COMPARISON:  07/01/2023, 09/05/2020 FINDINGS: Cardiovascular: This is a technically adequate evaluation of the pulmonary vasculature. No filling defects or pulmonary emboli. The heart is enlarged without pericardial effusion. Lead less pacemaker identified within the right ventricular apex. Normal caliber of the thoracic aorta. Atherosclerosis of the aorta and coronary vasculature. Mediastinum/Nodes: No enlarged mediastinal, hilar, or axillary lymph nodes. Thyroid gland, trachea, and esophagus demonstrate no significant findings. Lungs/Pleura: Upper lobe predominant centrilobular emphysema. Progressive areas of bibasilar scarring and fibrosis. Progressive bilateral bronchiectasis. The central airways are patent. Nodular consolidation within the right upper lobe abuts the minor fissure, measuring 1.6 x 1.5 x 1.7 cm, reference image 76/6. This is new since prior study. No other acute airspace disease, effusion, or pneumothorax. Upper Abdomen: No acute abnormality. Musculoskeletal: No acute or destructive bony abnormalities. Reconstructed images demonstrate no additional findings. Review of the MIP images confirms the above findings. IMPRESSION: 1. No evidence of pulmonary embolus. 2. Nodular right upper lobe consolidation abutting the minor fissure, new since prior study. Three-month follow-up CT is recommended to assess for resolution. If this persists, follow-up PET CT could be considered at that time if the patient would be a therapy candidate should neoplasm be detected. 3. Aortic Atherosclerosis (ICD10-I70.0) and Emphysema (ICD10-J43.9). 4. Cardiomegaly, with coronary artery atherosclerosis. Electronically Signed   By: Sharlet Salina M.D.   On: 07/01/2023 18:13   DG Hip Unilat W or Wo Pelvis 2-3 Views Right  Result Date: 07/01/2023 CLINICAL DATA:  sob, right hip pain EXAM: DG  HIP (WITH OR WITHOUT PELVIS) 2-3V RIGHT COMPARISON:  09/05/2020 FINDINGS: Impacted mid cervical fracture of the right femoral neck. No dislocation. Bony pelvis intact. Degenerative disc disease L4-5. IMPRESSION: Impacted right femoral neck fracture. Electronically Signed   By: Corlis Leak M.D.   On: 07/01/2023 16:56   DG Chest 1 View  Result Date: 07/01/2023 CLINICAL DATA:  Hip pain, shortness of breath EXAM: CHEST  1 VIEW COMPARISON:  05/18/2016 FINDINGS: Coarse airspace opacities laterally in the right mid lung, new since previous. Prominent alveolar opacities in the lung bases left greater than right, increased since previous. Heart size and mediastinal contours are within normal limits. Aortic Atherosclerosis (ICD10-170.0). No effusion. Visualized bones unremarkable. IMPRESSION: Coarse bilateral airspace opacities as above, increased since 2017 but of indeterminate acuity. Electronically Signed   By: Corlis Leak M.D.   On: 07/01/2023 16:54        Scheduled Meds:  acetaminophen  1,000 mg Oral Q6H   allopurinol  100 mg Oral Q supper   [START ON 07/03/2023] apixaban  2.5 mg Oral BID   buPROPion  150 mg Oral Daily   docusate sodium  100 mg Oral BID   [  START ON 07/03/2023] feeding supplement  237 mL Oral BID BM   ipratropium-albuterol  3 mL Nebulization Q6H   [START ON 07/03/2023] losartan  25 mg Oral Daily   metoprolol succinate  12.5 mg Oral Daily   [START ON 07/03/2023] multivitamin with minerals  1 tablet Oral Daily   pantoprazole  40 mg Oral Daily   predniSONE  40 mg Oral Q breakfast   tamsulosin  0.4 mg Oral BID   Continuous Infusions:  sodium chloride 75 mL/hr at 07/02/23 1649    ceFAZolin (ANCEF) IV 2 g (07/02/23 1814)     LOS: 1 day    Time spent: 25 minutes    Lizzet Hendley Sherryll Burger, MD [facility] Pager [phone]  If 7PM-7AM, please contact night-coverage [email or web address]  07/02/2023, 9:21 PM

## 2023-07-03 ENCOUNTER — Encounter: Payer: Self-pay | Admitting: Surgery

## 2023-07-03 DIAGNOSIS — R0602 Shortness of breath: Secondary | ICD-10-CM | POA: Diagnosis not present

## 2023-07-03 DIAGNOSIS — S72034A Nondisplaced midcervical fracture of right femur, initial encounter for closed fracture: Secondary | ICD-10-CM | POA: Insufficient documentation

## 2023-07-03 DIAGNOSIS — J438 Other emphysema: Secondary | ICD-10-CM | POA: Diagnosis not present

## 2023-07-03 DIAGNOSIS — S72001A Fracture of unspecified part of neck of right femur, initial encounter for closed fracture: Secondary | ICD-10-CM | POA: Diagnosis not present

## 2023-07-03 DIAGNOSIS — J849 Interstitial pulmonary disease, unspecified: Secondary | ICD-10-CM | POA: Diagnosis not present

## 2023-07-03 LAB — CBC
HCT: 32.2 % — ABNORMAL LOW (ref 39.0–52.0)
Hemoglobin: 10.9 g/dL — ABNORMAL LOW (ref 13.0–17.0)
MCH: 29.5 pg (ref 26.0–34.0)
MCHC: 33.9 g/dL (ref 30.0–36.0)
MCV: 87.3 fL (ref 80.0–100.0)
Platelets: 243 10*3/uL (ref 150–400)
RBC: 3.69 MIL/uL — ABNORMAL LOW (ref 4.22–5.81)
RDW: 14.7 % (ref 11.5–15.5)
WBC: 9.9 10*3/uL (ref 4.0–10.5)
nRBC: 0 % (ref 0.0–0.2)

## 2023-07-03 MED ORDER — DOCUSATE SODIUM 100 MG PO CAPS: 100.0000 mg | ORAL_CAPSULE | Freq: Two times a day (BID) | ORAL | 0 refills | Status: AC

## 2023-07-03 MED ORDER — OXYCODONE HCL 5 MG PO TABS: 5.0000 mg | ORAL_TABLET | Freq: Four times a day (QID) | ORAL | 0 refills | Status: DC | PRN

## 2023-07-03 MED ORDER — ALBUTEROL SULFATE HFA 108 (90 BASE) MCG/ACT IN AERS: 2.0000 | INHALATION_SPRAY | Freq: Four times a day (QID) | RESPIRATORY_TRACT | 2 refills | Status: AC | PRN

## 2023-07-03 MED ORDER — IPRATROPIUM-ALBUTEROL 0.5-2.5 (3) MG/3ML IN SOLN
3.0000 mL | Freq: Two times a day (BID) | RESPIRATORY_TRACT | Status: DC
  Administered 2023-07-03: 3 mL via RESPIRATORY_TRACT
  Filled 2023-07-03: qty 3

## 2023-07-03 MED ORDER — IPRATROPIUM-ALBUTEROL 0.5-2.5 (3) MG/3ML IN SOLN
3.0000 mL | Freq: Two times a day (BID) | RESPIRATORY_TRACT | Status: DC
Start: 2023-07-03 — End: 2023-07-03

## 2023-07-03 MED ORDER — IPRATROPIUM-ALBUTEROL 0.5-2.5 (3) MG/3ML IN SOLN: 3.0000 mL | Freq: Four times a day (QID) | RESPIRATORY_TRACT | Status: DC | PRN

## 2023-07-03 MED ORDER — PREDNISONE 10 MG PO TABS: 30.0000 mg | ORAL_TABLET | Freq: Every day | ORAL | 0 refills | Status: AC

## 2023-07-03 NOTE — Plan of Care (Signed)
  Problem: Education: Goal: Knowledge of General Education information will improve Description Including pain rating scale, medication(s)/side effects and non-pharmacologic comfort measures Outcome: Progressing   Problem: Health Behavior/Discharge Planning: Goal: Ability to manage health-related needs will improve Outcome: Progressing   Problem: Clinical Measurements: Goal: Ability to maintain clinical measurements within normal limits will improve Outcome: Progressing Goal: Will remain free from infection Outcome: Progressing Goal: Diagnostic test results will improve Outcome: Progressing Goal: Respiratory complications will improve Outcome: Progressing Goal: Cardiovascular complication will be avoided Outcome: Progressing   Problem: Activity: Goal: Risk for activity intolerance will decrease Outcome: Progressing   Problem: Nutrition: Goal: Adequate nutrition will be maintained Outcome: Progressing   Problem: Elimination: Goal: Will not experience complications related to bowel motility Outcome: Progressing Goal: Will not experience complications related to urinary retention Outcome: Progressing   Problem: Pain Managment: Goal: General experience of comfort will improve Outcome: Progressing   

## 2023-07-03 NOTE — Progress Notes (Signed)
  Subjective: 1 Day Post-Op Procedure(s) (LRB): PERCUTANEOUS FIXATION OF FEMORAL NECK (Right) Patient reports pain as mild.   Patient is well, and has had no acute complaints or problems PT and care management to assist with discharge planning.  Patient lives at [place] with his wife. Negative for chest pain and shortness of breath Fever: no Gastrointestinal:Negative for nausea and vomiting  Objective: Vital signs in last 24 hours: Temp:  [97 F (36.1 C)-98 F (36.7 C)] 97.7 F (36.5 C) (08/28 2318) Pulse Rate:  [59-85] 59 (08/28 2318) Resp:  [12-20] 18 (08/28 2318) BP: (107-155)/(65-94) 107/65 (08/28 2318) SpO2:  [94 %-100 %] 98 % (08/28 2318) FiO2 (%):  [21 %] 21 % (08/28 2145)  Intake/Output from previous day:  Intake/Output Summary (Last 24 hours) at 07/03/2023 1027 Last data filed at 07/03/2023 0308 Gross per 24 hour  Intake 1101.07 ml  Output 220 ml  Net 881.07 ml    Intake/Output this shift: No intake/output data recorded.  Labs: Recent Labs    07/01/23 1512 07/02/23 0328 07/03/23 0336  HGB 12.6* 11.8* 10.9*   Recent Labs    07/02/23 0328 07/03/23 0336  WBC 6.6 9.9  RBC 4.03* 3.69*  HCT 35.2* 32.2*  PLT 239 243   Recent Labs    07/02/23 0328 07/02/23 2205  NA 132* 132*  K 4.7 4.5  CL 106 101  CO2 20* 20*  BUN 44* 47*  CREATININE 1.52* 1.70*  GLUCOSE 112* 123*  CALCIUM 8.7* 8.5*   No results for input(s): "LABPT", "INR" in the last 72 hours.  EXAM General - Patient is Alert, Appropriate, and Oriented Extremity - ABD soft Neurovascular intact Dorsiflexion/Plantar flexion intact Incision: dressing C/D/I No cellulitis present Compartment soft Dressing/Incision - Honeycomb intact to the right hip. Motor Function - intact, moving foot and toes well on exam.  Abdomen soft with intact bowel sounds this morning.  Past Medical History:  Diagnosis Date   Allergy    Benign essential hypertension 06/23/2014   Bradycardia    Colorectal  polyps    Diverticulosis    GERD (gastroesophageal reflux disease)    Hypertension    Assessment/Plan: 1 Day Post-Op Procedure(s) (LRB): PERCUTANEOUS FIXATION OF FEMORAL NECK (Right) Principal Problem:   Right femoral fracture (HCC) Active Problems:   HTN (hypertension), benign   OSA (obstructive sleep apnea)   Atrial flutter (HCC)   CKD (chronic kidney disease) stage 3, GFR 30-59 ml/min (HCC)   COPD (chronic obstructive pulmonary disease) (HCC)   Shortness of breath   Interstitial lung disease (HCC)   Hyponatremia   Lung nodule  Estimated body mass index is 22.45 kg/m as calculated from the following:   Height as of this encounter: 5\' 11"  (1.803 m).   Weight as of this encounter: 73 kg. Advance diet Up with therapy D/C IV fluids when tolerating po intake.  Labs and vitals reviewed this AM. Hg 10.9 this morning, WBC 9.9. Up with therapy today. If he does well with PT stable for d/c from orthopaedic standpoint back to [place].  If he does discharge today, continue Eliquis 2.5mg  twice daily. Follow-up with [facility] in 10-14 days for staple removal.  DVT Prophylaxis - TED hose and Eliquis Weight-Bearing as tolerated to right leg  J. Horris Latino, PA-C [facility] Orthopaedic [facility] 07/03/2023, 7:12 AM

## 2023-07-03 NOTE — Progress Notes (Signed)
Discharge instructions reviewed with patient and son. Small skin tear to left foream due to removal of PIV.  Foam dressing applied prior to patient being discharge.

## 2023-07-03 NOTE — Evaluation (Addendum)
Physical Therapy Evaluation Patient Details Name: Michael Gilbert MRN: [id number] DOB: Jul 12, 1928 Today's Date: 07/03/2023  History of Present Illness  Patient is a 89 y.o. male with medical history significant of atrial fibrillation/flutter on Eliquis, hypertension, CKD stage IIIb, OSA, sick sinus syndrome s/p PPM, who presents to the ED due to hip pain and shortness of breath. Found to have R hip fx, now s/p percutaneous fixation of R femoral neck  Clinical Impression  Pt was pleasant and motivated to participate during the session and put forth good effort throughout. Pt currently able to perform bed mobility Ind and STS transfers with RW at supervision level needing cues for hand placement. Pt amb lap around unit with RW at CGA, needing light cues for AD management but walking with good step pattern, able to self correct posture, good WB tolerance and confidence with mobility. Pt will benefit from continued PT services upon discharge to safely address deficits listed in patient problem list for decreased caregiver assistance and eventual return to PLOF.       If plan is discharge home, recommend the following: A little help with walking and/or transfers;A little help with bathing/dressing/bathroom;Assist for transportation   Can travel by private vehicle        Equipment Recommendations Rolling walker (2 wheels)  Recommendations for Other Services       Functional Status Assessment Patient has had a recent decline in their functional status and demonstrates the ability to make significant improvements in function in a reasonable and predictable amount of time.     Precautions / Restrictions Precautions Precautions: Fall Restrictions Weight Bearing Restrictions: Yes RLE Weight Bearing: Weight bearing as tolerated      Mobility  Bed Mobility Overal bed mobility: Independent                  Transfers Overall transfer level: Needs assistance Equipment used: Rolling  walker (2 wheels) Transfers: Sit to/from Stand Sit to Stand: Supervision           General transfer comment: STS from EOB, cues for hand placement    Ambulation/Gait Ambulation/Gait assistance: Contact guard assist Gait Distance (Feet): 200 Feet Assistive device: Rolling walker (2 wheels) Gait Pattern/deviations: Step-through pattern     General Gait Details: Pt steady walking with RW, noting no imbalance  Stairs            Wheelchair Mobility     Tilt Bed    Modified Rankin (Stroke Patients Only)       Balance Overall balance assessment: Needs assistance Sitting-balance support: Feet supported, No upper extremity supported Sitting balance-Leahy Scale: Normal     Standing balance support: Bilateral upper extremity supported, During functional activity Standing balance-Leahy Scale: Good Standing balance comment: Static standing with RW                             Pertinent Vitals/Pain Pain Assessment Pain Assessment: No/denies pain    Home Living Family/patient expects to be discharged to:: Private residence Living Arrangements: Spouse/significant other Available Help at Discharge: Family;Available 24 hours/day Type of Home: Independent living facility Home Access: Level entry       Home Layout: One level Home Equipment: Grab bars - tub/shower;Grab bars - toilet;Rollator (4 wheels);Cane - single point      Prior Function Prior Level of Function : Independent/Modified Independent             Mobility Comments: Walking without AD community  distances ADLs Comments: Ind and driving     Extremity/Trunk Assessment   Upper Extremity Assessment Upper Extremity Assessment: Overall WFL for tasks assessed    Lower Extremity Assessment Lower Extremity Assessment: Overall WFL for tasks assessed       Communication   Communication Communication: No apparent difficulties  Cognition Arousal: Alert Behavior During Therapy: WFL for  tasks assessed/performed Overall Cognitive Status: Within Functional Limits for tasks assessed                                          General Comments      Exercises Total Joint Exercises Ankle Circles/Pumps: AROM, Both, 10 reps, Strengthening Quad Sets: AROM, Right, 5 reps, Strengthening Hip ABduction/ADduction: AROM, Right, 5 reps, Strengthening (~5lb resistance) Straight Leg Raises: AROM, Both, 5 reps, Strengthening   Assessment/Plan    PT Assessment Patient needs continued PT services  PT Problem List Decreased strength;Decreased range of motion;Decreased activity tolerance;Decreased balance;Decreased mobility;Decreased coordination       PT Treatment Interventions DME instruction;Balance training;Gait training;Stair training;Functional mobility training;Therapeutic activities;Therapeutic exercise    PT Goals (Current goals can be found in the Care Plan section)  Acute Rehab PT Goals Patient Stated Goal: walk better PT Goal Formulation: With patient Time For Goal Achievement: 07/16/23 Potential to Achieve Goals: Good    Frequency BID     Co-evaluation               AM-PAC PT "6 Clicks" Mobility  Outcome Measure Help needed turning from your back to your side while in a flat bed without using bedrails?: None Help needed moving from lying on your back to sitting on the side of a flat bed without using bedrails?: None Help needed moving to and from a bed to a chair (including a wheelchair)?: A Little Help needed standing up from a chair using your arms (e.g., wheelchair or bedside chair)?: A Little Help needed to walk in hospital room?: A Little Help needed climbing 3-5 steps with a railing? : A Little 6 Click Score: 20    End of Session Equipment Utilized During Treatment: Gait belt Activity Tolerance: Patient tolerated treatment well Patient left: in chair;with chair alarm set;with call bell/phone within reach;with family/visitor  present Nurse Communication: Mobility status PT Visit Diagnosis: Other abnormalities of gait and mobility (R26.89)    Time: 1610-9604 PT Time Calculation (min) (ACUTE ONLY): 35 min   Charges:                 Cecile Sheerer, SPT 07/03/23, 10:31 AM This note has been reviewed and this clinician agrees with the information provided.    Loran Senters, DPT

## 2023-07-03 NOTE — Discharge Instructions (Signed)

## 2023-07-03 NOTE — Discharge Summary (Signed)
Physician Discharge Summary  Michael Gilbert:[id number] DOB: 1928-08-23 DOA: 07/01/2023  PCP: [name], MD  Admit date: 07/01/2023 Discharge date: 07/03/2023  Admitted From: Home  Discharge disposition: Independent living facility with outpatient PT.   Recommendations for Outpatient Follow-Up:   Follow up with your primary care provider in one week.  Check CBC, BMP, magnesium in the next visit Nodular opacity noted in the right upper lobe, that will need repeat imaging in 3 months Follow-up recommended with [facility] orthopedics in 10 to 14 days for staple removal.  Discharge Diagnosis:   Principal Problem:   Right femoral fracture (HCC) Active Problems:   Shortness of breath   Interstitial lung disease (HCC)   COPD (chronic obstructive pulmonary disease) (HCC)   Atrial flutter (HCC)   CKD (chronic kidney disease) stage 3, GFR 30-59 ml/min (HCC)   HTN (hypertension), benign   OSA (obstructive sleep apnea)   Hyponatremia   Lung nodule   Discharge Condition: Improved.  Diet recommendation: Regular.  Wound care: None.  Code status: DNR  History of Present Illness:   89 y.o. male with medical history significant of atrial fibrillation/flutter on Eliquis, hypertension, CKD stage IIIb, OSA, sick sinus syndrome s/p PPM admitted for right femoral neck fracture.  Hospital Course:   Following conditions were addressed during hospitalization as listed below,  Right femoral fracture Status post fall. Status post in situ cannulated screw fixation of valgus-impacted right femoral neck fracture on 8/28.  Weightbearing as tolerated.  Follow-up with [facility] orthopedics 10 to 14 days for staple removal.  Outpatient PT recommended.  Shortness of breath likely due to underlying interstitial lung disease COPD  CTA is negative for PE.  He has a history of underlying COPD, in addition to ILD that was first noted in 2021 but has not been worked up. No evidence of pneumonia.  No  evidence of hypervolemia on examination to suggest CHF exacerbation.  Improved at this time.  On room air.  Did not qualify for oxygen.  Will continue albuterol inhaler on discharge.patient had a CT of the chest in 2021 that demonstrated patchy areas of GGO, septal thickening, traction bronchiectasis; categorized as probable UIP.  Overall, likely IPF, however will require formal workup by pulmonology. On personal review of CT today, fibrotic changes seem to have mildly progressed. Will need outpatient referral to pulmonology if this is in line with patient's goals of care.  History of mild to moderate emphysema seen on imaging with clinical diagnosis made by PCP.  No PFTs to confirm obstructive pattern.  Will continue few days of prednisone on discharge.   Atrial flutter (HCC) Continue  Eliquis    CKD (chronic kidney disease) stage 3a GFR 30-59 ml/min (HCC) Patient's baseline creatinine predominantly between 1.3 and 1.8, currently within baseline range.  Creatinine prior to discharge was 1.7.   HTN (hypertension), benign Continue metoprolol.   OSA (obstructive sleep apnea) Continue CPAP at bedtime.   Hyponatremia Chronic hyponatremia dating back to at least 2022.  Stable at this time.  Sodium prior to discharge was 132   Lung nodule Nodular opacity noted in the right upper lobe, that will need repeat imaging in 3 months  Disposition.  At this time, patient is stable for disposition home with outpatient [facility] orthopedics follow-up in 10 to 14 days.  Could benefit from pulmonary follow-up as outpatient.  Medical Consultants:   Orthopedics  Procedures:    In situ cannulation and screw fixation of valgus impacted right femoral neck fracture on  828 Subjective:   Today, patient was seen and examined at bedside.  Ambulating in the hallway.  Did not require any oxygen.  Patient's son at bedside.  Feels good for discharge.  Discharge Exam:   Vitals:   07/03/23 0804 07/03/23 0855  BP:   109/74  Pulse:  64  Resp:  17  Temp:  98.2 F (36.8 C)  SpO2: 96% 96%   Vitals:   07/02/23 2136 07/02/23 2318 07/03/23 0804 07/03/23 0855  BP:  107/65  109/74  Pulse:  (!) 59  64  Resp:  18  17  Temp:  97.7 F (36.5 C)  98.2 F (36.8 C)  SpO2: 96% 98% 96% 96%  Weight:      Height:        General: Alert awake, not in obvious distress, elderly male, HENT: pupils equally reacting to light,  No scleral pallor or icterus noted. Oral mucosa is moist.  Chest:  Clear breath sounds.  No crackles or wheezes.  CVS: S1 &S2 heard. No murmur.  Regular rate and rhythm. Abdomen: Soft, nontender, nondistended.  Bowel sounds are heard.   Extremities: No cyanosis, clubbing or edema.  Surgical dressing on the right leg. Psych: Alert, awake and oriented, normal mood CNS:  No cranial nerve deficits.  Power equal in all extremities.   Skin: Warm and dry.  No rashes noted.  The results of significant diagnostics from this hospitalization (including imaging, microbiology, ancillary and laboratory) are listed below for reference.     Diagnostic Studies:   DG HIP UNILAT WITH PELVIS 2-3 VIEWS RIGHT  Result Date: 07/02/2023 CLINICAL DATA:  Fracture. EXAM: DG HIP (WITH OR WITHOUT PELVIS) 2-3V RIGHT COMPARISON:  Preoperative imaging FINDINGS: Three fluoroscopic spot views of the right hip obtained in the operating room. 3 screws traverse the right femoral neck fracture. Fluoroscopy time 1 minute 32 seconds. Fluoroscopy dose not provided. IMPRESSION: Intraoperative fluoroscopy during pinning of right femoral neck fracture. Electronically Signed   By: Narda Rutherford M.D.   On: 07/02/2023 16:12   DG C-Arm 1-60 Min-No Report  Result Date: 07/02/2023 Fluoroscopy was utilized by the requesting physician.  No radiographic interpretation.   CT Angio Chest PE W and/or Wo Contrast  Result Date: 07/01/2023 CLINICAL DATA:  Short of breath, history of right hip fracture, positive D-dimer EXAM: CT ANGIOGRAPHY  CHEST WITH CONTRAST TECHNIQUE: Multidetector CT imaging of the chest was performed using the standard protocol during bolus administration of intravenous contrast. Multiplanar CT image reconstructions and MIPs were obtained to evaluate the vascular anatomy. RADIATION DOSE REDUCTION: This exam was performed according to the departmental dose-optimization program which includes automated exposure control, adjustment of the mA and/or kV according to patient size and/or use of iterative reconstruction technique. CONTRAST:  60mL OMNIPAQUE IOHEXOL 350 MG/ML SOLN COMPARISON:  07/01/2023, 09/05/2020 FINDINGS: Cardiovascular: This is a technically adequate evaluation of the pulmonary vasculature. No filling defects or pulmonary emboli. The heart is enlarged without pericardial effusion. Lead less pacemaker identified within the right ventricular apex. Normal caliber of the thoracic aorta. Atherosclerosis of the aorta and coronary vasculature. Mediastinum/Nodes: No enlarged mediastinal, hilar, or axillary lymph nodes. Thyroid gland, trachea, and esophagus demonstrate no significant findings. Lungs/Pleura: Upper lobe predominant centrilobular emphysema. Progressive areas of bibasilar scarring and fibrosis. Progressive bilateral bronchiectasis. The central airways are patent. Nodular consolidation within the right upper lobe abuts the minor fissure, measuring 1.6 x 1.5 x 1.7 cm, reference image 76/6. This is new since prior study. No other acute airspace  disease, effusion, or pneumothorax. Upper Abdomen: No acute abnormality. Musculoskeletal: No acute or destructive bony abnormalities. Reconstructed images demonstrate no additional findings. Review of the MIP images confirms the above findings. IMPRESSION: 1. No evidence of pulmonary embolus. 2. Nodular right upper lobe consolidation abutting the minor fissure, new since prior study. Three-month follow-up CT is recommended to assess for resolution. If this persists, follow-up  PET CT could be considered at that time if the patient would be a therapy candidate should neoplasm be detected. 3. Aortic Atherosclerosis (ICD10-I70.0) and Emphysema (ICD10-J43.9). 4. Cardiomegaly, with coronary artery atherosclerosis. Electronically Signed   By: Sharlet Salina M.D.   On: 07/01/2023 18:13   DG Hip Unilat W or Wo Pelvis 2-3 Views Right  Result Date: 07/01/2023 CLINICAL DATA:  sob, right hip pain EXAM: DG HIP (WITH OR WITHOUT PELVIS) 2-3V RIGHT COMPARISON:  09/05/2020 FINDINGS: Impacted mid cervical fracture of the right femoral neck. No dislocation. Bony pelvis intact. Degenerative disc disease L4-5. IMPRESSION: Impacted right femoral neck fracture. Electronically Signed   By: Corlis Leak M.D.   On: 07/01/2023 16:56   DG Chest 1 View  Result Date: 07/01/2023 CLINICAL DATA:  Hip pain, shortness of breath EXAM: CHEST  1 VIEW COMPARISON:  05/18/2016 FINDINGS: Coarse airspace opacities laterally in the right mid lung, new since previous. Prominent alveolar opacities in the lung bases left greater than right, increased since previous. Heart size and mediastinal contours are within normal limits. Aortic Atherosclerosis (ICD10-170.0). No effusion. Visualized bones unremarkable. IMPRESSION: Coarse bilateral airspace opacities as above, increased since 2017 but of indeterminate acuity. Electronically Signed   By: Corlis Leak M.D.   On: 07/01/2023 16:54     Labs:   Basic Metabolic Panel: Recent Labs  Lab 07/01/23 1512 07/02/23 0328 07/02/23 2205  NA 132* 132* 132*  K 4.4 4.7 4.5  CL 101 106 101  CO2 20* 20* 20*  GLUCOSE 101* 112* 123*  BUN 45* 44* 47*  CREATININE 1.75* 1.52* 1.70*  CALCIUM 9.1 8.7* 8.5*   GFR Estimated Creatinine Clearance: 28 mL/min (A) (by C-G formula based on SCr of 1.7 mg/dL (H)). Liver Function Tests: No results for input(s): "AST", "ALT", "ALKPHOS", "BILITOT", "PROT", "ALBUMIN" in the last 168 hours. No results for input(s): "LIPASE", "AMYLASE" in the last  168 hours. No results for input(s): "AMMONIA" in the last 168 hours. Coagulation profile No results for input(s): "INR", "PROTIME" in the last 168 hours.  CBC: Recent Labs  Lab 07/01/23 1512 07/02/23 0328 07/03/23 0336  WBC 7.8 6.6 9.9  HGB 12.6* 11.8* 10.9*  HCT 38.4* 35.2* 32.2*  MCV 90.4 87.3 87.3  PLT 260 239 243   Cardiac Enzymes: No results for input(s): "CKTOTAL", "CKMB", "CKMBINDEX", "TROPONINI" in the last 168 hours. BNP: Invalid input(s): "POCBNP" CBG: Recent Labs  Lab 07/02/23 2055  GLUCAP 133*   D-Dimer Recent Labs    07/01/23 1513  DDIMER 2.61*   Hgb A1c No results for input(s): "HGBA1C" in the last 72 hours. Lipid Profile No results for input(s): "CHOL", "HDL", "LDLCALC", "TRIG", "CHOLHDL", "LDLDIRECT" in the last 72 hours. Thyroid function studies No results for input(s): "TSH", "T4TOTAL", "T3FREE", "THYROIDAB" in the last 72 hours.  Invalid input(s): "FREET3" Anemia work up No results for input(s): "VITAMINB12", "FOLATE", "FERRITIN", "TIBC", "IRON", "RETICCTPCT" in the last 72 hours. Microbiology No results found for this or any previous visit (from the past 240 hour(s)).   Discharge Instructions:   Discharge Instructions     Call MD for:  severe uncontrolled  pain   Complete by: As directed    Call MD for:  temperature >100.4   Complete by: As directed    Diet general   Complete by: As directed    Discharge instructions   Complete by: As directed    Follow-up with your primary care provider in 1 week.  Check blood work at that time.  Seek medical attention for worsening symptoms.  Follow-up with [facility] orthopedics in 10 to 14 days for staple removal.  Weightbearing as tolerated to the right leg.   Increase activity slowly   Complete by: As directed       Allergies as of 07/03/2023       Reactions   Ace Inhibitors Cough   Lovastatin Other (See Comments)   Other reaction(s): Muscle Pain   Simvastatin    Other reaction(s): Muscle Pain         Medication List     STOP taking these medications    clobetasol cream 0.05 % Commonly known as: TEMOVATE   colchicine 0.6 MG tablet   hydrocortisone 2.5 % cream       TAKE these medications    albuterol 108 (90 Base) MCG/ACT inhaler Commonly known as: VENTOLIN HFA Inhale 2 puffs into the lungs every 6 (six) hours as needed for wheezing or shortness of breath.   allopurinol 100 MG tablet Commonly known as: ZYLOPRIM Take 100 mg by mouth daily.   buPROPion 150 MG 24 hr tablet Commonly known as: WELLBUTRIN XL Take 150 mg by mouth daily.   docusate sodium 100 MG capsule Commonly known as: COLACE Take 1 capsule (100 mg total) by mouth 2 (two) times daily.   Eliquis 2.5 MG Tabs tablet Generic drug: apixaban Take 1 tablet (2.5 mg total) by mouth 2 (two) times daily.   fluticasone 50 MCG/ACT nasal spray Commonly known as: FLONASE Place 2 sprays into both nostrils daily.   furosemide 20 MG tablet Commonly known as: LASIX Take 20 mg by mouth daily.   Gemtesa 75 MG Tabs Generic drug: Vibegron Take 75 mg by mouth daily at 2 PM.   losartan 25 MG tablet Commonly known as: COZAAR Take 25 mg by mouth daily.   metoprolol succinate 25 MG 24 hr tablet Commonly known as: TOPROL-XL Take 12.5 mg by mouth daily.   Multi-Vitamins Tabs Take 1 tablet by mouth daily.   omeprazole 40 MG capsule Commonly known as: PRILOSEC Take 40 mg by mouth daily.   oxyCODONE 5 MG immediate release tablet Commonly known as: Oxy IR/ROXICODONE Take 1 tablet (5 mg total) by mouth every 6 (six) hours as needed for severe pain or moderate pain.   predniSONE 10 MG tablet Commonly known as: DELTASONE Take 3 tablets (30 mg total) by mouth daily for 3 days.   tamsulosin 0.4 MG Caps capsule Commonly known as: FLOMAX TAKE 2 CAPSULES EVERY DAY What changed:  how much to take when to take this               Durable Medical Equipment  (From admission, onward)            Start     Ordered   07/03/23 0935  For home use only DME Walker rolling  Once       Question Answer Comment  Walker: With 5 Inch Wheels   Patient needs a walker to treat with the following condition Impaired mobility      07/03/23 0934  Follow-up Information     Anson Oregon, PA-C Follow up in 14 day(s).   Specialty: Physician Assistant Why: Mindi Slicker information: [address] [place] [address] [phone]         [name], MD Follow up in 1 week(s).   Specialty: Family Medicine Contact information: [address] [facility] [place] [address] [phone]                  Time coordinating discharge: 39 minutes  Signed:  Alanni Vader  [facility] 07/03/2023, 9:46 AM

## 2023-07-03 NOTE — TOC Progression Note (Signed)
Transition of Care [facility]) - Progression Note    Patient Details  Name: Michael Gilbert MRN: [id number] Date of Birth: 17-Apr-1928  [facility] CM/SW Contact  Marlowe Sax, RN Phone Number: 07/03/2023, 10:31 AM  Clinical Narrative:    Arranged PT to go to the patient independent apartment at [facility], sent the orders thru the Hub to [place] at [facility] a RW to be delivered to the bedside by [employer]     Barriers to Discharge: No Barriers Identified  Expected Discharge Plan and Services   Discharge Planning Services: CM Consult   Living arrangements for the past 2 months: Apartment Expected Discharge Date: 07/03/23               DME Arranged: Dan Humphreys rolling DME Agency: [employer] Date DME Agency Contacted: 07/03/23 Time DME Agency Contacted: [phone] Representative spoke with at DME Agency: jon HH Arranged: PT HH Agency:  Shona Simpson) Date HH Agency Contacted: 07/03/23       Social Determinants of Health (SDOH) Interventions SDOH Screenings   Food Insecurity: No Food Insecurity (07/02/2023)  Housing: Low Risk  (07/02/2023)  Transportation Needs: No Transportation Needs (07/02/2023)  Utilities: Not At Risk (07/02/2023)  Financial Resource Strain: Patient Declined (05/06/2023)   Received from [facility]  Tobacco Use: Medium Risk (07/02/2023)    Readmission Risk Interventions     No data to display

## 2023-07-10 ENCOUNTER — Ambulatory Visit: Payer: Medicare Other | Admitting: Urology

## 2023-07-10 NOTE — Anesthesia Postprocedure Evaluation (Signed)
Anesthesia Post Note  Patient: Michael Gilbert  Procedure(s) Performed: PERCUTANEOUS FIXATION OF FEMORAL NECK (Right: Hip)  Patient location during evaluation: PACU Anesthesia Type: General Level of consciousness: awake and alert Pain management: pain level controlled Vital Signs Assessment: post-procedure vital signs reviewed and stable Respiratory status: spontaneous breathing, nonlabored ventilation, respiratory function stable and patient connected to nasal cannula oxygen Cardiovascular status: blood pressure returned to baseline and stable Postop Assessment: no apparent nausea or vomiting Anesthetic complications: no   No notable events documented.   Last Vitals:  Vitals:   07/03/23 0804 07/03/23 0855  BP:  109/74  Pulse:  64  Resp:  17  Temp:  36.8 C  SpO2: 96% 96%    Last Pain:  Vitals:   07/03/23 1000  TempSrc:   PainSc: 0-No pain                 Lenard Simmer

## 2023-07-30 ENCOUNTER — Other Ambulatory Visit: Payer: Self-pay | Admitting: Urology

## 2023-09-26 ENCOUNTER — Ambulatory Visit (INDEPENDENT_AMBULATORY_CARE_PROVIDER_SITE_OTHER): Payer: Medicare Other | Admitting: Podiatry

## 2023-09-26 ENCOUNTER — Encounter: Payer: Self-pay | Admitting: Podiatry

## 2023-09-26 VITALS — Ht 71.0 in | Wt 161.0 lb

## 2023-09-26 DIAGNOSIS — B351 Tinea unguium: Secondary | ICD-10-CM | POA: Diagnosis not present

## 2023-09-26 DIAGNOSIS — M79674 Pain in right toe(s): Secondary | ICD-10-CM | POA: Diagnosis not present

## 2023-09-26 DIAGNOSIS — M79675 Pain in left toe(s): Secondary | ICD-10-CM

## 2023-10-03 ENCOUNTER — Other Ambulatory Visit: Payer: Self-pay | Admitting: Urology

## 2023-10-04 NOTE — Progress Notes (Signed)
  Subjective:  Patient ID: Michael Gilbert, male    DOB: 1928/01/23,  MRN: [id number]  89 y.o. male presents to clinic with  at risk foot care with h/o CKD and painful elongated mycotic toenails 1-5 bilaterally which are tender when wearing enclosed shoe gear. Pain is relieved with periodic professional debridement.  Chief Complaint  Patient presents with   Nail Problem    Pt is here for RFC, not a diabetic, PCP is Dr Larwance Sachs and LOV was 2 months ago.     New problem(s): None   PCP is [name], MD.  Allergies  Allergen Reactions   Ace Inhibitors Cough   Lovastatin Other (See Comments)    Other reaction(s): Muscle Pain   Simvastatin     Other reaction(s): Muscle Pain    Review of Systems: Negative except as noted in the HPI.   Objective:  Michael Gilbert is a pleasant 89 y.o. male WD, WN in NAD.Marland Kitchen AAO x 3.  Vascular Examination: Vascular status intact b/l with palpable pedal pulses. CFT immediate b/l. No edema. No pain with calf compression b/l. Skin temperature gradient WNL b/l.   Neurological Examination: Sensation grossly intact b/l with 10 gram monofilament. Vibratory sensation intact b/l.   Dermatological Examination: Pedal skin with normal turgor, texture and tone b/l. Toenails 1-5 b/l thick, discolored, elongated with subungual debris and pain on dorsal palpation. No hyperkeratotic lesions noted b/l.   Musculoskeletal Examination: Muscle strength 5/5 to b/l LE. Hammertoe(s) noted to the 2-4 b/l.  Radiographs: None  Last A1c:       No data to display         Assessment:   1. Pain due to onychomycosis of toenails of both feet     Plan:  Patient was evaluated and treated. All patient's and/or POA's questions/concerns addressed on today's visit. Toenails 1-5 debrided in length and girth without incident. Continue soft, supportive shoe gear daily. Report any pedal injuries to medical professional. Call office if there are any questions/concerns. -Patient/POA  to call should there be question/concern in the interim.  Return in about 4 months (around 01/24/2024).  Freddie Breech, DPM      [place] LOCATION: [address] [place] [address]                   Office [phone]   [facility] LOCATION: [address] [place], [place] [address] Office [phone]

## 2024-01-06 ENCOUNTER — Other Ambulatory Visit: Payer: Self-pay | Admitting: Urology

## 2024-01-23 ENCOUNTER — Encounter: Payer: Self-pay | Admitting: Podiatry

## 2024-01-23 ENCOUNTER — Ambulatory Visit: Payer: Medicare Other | Admitting: Podiatry

## 2024-01-23 VITALS — Ht 71.0 in | Wt 161.0 lb

## 2024-01-23 DIAGNOSIS — B351 Tinea unguium: Secondary | ICD-10-CM

## 2024-01-23 DIAGNOSIS — M79674 Pain in right toe(s): Secondary | ICD-10-CM

## 2024-01-23 DIAGNOSIS — M79675 Pain in left toe(s): Secondary | ICD-10-CM | POA: Diagnosis not present

## 2024-01-30 ENCOUNTER — Encounter: Payer: Self-pay | Admitting: Podiatry

## 2024-01-30 NOTE — Progress Notes (Signed)
  Subjective:  Patient ID: Michael Gilbert, male    DOB: [birth year],  MRN: [id number]  89 y.o. male presents at risk foot care with h/o CKD and painful, elongated thickened toenails x 10 which are symptomatic when wearing enclosed shoe gear. This interferes with his/her daily activities.  Chief Complaint  Patient presents with   Nail Problem    Pt is here for [facility] PCP is Dr Larwance Sachs and LOV was in December.   New problem(s): None   PCP is [name], MD.  Allergies  Allergen Reactions   Ace Inhibitors Cough   Lovastatin Other (See Comments)    Other reaction(s): Muscle Pain   Simvastatin     Other reaction(s): Muscle Pain    Review of Systems: Negative except as noted in the HPI.   Objective:  Michael Gilbert is a pleasant 89 y.o. male WD, WN in NAD. AAO x 3.  Vascular Examination: Vascular status intact b/l with palpable pedal pulses. CFT immediate b/l. Pedal hair present. No edema. No pain with calf compression b/l. Skin temperature gradient WNL b/l. No varicosities noted. No cyanosis or clubbing noted.  Neurological Examination: Sensation grossly intact b/l with 10 gram monofilament. Vibratory sensation intact b/l.  Dermatological Examination: Pedal skin with normal turgor, texture and tone b/l. No open wounds nor interdigital macerations noted. Toenails 1-5 b/l thick, discolored, elongated with subungual debris and pain on dorsal palpation. No hyperkeratotic lesions noted b/l.   Musculoskeletal Examination: Muscle strength 5/5 to b/l LE.  No pain, crepitus noted b/l. No gross pedal deformities. Patient ambulates independently without assistive aids.   Radiographs: None  Last A1c:       No data to display           Assessment:   1. Pain due to onychomycosis of toenails of both feet    Plan:  Consent given for treatment. Patient examined. All patient's and/or POA's questions/concerns addressed on today's visit. Mycotic toenails 1-5 debrided in length and girth  without incident. Continue soft, supportive shoe gear daily. Report any pedal injuries to medical professional. Call office if there are any quesitons/concerns. -Patient/POA to call should there be question/concern in the interim.  Return in about 3 months (around 04/24/2024).  Freddie Breech, DPM      [place] LOCATION: [address], [place] [address]                   Office [phone]   [facility] LOCATION: [address] [place], [place] [address] Office [phone]

## 2024-02-17 ENCOUNTER — Other Ambulatory Visit: Payer: Self-pay | Admitting: Urology

## 2024-04-17 ENCOUNTER — Other Ambulatory Visit: Payer: Self-pay | Admitting: Urology

## 2024-04-26 ENCOUNTER — Ambulatory Visit (INDEPENDENT_AMBULATORY_CARE_PROVIDER_SITE_OTHER): Admitting: Podiatry

## 2024-04-26 ENCOUNTER — Encounter: Payer: Self-pay | Admitting: Podiatry

## 2024-04-26 DIAGNOSIS — M79674 Pain in right toe(s): Secondary | ICD-10-CM | POA: Diagnosis not present

## 2024-04-26 DIAGNOSIS — D689 Coagulation defect, unspecified: Secondary | ICD-10-CM

## 2024-04-26 DIAGNOSIS — B351 Tinea unguium: Secondary | ICD-10-CM

## 2024-04-26 DIAGNOSIS — M79675 Pain in left toe(s): Secondary | ICD-10-CM

## 2024-04-26 DIAGNOSIS — G608 Other hereditary and idiopathic neuropathies: Secondary | ICD-10-CM

## 2024-04-26 NOTE — Patient Instructions (Signed)
   For dry or cracked skin, recommend daily use of Bag Balm Hand and Body Moistuizer which may be purchased at local drug store or on [employer].

## 2024-04-30 NOTE — Progress Notes (Signed)
  Subjective:  Patient ID: Michael Gilbert, male    DOB: 20-Jun-1928,  MRN: [id number]  Michael Gilbert presents to clinic today for at risk foot care. Patient has history of coagulation defect and polyneuropathy and painful thick toenails that are difficult to trim. Pain interferes with ambulation. Aggravating factors include wearing enclosed shoe gear. Pain is relieved with periodic professional debridement. He is followed by Neurology for b/l LE neuropathy. Chief Complaint  Patient presents with   RFC    Rm3 RFC/not diabetic/eliquis /Dr. Diedra last visit May 2025   New problem(s): None.   PCP is Diedra Lame, MD.  Allergies  Allergen Reactions   Ace Inhibitors Cough   Lovastatin Other (See Comments)    Other reaction(s): Muscle Pain   Simvastatin     Other reaction(s): Muscle Pain    Review of Systems: Negative except as noted in the HPI.  Objective: No changes noted in today's physical examination. There were no vitals filed for this visit. Michael Gilbert is a pleasant 89 y.o. male WD, WN in NAD. AAO x 3.  Vascular Examination: Capillary refill time immediate b/l. Palpable pedal pulses. Pedal hair present b/l. No pain with calf compression b/l. Skin temperature gradient WNL b/l. No cyanosis or clubbing b/l. No ischemia or gangrene noted b/l. No varicosities noted.  Neurological Examination: Pt has subjective symptoms of neuropathy. Sensation grossly intact b/l with 10 gram monofilament. Vibratory sensation intact b/l.   Dermatological Examination: Mild dry patch left heel.  No open wounds. No interdigital macerations.   Toenails 1-5 b/l thick, discolored, elongated with subungual debris and pain on dorsal palpation.   No corns, calluses nor porokeratotic lesions noted.  Musculoskeletal Examination: Normal muscle strength 5/5 to all lower extremity muscle groups bilaterally. No pain, crepitus or joint limitation noted with ROM b/l LE. No gross bony pedal deformities b/l.  Patient ambulates independently without assistive aids.  Radiographs: None  Assessment/Plan: 1. Pain due to onychomycosis of toenails of both feet   2. Coagulation defect (HCC)   3. Peripheral sensory-motor axonal polyneuropathy     Patient was evaluated and treated. All patient's and/or POA's questions/concerns addressed on today's visit. Toenails 1-5 debrided in length and girth without incident. Continue soft, supportive shoe gear daily. Report any pedal injuries to medical professional. Call office if there are any questions/concerns. For dry skin, recommended daily use of Bag Balm Hand and Body Moistuizer which may be purchased at local drug store or on [employer]. Patient/POA to call should there be question/concern in the interim.  Return in about 3 months (around 07/27/2024).   Delon LITTIE Merlin, DPM      [place] LOCATION: [address], [place] [address]                   Office [phone]   [facility] LOCATION: [address] [place], [place] [address] Office [phone]

## 2024-08-05 ENCOUNTER — Ambulatory Visit: Admitting: Podiatry

## 2024-08-05 DIAGNOSIS — Z91199 Patient's noncompliance with other medical treatment and regimen due to unspecified reason: Secondary | ICD-10-CM

## 2024-08-05 NOTE — Progress Notes (Signed)
 1. No-show for appointment

## 2024-08-13 ENCOUNTER — Ambulatory Visit (INDEPENDENT_AMBULATORY_CARE_PROVIDER_SITE_OTHER): Admitting: Podiatry

## 2024-08-13 DIAGNOSIS — G608 Other hereditary and idiopathic neuropathies: Secondary | ICD-10-CM

## 2024-08-13 DIAGNOSIS — M79675 Pain in left toe(s): Secondary | ICD-10-CM

## 2024-08-13 DIAGNOSIS — M79674 Pain in right toe(s): Secondary | ICD-10-CM | POA: Diagnosis not present

## 2024-08-13 DIAGNOSIS — D689 Coagulation defect, unspecified: Secondary | ICD-10-CM | POA: Diagnosis not present

## 2024-08-13 DIAGNOSIS — B351 Tinea unguium: Secondary | ICD-10-CM

## 2024-08-21 ENCOUNTER — Encounter: Payer: Self-pay | Admitting: Podiatry

## 2024-08-21 NOTE — Progress Notes (Signed)
  Subjective:  Patient ID: Michael Gilbert, male    DOB: [birth year],  MRN: [id number]  [name] presents to clinic today for at risk foot care. Patient has history of coagulation defect and polyneuropathy and painful thick toenails that are difficult to trim. Pain interferes with ambulation. Aggravating factors include wearing enclosed shoe gear. Pain is relieved with periodic professional debridement.  Chief Complaint  Patient presents with   Toe Pain    RFC. Dr. Diedra is his PCP.  Last visit was in July   New problem(s): None.   PCP is Diedra Lame, MD.  Allergies  Allergen Reactions   Ace Inhibitors Cough   Lovastatin Other (See Comments)    Other reaction(s): Muscle Pain   Simvastatin     Other reaction(s): Muscle Pain    Review of Systems: Negative except as noted in the HPI.  Objective: No changes noted in today's physical examination. There were no vitals filed for this visit. Michael Gilbert is a pleasant 89 y.o. male WD, WN in NAD. AAO x 3. Vascular Examination: Capillary refill time immediate b/l. Palpable pedal pulses. Pedal hair present b/l. No pain with calf compression b/l. Skin temperature gradient WNL b/l. No cyanosis or clubbing b/l. No ischemia or gangrene noted b/l. No varicosities noted.  Neurological Examination: Pt has subjective symptoms of neuropathy. Sensation grossly intact b/l with 10 gram monofilament. Vibratory sensation intact b/l.   Dermatological Examination: Mild dry patch left heel.  No open wounds. No interdigital macerations.   Toenails 1-5 b/l thick, discolored, elongated with subungual debris and pain on dorsal palpation.   No corns, calluses nor porokeratotic lesions noted.  Musculoskeletal Examination: Normal muscle strength 5/5 to all lower extremity muscle groups bilaterally. No pain, crepitus or joint limitation noted with ROM b/l LE. No gross bony pedal deformities b/l. Patient ambulates independently without assistive  aids.  Radiographs: None  Assessment/Plan: 1. Pain due to onychomycosis of toenails of both feet   2. Coagulation defect   3. Peripheral sensory-motor axonal polyneuropathy   Consent given for treatment. Patient examined. All patient's and/or POA's questions/concerns addressed on today's visit. Mycotic toenails 1-5 debrided in length and girth without incident. Continue soft, supportive shoe gear daily. Report any pedal injuries to medical professional. Call office if there are any quesitons/concerns. -Patient/POA to call should there be question/concern in the interim.   No follow-ups on file.  Michael Gilbert, DPM      [facility] LOCATION: [address] [place] [address]                   Office [phone]   [facility] LOCATION: [address] [place], [place] [address] Office [phone]

## 2024-08-23 ENCOUNTER — Other Ambulatory Visit: Payer: Self-pay | Admitting: Urology

## 2024-09-14 ENCOUNTER — Other Ambulatory Visit: Payer: Self-pay | Admitting: Urology

## 2024-09-22 ENCOUNTER — Ambulatory Visit (INDEPENDENT_AMBULATORY_CARE_PROVIDER_SITE_OTHER): Admitting: Physician Assistant

## 2024-09-22 VITALS — BP 115/57 | HR 81

## 2024-09-22 DIAGNOSIS — R350 Frequency of micturition: Secondary | ICD-10-CM | POA: Diagnosis not present

## 2024-09-22 DIAGNOSIS — N401 Enlarged prostate with lower urinary tract symptoms: Secondary | ICD-10-CM | POA: Diagnosis not present

## 2024-09-22 LAB — BLADDER SCAN AMB NON-IMAGING: Scan Result: 105

## 2024-09-22 MED ORDER — TAMSULOSIN HCL 0.4 MG PO CAPS
0.4000 mg | ORAL_CAPSULE | Freq: Every day | ORAL | 11 refills | Status: AC
Start: 2024-09-22 — End: ?

## 2024-09-22 NOTE — Progress Notes (Signed)
 89 year old male with a history of BPH with LUTS previously on Flomax .  He presented today with reports of difficulty urinating and frequency.  UA bland, PVR 105 mL.  He suspects he needs to resume Flomax , as he has been off of it for quite some time.  He denies orthostasis or falls.  He had to leave early today due to an extended wait.  Will send in Flomax  0.4 mg daily (previously on 0.8 mg daily) and see him back in 2 weeks with IPSS and PVR.  Can increase his dose at that time if needed.  Yoanna Jurczyk, PA-C  09/22/24 5:20 PM

## 2024-09-23 LAB — URINALYSIS, COMPLETE
Bilirubin, UA: NEGATIVE
Glucose, UA: NEGATIVE
Ketones, UA: NEGATIVE
Leukocytes,UA: NEGATIVE
Nitrite, UA: NEGATIVE
Protein,UA: NEGATIVE
RBC, UA: NEGATIVE
Specific Gravity, UA: 1.02 (ref 1.005–1.030)
Urobilinogen, Ur: 0.2 mg/dL (ref 0.2–1.0)
pH, UA: 5.5 (ref 5.0–7.5)

## 2024-09-23 LAB — MICROSCOPIC EXAMINATION

## 2024-10-05 ENCOUNTER — Ambulatory Visit: Admitting: Physician Assistant

## 2024-10-12 ENCOUNTER — Encounter: Payer: Self-pay | Admitting: Physician Assistant

## 2024-10-12 ENCOUNTER — Ambulatory Visit (INDEPENDENT_AMBULATORY_CARE_PROVIDER_SITE_OTHER): Admitting: Physician Assistant

## 2024-10-12 VITALS — BP 117/62 | HR 94 | Ht 71.0 in | Wt 167.2 lb

## 2024-10-12 DIAGNOSIS — R350 Frequency of micturition: Secondary | ICD-10-CM

## 2024-10-12 DIAGNOSIS — N401 Enlarged prostate with lower urinary tract symptoms: Secondary | ICD-10-CM

## 2024-10-12 LAB — BLADDER SCAN AMB NON-IMAGING

## 2024-10-12 NOTE — Progress Notes (Signed)
 10/12/2024 11:41 AM   Michael Gilbert Jul 01, 1928 [id number]  CC: Chief Complaint  Patient presents with   Benign prostatic hyperplasia with urinary frequency   HPI: Michael Gilbert is a 89 y.o. male with PMH BPH with urinary frequency off pharmacotherapy who presents today for evaluation of LUTS.   Today he reports he has been off Flomax  for about 2 years.  He was taking 0.8 mg daily.  He is not sure why he stopped it, but thinks it may have had to do with inefficacy.  Per chart review, he also previously took beta 3 agonists with limited improvement in his symptoms.  He never took finasteride.  IPSS 24/mostly satisfied as below.  Bladder scan 166 mL, last voided 30 to 40 minutes prior.  He denies dysuria or gross hematuria.  His urinary frequency is most bothersome to him, and he can go as often as every 45 minutes on a bad day.  He has chronic nocturia x 2-3.   IPSS     Row Name 10/12/24 1100         International Prostate Symptom Score   How often have you had the sensation of not emptying your bladder? Less than half the time     How often have you had to urinate less than every two hours? Less than half the time     How often have you found you stopped and started again several times when you urinated? Almost always     How often have you found it difficult to postpone urination? More than half the time     How often have you had a weak urinary stream? Almost always     How often have you had to strain to start urination? More than half the time     How many times did you typically get up at night to urinate? 2 Times     Total IPSS Score 24       Quality of Life due to urinary symptoms   If you were to spend the rest of your life with your urinary condition just the way it is now how would you feel about that? Mostly Disatisfied         PMH: Past Medical History:  Diagnosis Date   Allergy    Benign essential hypertension 06/23/2014   Bradycardia    Colorectal polyps     Diverticulosis    GERD (gastroesophageal reflux disease)    Hypertension     Surgical History: Past Surgical History:  Procedure Laterality Date   CATARACT EXTRACTION     COLONOSCOPY WITH PROPOFOL  N/A 12/06/2017   Procedure: COLONOSCOPY WITH PROPOFOL ;  Surgeon: Unk Corinn Skiff, MD;  Location: [facility];  Service: Gastroenterology;  Laterality: N/A;   ESOPHAGOGASTRODUODENOSCOPY (EGD) WITH PROPOFOL  N/A 01/01/2018   Procedure: ESOPHAGOGASTRODUODENOSCOPY (EGD) WITH PROPOFOL ;  Surgeon: Unk Corinn Skiff, MD;  Location: [facility] ENDOSCOPY;  Service: Gastroenterology;  Laterality: N/A;   HIP PINNING,CANNULATED Right 07/02/2023   Procedure: PERCUTANEOUS FIXATION OF FEMORAL NECK;  Surgeon: Edie Norleen PARAS, MD;  Location: [facility] ORS;  Service: Orthopedics;  Laterality: Right;   PACEMAKER LEADLESS INSERTION N/A 09/07/2020   Procedure: PACEMAKER LEADLESS INSERTION;  Surgeon: Ammon Blunt, MD;  Location: [facility] INV[facility]IVE [facility] LAB;  Service: Cardiovascular;  Laterality: N/A;   TONSILECTOMY, ADENOIDECTOMY, BILATERAL MYRINGOTOMY AND TUBES      Home Medications:  Allergies as of 10/12/2024       Reactions   Ace Inhibitors Cough   Lovastatin Other (  See Comments)   Other reaction(s): Muscle Pain   Simvastatin    Other reaction(s): Muscle Pain        Medication List        Accurate as of October 12, 2024 11:41 AM. If you have any questions, ask your nurse or doctor.          STOP taking these medications    Eliquis  2.5 MG Tabs tablet Generic drug: apixaban  Stopped by: Lucie Hones   Gemtesa  75 MG Tabs Generic drug: Vibegron  Stopped by: Lucie Hones   oxyCODONE  5 MG immediate release tablet Commonly known as: Oxy IR/ROXICODONE  Stopped by: Lucie Hones       TAKE these medications    albuterol  108 (90 Base) MCG/ACT inhaler Commonly known as: VENTOLIN  HFA Inhale 2 puffs into the lungs every 6 (six) hours as needed for wheezing or shortness of  breath.   allopurinol  100 MG tablet Commonly known as: ZYLOPRIM  Take 100 mg by mouth daily.   buPROPion  150 MG 24 hr tablet Commonly known as: WELLBUTRIN  XL Take 150 mg by mouth daily.   docusate sodium  100 MG capsule Commonly known as: COLACE Take 1 capsule (100 mg total) by mouth 2 (two) times daily.   fluticasone  50 MCG/ACT nasal spray Commonly known as: FLONASE  Place 2 sprays into both nostrils daily.   furosemide 20 MG tablet Commonly known as: LASIX Take 20 mg by mouth daily.   losartan  25 MG tablet Commonly known as: COZAAR  Take 25 mg by mouth daily.   metoprolol  succinate 25 MG 24 hr tablet Commonly known as: TOPROL -XL Take 12.5 mg by mouth daily.   Multi-Vitamins Tabs Take 1 tablet by mouth daily.   omeprazole 40 MG capsule Commonly known as: PRILOSEC Take 40 mg by mouth daily.   sildenafil 20 MG tablet Commonly known as: REVATIO Take 20 mg by mouth.   tamsulosin  0.4 MG Caps capsule Commonly known as: FLOMAX  Take 1 capsule (0.4 mg total) by mouth daily.   Trelegy Ellipta 200-62.5-25 MCG/ACT Aepb Generic drug: Fluticasone -Umeclidin-Vilant Inhale 1 puff into the lungs daily.        Allergies:  Allergies  Allergen Reactions   Ace Inhibitors Cough   Lovastatin Other (See Comments)    Other reaction(s): Muscle Pain   Simvastatin     Other reaction(s): Muscle Pain    Family History: Family History  Problem Relation Age of Onset   Diabetes Father    Diabetes Brother    Bladder Cancer Neg Hx    Kidney cancer Neg Hx    Prostate cancer Neg Hx     Social History:   reports that he has quit smoking. He has never used smokeless tobacco. He reports that he does not currently use alcohol after a past usage of about 1.0 standard drink of alcohol per week. He reports that he does not use drugs.  Physical Exam: BP 117/62   Pulse 94   Ht 5' 11 (1.803 m)   Wt 167 lb 3.2 oz (75.8 kg)   BMI 23.32 kg/m   Constitutional:  Alert and oriented, no  acute distress, nontoxic appearing HEENT: [place], AT Cardiovascular: No clubbing, cyanosis, or edema Respiratory: Normal respiratory effort, no increased work of breathing Skin: No rashes, bruises or suspicious lesions Neurologic: Grossly intact, no focal deficits, moving all 4 extremities Psychiatric: Normal mood and affect  Laboratory Data: Results for orders placed or performed in visit on 10/12/24  Bladder Scan (Post Void Residual) in office   Collection Time:  10/12/24 11:15 AM  Result Value Ref Range   Scan Result    Assessment & Plan:   1. Benign prostatic hyperplasia with urinary frequency (Primary) Resume Flomax  0.4 mg daily (prescribed 2 weeks ago).  We will call him in 3 days to recheck his symptoms.  If he is tolerating it without orthostasis and seeing improvement, will increase to 0.8 mg daily.  I will see him back in 1 month for repeat IPSS/PVR on Flomax .  Can consider augmenting with OAB meds or finasteride at that time based on symptom improvement. - Bladder Scan (Post Void Residual) in office  Return in about 4 weeks (around 11/09/2024) for IPSS/PVR.  Lucie Hones, PA-C  [facility] [address] [place], [place] [address] [phone]

## 2024-10-12 NOTE — Patient Instructions (Addendum)
 Resume Flomax  once daily at dinnertime. We'll call you Friday to check on you and then may increase you to twice daily based on how you're doing.

## 2024-10-15 ENCOUNTER — Telehealth: Payer: Self-pay | Admitting: Physician Assistant

## 2024-10-15 NOTE — Telephone Encounter (Signed)
 I spoke with the patient via telephone.  He is doing well after resuming Flomax  and tolerating it without orthostasis.  He prefers to stick with 0.4 mg daily for now.  Will keep plans for follow-up in a month.

## 2024-11-12 ENCOUNTER — Ambulatory Visit (INDEPENDENT_AMBULATORY_CARE_PROVIDER_SITE_OTHER): Admitting: Physician Assistant

## 2024-11-12 VITALS — BP 119/68 | HR 80 | Ht 71.0 in | Wt 159.0 lb

## 2024-11-12 DIAGNOSIS — N401 Enlarged prostate with lower urinary tract symptoms: Secondary | ICD-10-CM

## 2024-11-12 DIAGNOSIS — R350 Frequency of micturition: Secondary | ICD-10-CM

## 2024-11-12 LAB — BLADDER SCAN AMB NON-IMAGING

## 2024-11-12 NOTE — Progress Notes (Signed)
 "  11/12/2024 9:51 AM   Michael Gilbert 16-Sep-1928 [id number]  CC: Chief Complaint  Patient presents with   Benign Prostatic Hypertrophy   HPI: Michael Gilbert is a 89 y.o. male with PMH BPH with urinary frequency who presents today for follow-up after resuming Flomax  0.4 mg daily.   Today he reports he is taking Flomax  once daily and has noticed significant improvement in his voiding symptoms.  He prefers to stay at this dose and defer augmenting to 0.8 mg daily for now.  He also takes Lasix in the mornings and understands this contributes to urinary frequency.  IPSS 20/mixed as below, previously 24/mostly satisfied.  PVR 89 mL, previously 166 mL.   IPSS     Row Name 11/12/24 0900         International Prostate Symptom Score   How often have you had the sensation of not emptying your bladder? Less than half the time     How often have you had to urinate less than every two hours? About half the time     How often have you found you stopped and started again several times when you urinated? More than half the time     How often have you found it difficult to postpone urination? Less than half the time     How often have you had a weak urinary stream? More than half the time     How often have you had to strain to start urination? About half the time     How many times did you typically get up at night to urinate? 2 Times     Total IPSS Score 20       Quality of Life due to urinary symptoms   If you were to spend the rest of your life with your urinary condition just the way it is now how would you feel about that? Mixed         PMH: Past Medical History:  Diagnosis Date   Allergy    Benign essential hypertension 06/23/2014   Bradycardia    Colorectal polyps    Diverticulosis    GERD (gastroesophageal reflux disease)    Hypertension     Surgical History: Past Surgical History:  Procedure Laterality Date   CATARACT EXTRACTION     COLONOSCOPY WITH PROPOFOL  N/A 12/06/2017    Procedure: COLONOSCOPY WITH PROPOFOL ;  Surgeon: Unk Corinn Skiff, MD;  Location: [facility];  Service: Gastroenterology;  Laterality: N/A;   ESOPHAGOGASTRODUODENOSCOPY (EGD) WITH PROPOFOL  N/A 01/01/2018   Procedure: ESOPHAGOGASTRODUODENOSCOPY (EGD) WITH PROPOFOL ;  Surgeon: Unk Corinn Skiff, MD;  Location: [facility];  Service: Gastroenterology;  Laterality: N/A;   HIP PINNING,CANNULATED Right 07/02/2023   Procedure: PERCUTANEOUS FIXATION OF FEMORAL NECK;  Surgeon: Edie Norleen PARAS, MD;  Location: [facility] ORS;  Service: Orthopedics;  Laterality: Right;   PACEMAKER LEADLESS INSERTION N/A 09/07/2020   Procedure: PACEMAKER LEADLESS INSERTION;  Surgeon: Ammon Blunt, MD;  Location: [facility];  Service: Cardiovascular;  Laterality: N/A;   TONSILECTOMY, ADENOIDECTOMY, BILATERAL MYRINGOTOMY AND TUBES      Home Medications:  Allergies as of 11/12/2024       Reactions   Ace Inhibitors Cough   Lovastatin Other (See Comments)   Other reaction(s): Muscle Pain   Simvastatin    Other reaction(s): Muscle Pain        Medication List        Accurate as of November 12, 2024  9:51 AM. If you have any  questions, ask your nurse or doctor.          STOP taking these medications    Trelegy Ellipta 200-62.5-25 MCG/ACT Aepb Generic drug: Fluticasone -Umeclidin-Vilant       TAKE these medications    albuterol  108 (90 Base) MCG/ACT inhaler Commonly known as: VENTOLIN  HFA Inhale 2 puffs into the lungs every 6 (six) hours as needed for wheezing or shortness of breath.   allopurinol  100 MG tablet Commonly known as: ZYLOPRIM  Take 100 mg by mouth daily.   buPROPion  150 MG 24 hr tablet Commonly known as: WELLBUTRIN  XL Take 150 mg by mouth daily.   docusate sodium  100 MG capsule Commonly known as: COLACE Take 1 capsule (100 mg total) by mouth 2 (two) times daily.   fluticasone  50 MCG/ACT nasal spray Commonly known as: FLONASE  Place 2 sprays into both nostrils daily.    furosemide 20 MG tablet Commonly known as: LASIX Take 20 mg by mouth daily.   losartan  25 MG tablet Commonly known as: COZAAR  Take 25 mg by mouth daily.   metoprolol  succinate 25 MG 24 hr tablet Commonly known as: TOPROL -XL Take 12.5 mg by mouth daily.   Multi-Vitamins Tabs Take 1 tablet by mouth daily.   omeprazole 40 MG capsule Commonly known as: PRILOSEC Take 40 mg by mouth daily.   sildenafil 20 MG tablet Commonly known as: REVATIO Take 20 mg by mouth. What changed: when to take this   tamsulosin  0.4 MG Caps capsule Commonly known as: FLOMAX  Take 1 capsule (0.4 mg total) by mouth daily.        Allergies:  Allergies[1]  Family History: Family History  Problem Relation Age of Onset   Diabetes Father    Diabetes Brother    Bladder Cancer Neg Hx    Kidney cancer Neg Hx    Prostate cancer Neg Hx     Social History:   reports that he has quit smoking. He has never used smokeless tobacco. He reports that he does not currently use alcohol after a past usage of about 1.0 standard drink of alcohol per week. He reports that he does not use drugs.  Physical Exam: BP 119/68 (BP Location: Left Arm, Patient Position: Sitting, Cuff Size: Normal)   Pulse 80   Ht 5' 11 (1.803 m)   Wt 159 lb (72.1 kg)   SpO2 90%   BMI 22.18 kg/m   Constitutional:  Alert and oriented, no acute distress, nontoxic appearing HEENT: [place], AT Cardiovascular: No clubbing, cyanosis, or edema Respiratory: Normal respiratory effort, no increased work of breathing Skin: No rashes, bruises or suspicious lesions Neurologic: Grossly intact, no focal deficits, moving all 4 extremities Psychiatric: Normal mood and affect  Laboratory Data: Results for orders placed or performed in visit on 11/12/24  Bladder Scan (Post Void Residual) in office   Collection Time: 11/12/24  9:33 AM  Result Value Ref Range   Scan Result 89ml    Assessment & Plan:   1. Benign prostatic hyperplasia with urinary  frequency (Primary) Well-controlled on Flomax  0.4 mg daily.  He is emptying appropriately.  Will continue this.  We discussed that if his symptoms worsen in the future, we can augment to 0.8 mg daily. - Bladder Scan (Post Void Residual) in office   Return in about 6 months (around 05/12/2025) for IPSS/PVR.  Lucie Hones, PA-C  [facility] [place] [address], [address] [place], [place] [address] [phone]     [1]  Allergies Allergen Reactions  Ace Inhibitors Cough   Lovastatin Other (See Comments)    Other reaction(s): Muscle Pain   Simvastatin     Other reaction(s): Muscle Pain   "

## 2024-11-15 ENCOUNTER — Encounter: Payer: Self-pay | Admitting: Podiatry

## 2024-11-15 ENCOUNTER — Ambulatory Visit: Admitting: Podiatry

## 2024-11-15 DIAGNOSIS — B351 Tinea unguium: Secondary | ICD-10-CM | POA: Diagnosis not present

## 2024-11-15 DIAGNOSIS — M79674 Pain in right toe(s): Secondary | ICD-10-CM

## 2024-11-15 DIAGNOSIS — D689 Coagulation defect, unspecified: Secondary | ICD-10-CM | POA: Diagnosis not present

## 2024-11-15 DIAGNOSIS — M79675 Pain in left toe(s): Secondary | ICD-10-CM

## 2024-11-15 DIAGNOSIS — G608 Other hereditary and idiopathic neuropathies: Secondary | ICD-10-CM | POA: Diagnosis not present

## 2024-11-22 NOTE — Progress Notes (Signed)
"  °  Subjective:  Patient ID: Michael Gilbert, male    DOB: 12/12/1927,  MRN: [id number]  Michael Gilbert presents to clinic today for at risk foot care with h/o coagulation defect, peripheral neuropathyand painful mycotic toenails of both feet that are difficult to trim. Pain interferes with daily activities and wearing enclosed shoe gear comfortably.  Chief Complaint  Patient presents with   Nail Problem    He denies being diabetic. He saw Dr. Diedra this month   New problem(s): None.   PCP is Diedra Lame, MD.  Allergies[1]  Review of Systems: Negative except as noted in the HPI.  Objective: No changes noted in today's physical examination. There were no vitals filed for this visit. Michael Gilbert is a pleasant 89 y.o. male WD, WN in NAD. AAO x 3.  Vascular Examination: Capillary refill time immediate b/l. Palpable pedal pulses. Pedal hair present b/l. No pain with calf compression b/l. Skin temperature gradient WNL b/l. No cyanosis or clubbing b/l. No ischemia or gangrene noted b/l. No varicosities noted.  Neurological Examination: Pt has subjective symptoms of neuropathy. Sensation grossly intact b/l with 10 gram monofilament. Vibratory sensation intact b/l.   Dermatological Examination: Mild dry patch left heel.  No open wounds. No interdigital macerations.   Toenails 1-5 b/l thick, discolored, elongated with subungual debris and pain on dorsal palpation.   No corns, calluses nor porokeratotic lesions noted.  Musculoskeletal Examination: Normal muscle strength 5/5 to all lower extremity muscle groups bilaterally. No pain, crepitus or joint limitation noted with ROM b/l LE. No gross bony pedal deformities b/l. Patient ambulates with cane assistance.  Radiographs: None  Assessment/Plan: 1. Pain due to onychomycosis of toenails of both feet   2. Coagulation defect   3. Peripheral sensory-motor axonal polyneuropathy   Patient was evaluated and treated. All patient's and/or  POA's questions/concerns addressed on today's visit. Toenails 1-5 b/l debrided in length and girth without incident. Continue soft, supportive shoe gear daily. Report any pedal injuries to medical professional. Call office if there are any questions/concerns. -Patient/POA to call should there be question/concern in the interim.   Return in about 4 months (around 03/15/2025).  Delon LITTIE Merlin, DPM      [place] LOCATION: [address] [place] [address]                   Office [phone]   [place] LOCATION: [address] [place], [place] [address] Office [phone]     [1]  Allergies Allergen Reactions   Ace Inhibitors Cough   Lovastatin Other (See Comments)    Other reaction(s): Muscle Pain   Simvastatin     Other reaction(s): Muscle Pain   "

## 2025-03-17 ENCOUNTER — Ambulatory Visit: Admitting: Podiatry

## 2025-05-12 ENCOUNTER — Ambulatory Visit: Admitting: Physician Assistant
